# Patient Record
Sex: Male | Born: 1950 | Race: White | Hispanic: No | Marital: Married | Smoking: Former smoker
Health system: Southern US, Community
[De-identification: ages and names within clinical notes are randomized; demographics above are authoritative.]

## PROBLEM LIST (undated history)

## (undated) DIAGNOSIS — N390 Urinary tract infection, site not specified: Secondary | ICD-10-CM

## (undated) DIAGNOSIS — I248 Other forms of acute ischemic heart disease: Secondary | ICD-10-CM

## (undated) DIAGNOSIS — Z87891 Personal history of nicotine dependence: Secondary | ICD-10-CM

## (undated) DIAGNOSIS — K219 Gastro-esophageal reflux disease without esophagitis: Secondary | ICD-10-CM

## (undated) DIAGNOSIS — I2489 Other forms of acute ischemic heart disease: Secondary | ICD-10-CM

## (undated) DIAGNOSIS — N400 Enlarged prostate without lower urinary tract symptoms: Secondary | ICD-10-CM

## (undated) DIAGNOSIS — R1310 Dysphagia, unspecified: Secondary | ICD-10-CM

## (undated) DIAGNOSIS — F1010 Alcohol abuse, uncomplicated: Secondary | ICD-10-CM

## (undated) DIAGNOSIS — Z87898 Personal history of other specified conditions: Secondary | ICD-10-CM

## (undated) DIAGNOSIS — R9439 Abnormal result of other cardiovascular function study: Secondary | ICD-10-CM

## (undated) DIAGNOSIS — I10 Essential (primary) hypertension: Secondary | ICD-10-CM

## (undated) DIAGNOSIS — N189 Chronic kidney disease, unspecified: Secondary | ICD-10-CM

## (undated) HISTORY — PX: CATARACT EXTRACTION: SUR2

## (undated) HISTORY — PX: OTHER SURGICAL HISTORY: SHX169

## (undated) HISTORY — PX: ROTATOR CUFF REPAIR: SHX139

## (undated) HISTORY — DX: Dysphagia, unspecified: R13.10

## (undated) HISTORY — DX: Essential (primary) hypertension: I10

---
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
## 2017-07-15 ENCOUNTER — Encounter (INDEPENDENT_AMBULATORY_CARE_PROVIDER_SITE_OTHER): Payer: Self-pay

## 2017-07-15 ENCOUNTER — Encounter (INDEPENDENT_AMBULATORY_CARE_PROVIDER_SITE_OTHER): Payer: Self-pay | Admitting: Internal Medicine

## 2017-07-24 ENCOUNTER — Encounter (INDEPENDENT_AMBULATORY_CARE_PROVIDER_SITE_OTHER): Payer: Self-pay | Admitting: Internal Medicine

## 2017-07-24 ENCOUNTER — Other Ambulatory Visit (INDEPENDENT_AMBULATORY_CARE_PROVIDER_SITE_OTHER): Payer: Self-pay | Admitting: Internal Medicine

## 2017-07-24 ENCOUNTER — Ambulatory Visit (INDEPENDENT_AMBULATORY_CARE_PROVIDER_SITE_OTHER): Payer: Managed Care, Other (non HMO) | Admitting: Internal Medicine

## 2017-07-24 ENCOUNTER — Encounter (HOSPITAL_COMMUNITY)
Admission: AD | Disposition: A | Discharge status: Home / Self Care | Payer: Self-pay | Source: Ambulatory Visit | Attending: Internal Medicine

## 2017-07-24 ENCOUNTER — Ambulatory Visit (HOSPITAL_COMMUNITY)
Admission: AD | Admit: 2017-07-24 | Discharge: 2017-07-24 | Disposition: A | Discharge status: Home / Self Care | Payer: Managed Care, Other (non HMO) | Source: Ambulatory Visit | Attending: Internal Medicine | Admitting: Internal Medicine

## 2017-07-24 ENCOUNTER — Encounter (HOSPITAL_COMMUNITY): Payer: Self-pay | Admitting: *Deleted

## 2017-07-24 VITALS — BP 130/60 | HR 72 | Temp 98.3°F | Ht 69.0 in | Wt 170.0 lb

## 2017-07-24 DIAGNOSIS — R1319 Other dysphagia: Secondary | ICD-10-CM

## 2017-07-24 DIAGNOSIS — R1314 Dysphagia, pharyngoesophageal phase: Secondary | ICD-10-CM | POA: Diagnosis not present

## 2017-07-24 DIAGNOSIS — Z720 Tobacco use: Secondary | ICD-10-CM | POA: Diagnosis not present

## 2017-07-24 DIAGNOSIS — I10 Essential (primary) hypertension: Secondary | ICD-10-CM | POA: Insufficient documentation

## 2017-07-24 DIAGNOSIS — B3781 Candidal esophagitis: Secondary | ICD-10-CM | POA: Diagnosis not present

## 2017-07-24 DIAGNOSIS — Z79899 Other long term (current) drug therapy: Secondary | ICD-10-CM | POA: Diagnosis not present

## 2017-07-24 DIAGNOSIS — K2950 Unspecified chronic gastritis without bleeding: Secondary | ICD-10-CM | POA: Diagnosis not present

## 2017-07-24 DIAGNOSIS — R1310 Dysphagia, unspecified: Secondary | ICD-10-CM

## 2017-07-24 DIAGNOSIS — K3189 Other diseases of stomach and duodenum: Secondary | ICD-10-CM | POA: Diagnosis not present

## 2017-07-24 DIAGNOSIS — T18128A Food in esophagus causing other injury, initial encounter: Secondary | ICD-10-CM | POA: Diagnosis not present

## 2017-07-24 DIAGNOSIS — K449 Diaphragmatic hernia without obstruction or gangrene: Secondary | ICD-10-CM | POA: Diagnosis not present

## 2017-07-24 DIAGNOSIS — K766 Portal hypertension: Secondary | ICD-10-CM | POA: Insufficient documentation

## 2017-07-24 DIAGNOSIS — K222 Esophageal obstruction: Secondary | ICD-10-CM | POA: Diagnosis not present

## 2017-07-24 DIAGNOSIS — B9681 Helicobacter pylori [H. pylori] as the cause of diseases classified elsewhere: Secondary | ICD-10-CM | POA: Insufficient documentation

## 2017-07-24 DIAGNOSIS — K209 Esophagitis, unspecified: Secondary | ICD-10-CM | POA: Diagnosis not present

## 2017-07-24 DIAGNOSIS — K228 Other specified diseases of esophagus: Secondary | ICD-10-CM | POA: Diagnosis not present

## 2017-07-24 HISTORY — PX: ESOPHAGEAL DILATION: SHX303

## 2017-07-24 HISTORY — PX: ESOPHAGOGASTRODUODENOSCOPY: SHX5428

## 2017-07-24 HISTORY — DX: Essential (primary) hypertension: I10

## 2017-07-24 HISTORY — PX: BIOPSY: SHX5522

## 2017-07-24 HISTORY — DX: Dysphagia, unspecified: R13.10

## 2017-07-24 LAB — KOH PREP

## 2017-07-24 SURGERY — EGD (ESOPHAGOGASTRODUODENOSCOPY)
Anesthesia: Moderate Sedation

## 2017-07-24 MED ORDER — LIDOCAINE VISCOUS 2 % MT SOLN
OROMUCOSAL | Status: DC | PRN
  Administered 2017-07-24: 4 mL via OROMUCOSAL

## 2017-07-24 MED ORDER — STERILE WATER FOR IRRIGATION IR SOLN
Status: DC | PRN
  Administered 2017-07-24: 12:00:00

## 2017-07-24 MED ORDER — MIDAZOLAM HCL 5 MG/5ML IJ SOLN
INTRAMUSCULAR | Status: DC | PRN
  Administered 2017-07-24: 1 mg via INTRAVENOUS
  Administered 2017-07-24 (×3): 2 mg via INTRAVENOUS

## 2017-07-24 MED ORDER — SODIUM CHLORIDE 0.9 % IV SOLN
INTRAVENOUS | Status: DC
  Administered 2017-07-24: 1000 mL via INTRAVENOUS

## 2017-07-24 MED ORDER — MEPERIDINE HCL 50 MG/ML IJ SOLN
INTRAMUSCULAR | Status: AC
  Filled 2017-07-24: qty 1

## 2017-07-24 MED ORDER — LIDOCAINE VISCOUS 2 % MT SOLN
OROMUCOSAL | Status: DC
Start: 2017-07-24 — End: 2017-07-24
  Filled 2017-07-24: qty 15

## 2017-07-24 MED ORDER — PANTOPRAZOLE SODIUM 40 MG PO TBEC: 40.0000 mg | DELAYED_RELEASE_TABLET | Freq: Every day | ORAL | 5 refills | Status: DC

## 2017-07-24 MED ORDER — NYSTATIN 100000 UNIT/ML MT SUSP: 5.0000 mL | Freq: Four times a day (QID) | OROMUCOSAL | 1 refills | Status: DC

## 2017-07-24 MED ORDER — MEPERIDINE HCL 50 MG/ML IJ SOLN
INTRAMUSCULAR | Status: DC | PRN
  Administered 2017-07-24 (×2): 25 mg via INTRAVENOUS

## 2017-07-24 MED ORDER — MIDAZOLAM HCL 5 MG/5ML IJ SOLN
INTRAMUSCULAR | Status: AC
  Filled 2017-07-24: qty 10

## 2017-07-24 NOTE — Patient Instructions (Signed)
The risks of bleeding, perforation and infection were reviewed with patient.  

## 2017-07-24 NOTE — Progress Notes (Signed)
   Subjective:    Patient ID: Phillip Kirk, male    DOB: 03-27-1951, 66 y.o.   MRN: [id number]  HPI Referred by Aggie Hacker PA for dysphagia.  Symptoms for about a year. Progressively worsens. He says every time he eats, foods lodge in his esophagus. When the food lodges, he cannot even swallow water.  His appetite is good. He has lost about 9 pounds intentionally. If he chews steak or chicken too much, the meat will lodge.  BMs move normal. No melena.  Last colonoscopy 3-4 yrs ago and he reports was normal. Works at [employer].  This morning he ate grits and eggs without any problem. Was unable to tolerate the Omeprazole. Stated it made his symptoms worse.      Review of Systems Past Medical History:  Diagnosis Date  . Dysphagia 07/24/2017  . Essential hypertension, benign 07/24/2017    Past Surgical History:  Procedure Laterality Date  . CATARACT EXTRACTION    . rt eye surgery     trauma    No Known Allergies  No current outpatient prescriptions on file prior to visit.   No current facility-administered medications on file prior to visit.    Current Outpatient Prescriptions  Medication Sig Dispense Refill  . amLODipine (NORVASC) 5 MG tablet Take 5 mg by mouth daily.    [name] lisinopril (PRINIVIL,ZESTRIL) 20 MG tablet Take 20 mg by mouth daily.     No current facility-administered medications for this visit.         Objective:   Physical Exam Blood pressure 130/60, pulse 72, temperature 98.3 F (36.8 C), height 5\' 9"  (1.753 m), weight 170 lb (77.1 kg). Alert and oriented. Skin warm and dry. Oral mucosa is moist.   . Sclera anicteric, conjunctivae is pink. Thyroid not enlarged. No cervical lymphadenopathy. Lungs clear. Heart regular rate and rhythm.  Abdomen is soft. Bowel sounds are positive. No hepatomegaly. No abdominal masses felt. No tenderness.  No edema to lower extremities.           Assessment & Plan:  Solid food dysphagia. . Esophageal stricture needs  to be ruled out.  Samples of Dexilant given to patient.  Will schedule and EGD/ED. I discussed with Dr Laural Golden. (today)

## 2017-07-24 NOTE — Discharge Instructions (Signed)
No aspirin or NSAIDs for 3 days. Resume usual medications as before. Mycostatin suspension 500,000 unit until prescription runs out. Pantoprazole 40 mg by mouth 30 minutes before breakfast daily. Mechanical soft diet. No driving for 24 hours. Physician will call with biopsy results.   Upper Endoscopy, Care After Refer to this sheet in the next few weeks. These instructions provide you with information about caring for yourself after your procedure. Your health care provider may also give you more specific instructions. Your treatment has been planned according to current medical practices, but problems sometimes occur. Call your health care provider if you have any problems or questions after your procedure. What can I expect after the procedure? After the procedure, it is common to have:  A sore throat.  Bloating.  Nausea.  Follow these instructions at home:  Follow instructions from your health care provider about what to eat or drink after your procedure.  Return to your normal activities as told by your health care provider. Ask your health care provider what activities are safe for you.  Take over-the-counter and prescription medicines only as told by your health care provider.  Do not drive for 24 hours if you received a sedative.  Keep all follow-up visits as told by your health care provider. This is important. Contact a health care provider if:  You have a sore throat that lasts longer than one day.  You have trouble swallowing. Get help right away if:  You have a fever.  You vomit blood or your vomit looks like coffee grounds.  You have bloody, black, or tarry stools.  You have a severe sore throat or you cannot swallow.  You have difficulty breathing.  You have severe pain in your chest or belly. This information is not intended to replace advice given to you by your health care provider. Make sure you discuss any questions you have with your health care  provider. Document Released: 03/23/2012 Document Revised: 02/28/2016 Document Reviewed: 07/05/2015 Elsevier Interactive Patient Education  2017 [place] Meal Plan A soft-food meal plan includes foods that are safe and easy to swallow. This meal plan typically is used:  If you are having trouble chewing or swallowing foods.  As a transition meal plan after only having had liquid meals for a long period.  What do I need to know about the soft-food meal plan? A soft-food meal plan includes tender foods that are soft and easy to chew and swallow. In most cases, bite-sized pieces of food are easier to swallow. A bite-sized piece is about  inch or smaller. Foods in this plan do not need to be ground or pureed. Foods that are very hard, crunchy, or sticky should be avoided. Also, breads, cereals, yogurts, and desserts with nuts, seeds, or fruits should be avoided. What foods can I eat? Grains Rice and wild rice. Moist bread, dressing, pasta, and noodles. Well-moistened dry or cooked cereals, such as farina (cooked wheat cereal), oatmeal, or grits. Biscuits, breads, muffins, pancakes, and waffles that have been well moistened. Vegetables Shredded lettuce. Cooked, tender vegetables, including potatoes without skins. Vegetable juices. Broths or creamed soups made with vegetables that are not stringy or chewy. Strained tomatoes (without seeds). Fruits Canned or well-cooked fruits. Soft (ripe), peeled fresh fruits, such as peaches, nectarines, kiwi, cantaloupe, honeydew melon, and watermelon (without seeds). Soft berries with small seeds, such as strawberries. Fruit juices (without pulp). Meats and Other Protein Sources Moist, tender, lean beef. Mutton. Lamb. Veal. Chicken. [place].  Liver. Ham. Fish without bones. Eggs. Dairy Milk, milk drinks, and cream. Plain cream cheese and cottage cheese. Plain yogurt. Sweets/Desserts Flavored gelatin desserts. Custard. Plain ice cream, frozen  yogurt, sherbet, milk shakes, and malts. Plain cakes and cookies. Plain hard candy. Other Butter, margarine (without trans fat), and cooking oils. Mayonnaise. Cream sauces. Mild spices, salt, and sugar. Syrup, molasses, honey, and jelly. The items listed above may not be a complete list of recommended foods or beverages. Contact your dietitian for more options. What foods are not recommended? Grains Dry bread, toast, crackers that have not been moistened. Coarse or dry cereals, such as bran, granola, and shredded wheat. Tough or chewy crusty breads, such as [place] bread or baguettes. Vegetables Corn. Raw vegetables except shredded lettuce. Cooked vegetables that are tough or stringy. Tough, crisp, fried potatoes and potato skins. Fruits Fresh fruits with skins or seeds or both, such as apples, pears, or grapes. Stringy, high-pulp fruits, such as papaya, pineapple, coconut, or mango. Fruit leather, fruit roll-ups, and all dried fruits. Meats and Other Protein Sources Sausages and hot dogs. Meats with gristle. Fish with bones. Nuts, seeds, and chunky peanut or other nut butters. Sweets/Desserts Cakes or cookies that are very dry or chewy. The items listed above may not be a complete list of foods and beverages to avoid. Contact your dietitian for more information. This information is not intended to replace advice given to you by your health care provider. Make sure you discuss any questions you have with your health care provider. Document Released: 12/30/2007 Document Revised: 02/28/2016 Document Reviewed: 08/19/2013 Elsevier Interactive Patient Education  2017 [employer].

## 2017-07-24 NOTE — Op Note (Signed)
[facility] Patient Name: Phillip Kirk Procedure Date: 07/24/2017 11:32 AM MRN: [id number] Date of Birth: 10-Jul-1951 Attending MD: Hildred Laser , MD CSN: [id number] Age: 66 Admit Type: Outpatient Procedure:                Upper GI endoscopy Indications:              Esophageal dysphagia Providers:                Hildred Laser, MD, [place]is [place] B. Sharon Seller, RN, Aram Candela Referring MD:             Grace Bushy. Luciana Axe PA, PA Medicines:                Lidocaine spray, Meperidine 50 mg IV, Midazolam 7                            mg IV Complications:            No immediate complications. Estimated Blood Loss:     Estimated blood loss was minimal. Procedure:                Pre-Anesthesia Assessment:                           - Prior to the procedure, a History and Physical                            was performed, and patient medications and                            allergies were reviewed. The patient's tolerance of                            previous anesthesia was also reviewed. The risks                            and benefits of the procedure and the sedation                            options and risks were discussed with the patient.                            All questions were answered, and informed consent                            was obtained. Prior Anticoagulants: The patient has                            taken no previous anticoagulant or antiplatelet                            agents. ASA Grade Assessment: I - A normal, healthy  patient. After reviewing the risks and benefits,                            the patient was deemed in satisfactory condition to                            undergo the procedure.                           After obtaining informed consent, the endoscope was                            passed under direct vision. Throughout the                            procedure, the patient's blood pressure,  pulse, and                            oxygen saturations were monitored continuously. The                            EG-[id number] ([id number]) scope was introduced through the                            mouth, and advanced to the second part of duodenum.                            The upper GI endoscopy was accomplished without                            difficulty. The patient tolerated the procedure                            well. Scope In: 11:52:13 AM Scope Out: 12:08:37 PM Total Procedure Duration: 0 hours 16 minutes 24 seconds  Findings:      One severe benign-appearing, intrinsic stenosis was found 17 to 18 cm       from the incisors. This measured 9 mm (inner diameter) x 1 cm (in       length) and was traversed after dilation. A TTS dilator was passed       through the scope. Dilation with a 12-13.5-15 mm balloon dilator was       performed to 12 mm and 13.5 mm. The dilation site was examined and       showed moderate improvement in luminal narrowing and no perforation.      Localized candidiasis was found in the proximal esophagus. Cells for       cytology were obtained by brushing.      Mucosal changes including ringed esophagus, longitudinal furrows,       small-caliber esophagus and white plaques were found in the proximal       esophagus and in the mid esophagus. Biopsies were taken with a cold       forceps for histology.      The Z-line was irregular and was found 38 cm from the incisors. Biopsies       were taken  with a cold forceps for histology.      A 2 cm hiatal hernia was present.      Mild portal hypertensive gastropathy was found in the gastric fundus.      A few erosions were found in the gastric antrum. Biopsies were taken       with a cold forceps for histology.      The duodenal bulb and second portion of the duodenum were normal. Impression:               - Benign-appearing highgrade proximal esophageal                            stenosis. Dilated to 13.5 mm.                            - Monilial esophagitis. Brushing taken for KOH.                           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 Biopsied.                           - Z-line irregular, 38 cm from the incisors.                            Biopsied.                           - 2 cm hiatal hernia.                           - Portal hypertensive gastropathy.                           - Erosive gastropathy. Biopsied.                           - Normal duodenal bulb and second portion of the                            duodenum. Moderate Sedation:      Moderate (conscious) sedation was administered by the endoscopy nurse       and supervised by the endoscopist. The following parameters were       monitored: oxygen saturation, heart rate, blood pressure, CO2       capnography and response to care. Total physician intraservice time was       21 minutes. Recommendation:           - Patient has a contact number available for                            emergencies. The signs and symptoms of potential                            delayed complications were discussed with the  patient. Return to normal activities tomorrow.                            Written discharge instructions were provided to the                            patient.                           - Mechanical soft diet today.                           - Continue present medications.                           - No aspirin, ibuprofen, naproxen, or other                            non-steroidal anti-inflammatory drugs for 3 days.                           - Await pathology results.                           - pantoprazole mg by mouthevery morning.                           - Mycostatin suspension500,000 units swish and                            swallow 4 times a day for 10 days.                           - Repeat upper endoscopy PRN. Procedure  Code(s):        --- Professional ---                           [phone], Esophagogastroduodenoscopy, flexible,                            transoral; with transendoscopic balloon dilation of                            esophagus (less than 30 mm diameter)                           43239, Esophagogastroduodenoscopy, flexible,                            transoral; with biopsy, single or multiple                           99152, Moderate sedation services provided by the                            same physician or other qualified health care  professional performing the diagnostic or                            therapeutic service that the sedation supports,                            requiring the presence of an independent trained                            observer to assist in the monitoring of the                            patient's level of consciousness and physiological                            status; initial 15 minutes of intraservice time,                            patient age 45 years or older Diagnosis Code(s):        --- Professional ---                           K22.2, Esophageal obstruction                           B37.81, Candidal esophagitis                           K20.9, Esophagitis, unspecified                           K22.8, Other specified diseases of esophagus                           K44.9, Diaphragmatic hernia without obstruction or                            gangrene                           K76.6, Portal hypertension                           K31.89, Other diseases of stomach and duodenum                           R13.14, Dysphagia, pharyngoesophageal phase CPT copyright 2016 American Medical Association. All rights reserved. The codes documented in this report are preliminary and upon coder review may  be revised to meet current compliance requirements. Hildred Laser, MD Hildred Laser, MD 07/24/2017 12:24:37 PM This report has been  signed electronically. Number of Addenda: 0

## 2017-07-24 NOTE — H&P (Signed)
Phillip Kirk is an 66 y.o. male.   Chief Complaint: Patient is here for EGD and ED. HPI: patient is 66 year old Caucasian male who presents with one-year history of intermittent solid food dysphagia. Dysphagia has gotten worse lately. Now he is having difficulty every time he swallows solids. He points to suprasternal area site of bolus obstruction. He stateshe has regurgitated food multiple times to get relief. Usually go down.He has intermittent heartburn and takes OTC ranitidine two her three times a week. He has lost 9 pounds voluntarily. He denieshematemesis or melena.  Past Medical History:  Diagnosis Date  . Dysphagia 07/24/2017  . Essential hypertension, benign 07/24/2017    Past Surgical History:  Procedure Laterality Date  . CATARACT EXTRACTION    . rt eye surgery     trauma    Family History  Problem Relation Age of Onset  . Alzheimer's disease Sister   . Breast cancer Sister   . Breast cancer Sister    Social History:  reports that he has quit smoking. His smokeless tobacco use includes Chew. He reports that he drinks alcohol. He reports that he does not use drugs.  Allergies: No Known Allergies  Medications Prior to Admission  Medication Sig Dispense Refill  . amLODipine (NORVASC) 5 MG tablet Take 5 mg by mouth daily.    [name] lisinopril (PRINIVIL,ZESTRIL) 20 MG tablet Take 20 mg by mouth daily.      No results found for this or any previous visit (from the past 48 hour(s)). No results found.  ROS  Blood pressure 129/79, pulse 63, temperature 98.3 F (36.8 C), temperature source Oral, resp. rate 12, height 5\' 9"  (1.753 m), weight 170 lb (77.1 kg), SpO2 96 %. Physical Exam  Constitutional: He appears well-developed and well-nourished.  HENT:  Mouth/Throat: Oropharynx is clear and moist.  Patient is edentulous. He has upper and lower dentures.  Eyes: Conjunctivae are normal. No scleral icterus.  Neck: No thyromegaly present.  Cardiovascular: Normal rate, regular  rhythm and normal heart sounds.   No murmur heard. Respiratory: Effort normal and breath sounds normal.  GI: Soft. He exhibits no distension and no mass. There is no tenderness.  Musculoskeletal: He exhibits no edema.  Lymphadenopathy:    He has no cervical adenopathy.  Neurological: He is alert.  Skin: Skin is warm and dry.     Assessment/Plan Solid food dysphagia. EGD with ED.  Hildred Laser, MD 07/24/2017, 11:43 AM

## 2017-07-24 NOTE — Progress Notes (Signed)
Phillip Kirk was at [facility] on 07/24/17 for a procedure and cannot return to work until 07/25/17 at 3:00 PM.

## 2017-07-28 ENCOUNTER — Encounter (HOSPITAL_COMMUNITY): Payer: Self-pay | Admitting: Internal Medicine

## 2017-08-04 ENCOUNTER — Telehealth (INDEPENDENT_AMBULATORY_CARE_PROVIDER_SITE_OTHER): Payer: Self-pay | Admitting: Internal Medicine

## 2017-08-04 NOTE — Telephone Encounter (Signed)
Patient's spouse Angie called, stated that they missed a call last week, she's not sure who called.  Maybe for results?  She thinks he is supposed to have a repeat x-ray or procedure in two months as well.  [phone]

## 2017-08-05 NOTE — Telephone Encounter (Signed)
Dr.Rehman had tried to call the patient 3 times. No answer. I have spoken with the patient and made him aware of this. I let Dr.Rehman know that the patient can now be reached.

## 2017-08-10 ENCOUNTER — Telehealth (INDEPENDENT_AMBULATORY_CARE_PROVIDER_SITE_OTHER): Payer: Self-pay | Admitting: Internal Medicine

## 2017-08-10 ENCOUNTER — Encounter (INDEPENDENT_AMBULATORY_CARE_PROVIDER_SITE_OTHER): Payer: Self-pay | Admitting: *Deleted

## 2017-08-10 ENCOUNTER — Other Ambulatory Visit (INDEPENDENT_AMBULATORY_CARE_PROVIDER_SITE_OTHER): Payer: Self-pay | Admitting: Internal Medicine

## 2017-08-10 ENCOUNTER — Other Ambulatory Visit (INDEPENDENT_AMBULATORY_CARE_PROVIDER_SITE_OTHER): Payer: Self-pay | Admitting: *Deleted

## 2017-08-10 DIAGNOSIS — K222 Esophageal obstruction: Secondary | ICD-10-CM

## 2017-08-10 MED ORDER — BIS SUBCIT-METRONID-TETRACYC 140-125-125 MG PO CAPS
3.0000 | ORAL_CAPSULE | Freq: Three times a day (TID) | ORAL | 0 refills | Status: DC
Start: 2017-08-10 — End: 2017-10-19

## 2017-08-10 NOTE — Telephone Encounter (Signed)
Forwarded to DR.Rehman

## 2017-08-10 NOTE — Telephone Encounter (Signed)
Patient's spouse called, stated that no one has called her spouse about his results.  I spoke to [place].  I called that patient back, spoke to his spouse and informed her that Dr. Laural Golden had attempted to call the patient multiple times and was not able to get him.  I also reminded her that Tammy had spoken to the patient and told him to stay right there that Dr. Laural Golden was going to call him.  She stated that they had to go out that's why.  She prefers Dr. Laural Golden call the cell number first then the home number.  Cell [place]

## 2017-08-10 NOTE — Telephone Encounter (Signed)
Finally I was able to reach patient's wife today. All results reviewed with her. Esophageal biopsy negative for EoE. Patient is able to swallow much better. Stricture was only dilated to 16.5 mm. Prescription for Pylera sent to patient's pharmacy in [facility]. Repeat EGD with EGD in 3 months or earlier if he becomes symptomatic. Report to PCP.

## 2017-08-10 NOTE — Telephone Encounter (Signed)
EGD/ED sch'd 10/28/16, letter mailed to patient

## 2017-08-11 ENCOUNTER — Ambulatory Visit (INDEPENDENT_AMBULATORY_CARE_PROVIDER_SITE_OTHER): Payer: Self-pay | Admitting: Internal Medicine

## 2017-08-19 ENCOUNTER — Telehealth (INDEPENDENT_AMBULATORY_CARE_PROVIDER_SITE_OTHER): Payer: Self-pay | Admitting: *Deleted

## 2017-08-19 NOTE — Telephone Encounter (Signed)
1 week ago I talked with Dorothea Ogle @ [facility] about the Rx for the Pylera , it was to expensive for the patient and we needed to change it to something else. He took the following: Prilosec 20 mg BID , Amoxicillin 1 gram BID and Metronidazole BID for 14 days of treatment.  He was going to call the patient when the Rx was ready and would call me back if there was a problem.  Today I get a message from a family representative that they never got a call from [facility]. Returned a call to [facility] he tells me that he was told that they could not break up the Pylera Rx that way.  I explained that I had called the Rx in as listed above. He retook the RX and was going to call the patient when ready and would call me if there was a problem with the cost.  Patient called and made aware.

## 2017-10-01 ENCOUNTER — Other Ambulatory Visit: Payer: Self-pay | Admitting: Sports Medicine

## 2017-10-01 DIAGNOSIS — M25512 Pain in left shoulder: Secondary | ICD-10-CM

## 2017-10-08 ENCOUNTER — Ambulatory Visit
Admission: RE | Admit: 2017-10-08 | Discharge: 2017-10-08 | Disposition: A | Discharge status: Home / Self Care | Payer: Managed Care, Other (non HMO) | Source: Ambulatory Visit | Attending: Sports Medicine | Admitting: Sports Medicine

## 2017-10-08 DIAGNOSIS — M25512 Pain in left shoulder: Secondary | ICD-10-CM

## 2017-10-29 ENCOUNTER — Ambulatory Visit (INDEPENDENT_AMBULATORY_CARE_PROVIDER_SITE_OTHER): Payer: Managed Care, Other (non HMO) | Admitting: Cardiovascular Disease

## 2017-10-29 ENCOUNTER — Encounter: Payer: Self-pay | Admitting: *Deleted

## 2017-10-29 ENCOUNTER — Encounter: Payer: Self-pay | Admitting: Cardiovascular Disease

## 2017-10-29 VITALS — BP 122/64 | HR 73 | Ht 69.0 in | Wt 178.0 lb

## 2017-10-29 DIAGNOSIS — G4730 Sleep apnea, unspecified: Secondary | ICD-10-CM | POA: Diagnosis not present

## 2017-10-29 DIAGNOSIS — Z01818 Encounter for other preprocedural examination: Secondary | ICD-10-CM

## 2017-10-29 DIAGNOSIS — R9431 Abnormal electrocardiogram [ECG] [EKG]: Secondary | ICD-10-CM

## 2017-10-29 DIAGNOSIS — I10 Essential (primary) hypertension: Secondary | ICD-10-CM

## 2017-10-29 NOTE — Progress Notes (Signed)
CARDIOLOGY CONSULT NOTE  Patient ID: Phillip Kirk MRN: [id number] DOB/AGE: 1951-04-26 67 y.o.  Admit date: (Not on file) Primary Physician: Dr. Consuello Masse Referring Physician: Dr. Consuello Masse  Reason for Consultation: Abnormal ECG  HPI: Phillip Kirk is a 67 y.o. male who is being seen today for the evaluation of abnormal ECG at the request of Dr. Consuello Masse.  Past medical history includes hypertension.  He is being scheduled for left rotator cuff repair.  He says PCP yesterday.  I reviewed all relevant documentation, labs, and studies.  He reportedly had chest pain about 2 years ago which concerned him that he may have had a heart attack.  He now carries nitroglycerin with him.  He denies chest pain at this time.  I personally reviewed an ECG performed on 10/28/17, which demonstrated sinus rhythm with nonspecific Q waves and probable micro-R waves in leads III and aVF with possible Q waves in leads V2 and V3.  There are no significant ST segment or T wave abnormalities.  The faxed tracing is not optimal.  I reviewed labs performed on 07/08/17: BUN 13, creatinine 0.94, sodium 138, potassium 4.4, white blood cell 6, hemoglobin 15.7, platelets 254.  He told me he went to an urgent care in [place] when he had significant chest pain.  Systolic blood pressure was reportedly in the 200 range.  At that time he was started on amlodipine 5 mg daily.  He has had no chest pain since that time.  He and his wife walk about 8-10 laps on the local track.  He also does a lot of walking at work.  He denies exertional chest pain and shortness of breath.  He also denies leg swelling, orthopnea, palpitations, paroxysmal nocturnal dyspnea, lightheadedness, dizziness, and syncope.  He and his wife were present for this appointment.  His wife says that he stops breathing in his sleep multiple times at night and she has to awaken him.  His primary complaints relate to left shoulder pain which keeps him  up at night.  He cannot sleep on his left side.  He sometimes uses a sling but it can pull on the right side of his neck and so he has stopped using it.   No Known Allergies  Current Outpatient Medications  Medication Sig Dispense Refill  . acetaminophen (TYLENOL) 500 MG tablet Take 1,000 mg by mouth every 6 (six) hours as needed for moderate pain or headache.    [name] amLODipine (NORVASC) 5 MG tablet Take 5 mg by mouth daily.    . finasteride (PROSCAR) 5 MG tablet Take 5 mg by mouth daily.    [name] lisinopril (PRINIVIL,ZESTRIL) 40 MG tablet Take 40 mg by mouth daily.     . pantoprazole (PROTONIX) 40 MG tablet Take 1 tablet (40 mg total) by mouth daily before breakfast. 30 tablet 5   No current facility-administered medications for this visit.     Past Medical History:  Diagnosis Date  . Dysphagia 07/24/2017  . Essential hypertension, benign 07/24/2017    Past Surgical History:  Procedure Laterality Date  . BIOPSY  07/24/2017   Procedure: BIOPSY;  Surgeon: Rogene Houston, MD;  [facility];  Service: Endoscopy;;  gastric esophagus  . CATARACT EXTRACTION    . ESOPHAGEAL DILATION N/A 07/24/2017   Procedure: ESOPHAGEAL DILATION;  Surgeon: Rogene Houston, MD;  Location: [facility];  Service: Endoscopy;  Laterality: N/A;  . ESOPHAGOGASTRODUODENOSCOPY N/A 07/24/2017   Procedure:  ESOPHAGOGASTRODUODENOSCOPY (EGD);  Surgeon: Rogene Houston, MD;  Location: [facility];  Service: Endoscopy;  Laterality: N/A;  . rt eye surgery     trauma    Social History   Socioeconomic History  . Marital status: Married    Spouse name: Not on file  . Number of children: Not on file  . Years of education: Not on file  . Highest education level: Not on file  Social Needs  . Financial resource strain: Not on file  . Food insecurity - worry: Not on file  . Food insecurity - inability: Not on file  . Transportation needs - medical: Not on file  . Transportation needs - non-medical: Not  on file  Occupational History  . Not on file  Tobacco Use  . Smoking status: Former Research scientist (life sciences)  . Smokeless tobacco: Current User    Types: Chew  Substance and Sexual Activity  . Alcohol use: Yes    Comment: occasonally  . Drug use: No  . Sexual activity: Not on file  Other Topics Concern  . Not on file  Social History Narrative  . Not on file     No family history of premature CAD in 1st degree relatives.  Current Meds  Medication Sig  . acetaminophen (TYLENOL) 500 MG tablet Take 1,000 mg by mouth every 6 (six) hours as needed for moderate pain or headache.  [name] amLODipine (NORVASC) 5 MG tablet Take 5 mg by mouth daily.  . finasteride (PROSCAR) 5 MG tablet Take 5 mg by mouth daily.  [name] lisinopril (PRINIVIL,ZESTRIL) 40 MG tablet Take 40 mg by mouth daily.   . pantoprazole (PROTONIX) 40 MG tablet Take 1 tablet (40 mg total) by mouth daily before breakfast.      Review of systems complete and found to be negative unless listed above in HPI    Physical exam Blood pressure 122/64, pulse 73, height 5\' 9"  (1.753 m), weight 178 lb (80.7 kg), SpO2 98 %. General: NAD Neck: No JVD, no thyromegaly or thyroid nodule.  Lungs: Clear to auscultation bilaterally with normal respiratory effort. CV: Nondisplaced PMI. Regular rate and rhythm, normal S1/S2, no W7/P7, soft 1/6 systolic murmur over right upper sternal border.  No peripheral edema.  No carotid bruit.    Abdomen: Soft, nontender, no distention.  Skin: Intact without lesions or rashes.  Neurologic: Alert and oriented x 3.  Psych: Normal affect. Extremities: No clubbing or cyanosis.  HEENT: Normal.   ECG: Most recent ECG reviewed.   Labs: No results found for: K, BUN, CREATININE, ALT, TSH, HGB   Lipids: No results found for: LDLCALC, LDLDIRECT, CHOL, TRIG, HDL      ASSESSMENT AND PLAN:  1.  Abnormal ECG: There are some nonspecific findings on his ECG.  He has good exercise tolerance and can achieve 4 METS without  difficulty.  There was an episode of chest pain 2 years ago which may have been related to severe hypertension.  He is being scheduled to undergo left rotator cuff repair.  I will obtain an exercise Myoview stress test for further clarification.  2.  Hypertension: Controlled on present therapy which includes amlodipine 5 mg and lisinopril 40 mg.  No changes to therapy.  3.  Sleep disordered breathing: He likely has sleep apnea.  I will arrange for a sleep study.  I informed him that sleep apnea is an independent risk factor for cardiac arrhythmias, myocardial infarction, stroke, and is a causative factor for uncontrolled hypertension.  Disposition: Follow up in 3 months  Signed: Kate Sable, M.D., F.A.C.C.  10/29/2017, 3:38 PM

## 2017-10-29 NOTE — Patient Instructions (Addendum)
Medication Instructions:  Continue all current medications.  Labwork: none  Testing/Procedures:  Your physician has requested that you have en exercise stress myoview. For further information please visit [email or web address]. Please follow instruction sheet, as given.  Office will contact with results via phone or letter.    Follow-Up: 3 months   Any Other Special Instructions Will Be Listed Below (If Applicable). You have been referred to:  [facility]  If you need a refill on your cardiac medications before your next appointment, please call your pharmacy.

## 2017-10-30 ENCOUNTER — Telehealth: Payer: Self-pay | Admitting: Cardiovascular Disease

## 2017-10-30 NOTE — Telephone Encounter (Signed)
Pre-cert Verification for the following procedure   Exercise Myoview scheduled for 11/06/17 at [facility]

## 2017-11-05 ENCOUNTER — Encounter (HOSPITAL_COMMUNITY): Payer: Managed Care, Other (non HMO)

## 2017-11-06 ENCOUNTER — Encounter (HOSPITAL_COMMUNITY)
Admission: RE | Admit: 2017-11-06 | Discharge: 2017-11-06 | Disposition: A | Discharge status: Home / Self Care | Payer: Managed Care, Other (non HMO) | Source: Ambulatory Visit | Attending: Cardiovascular Disease | Admitting: Cardiovascular Disease

## 2017-11-06 ENCOUNTER — Encounter (HOSPITAL_COMMUNITY): Payer: Managed Care, Other (non HMO)

## 2017-11-06 ENCOUNTER — Encounter (HOSPITAL_COMMUNITY): Payer: Self-pay

## 2017-11-06 DIAGNOSIS — R9431 Abnormal electrocardiogram [ECG] [EKG]: Secondary | ICD-10-CM | POA: Insufficient documentation

## 2017-11-06 DIAGNOSIS — Z01818 Encounter for other preprocedural examination: Secondary | ICD-10-CM | POA: Diagnosis present

## 2017-11-06 LAB — NM MYOCAR MULTI W/SPECT W/WALL MOTION / EF
CHL CUP MPHR: 154 {beats}/min
CHL CUP NUCLEAR SRS: 0
CHL CUP NUCLEAR SSS: 6
CHL RATE OF PERCEIVED EXERTION: 9
CSEPED: 7 min
CSEPEDS: 13 s
CSEPHR: 88 %
Estimated workload: 7 METS
LV dias vol: 90 mL (ref 62–150)
LV sys vol: 28 mL
NUC STRESS TID: 1.1
Peak HR: 136 {beats}/min
RATE: 0.41
Rest HR: 70 {beats}/min
SDS: 6

## 2017-11-06 MED ORDER — SODIUM CHLORIDE 0.9% FLUSH
INTRAVENOUS | Status: AC
  Administered 2017-11-06: 10 mL via INTRAVENOUS
  Filled 2017-11-06: qty 10

## 2017-11-06 MED ORDER — REGADENOSON 0.4 MG/5ML IV SOLN
INTRAVENOUS | Status: AC
  Filled 2017-11-06: qty 5

## 2017-11-06 MED ORDER — TECHNETIUM TC 99M TETROFOSMIN IV KIT
10.0000 | PACK | Freq: Once | INTRAVENOUS | Status: AC | PRN
  Administered 2017-11-06: 10 via INTRAVENOUS

## 2017-11-06 MED ORDER — TECHNETIUM TC 99M TETROFOSMIN IV KIT
30.0000 | PACK | Freq: Once | INTRAVENOUS | Status: AC | PRN
  Administered 2017-11-06: 30 via INTRAVENOUS

## 2017-11-10 ENCOUNTER — Telehealth: Payer: Self-pay | Admitting: *Deleted

## 2017-11-10 NOTE — Telephone Encounter (Signed)
Notes recorded by Laurine Blazer, LPN on 12/08/8249 at 8:98 AM EST Patient notified. Copy to pmd. Follow up scheduled for April with Dr. Bronson Ing. ------  Notes recorded by Laurine Blazer, LPN on 01/05/1030 at 2:81 PM EST Left message to return call.  ------  Notes recorded by Herminio Commons, MD on 11/09/2017 at 3:00 PM EST He can proceed with surgery. April is fine. ------  Notes recorded by Laurine Blazer, LPN on 10/14/8675 at 3:73 PM EST Patient follow up is not until 01/28/2018. Does he need to be sooner & is he clear for left shoulder scope rotator cuff repair. ------  Notes recorded by Herminio Commons, MD on 11/06/2017 at 1:59 PM EST There are findings suggestive of a possible blockage, but overall it was a low risk study. I will discuss at fu ov.

## 2017-11-18 ENCOUNTER — Ambulatory Visit (HOSPITAL_COMMUNITY)
Admission: RE | Admit: 2017-11-18 | Payer: Managed Care, Other (non HMO) | Source: Ambulatory Visit | Admitting: Internal Medicine

## 2017-11-19 ENCOUNTER — Encounter (HOSPITAL_COMMUNITY)
Admission: RE | Disposition: A | Discharge status: Home / Self Care | Payer: Self-pay | Source: Ambulatory Visit | Attending: Internal Medicine

## 2017-11-19 ENCOUNTER — Other Ambulatory Visit: Payer: Self-pay

## 2017-11-19 ENCOUNTER — Encounter (HOSPITAL_COMMUNITY): Payer: Self-pay | Admitting: *Deleted

## 2017-11-19 ENCOUNTER — Ambulatory Visit (HOSPITAL_COMMUNITY)
Admission: RE | Admit: 2017-11-19 | Discharge: 2017-11-19 | Disposition: A | Discharge status: Home / Self Care | Payer: Managed Care, Other (non HMO) | Source: Ambulatory Visit | Attending: Internal Medicine | Admitting: Internal Medicine

## 2017-11-19 ENCOUNTER — Encounter (HOSPITAL_COMMUNITY): Admission: RE | Payer: Self-pay | Source: Ambulatory Visit

## 2017-11-19 DIAGNOSIS — B3781 Candidal esophagitis: Secondary | ICD-10-CM | POA: Insufficient documentation

## 2017-11-19 DIAGNOSIS — K219 Gastro-esophageal reflux disease without esophagitis: Secondary | ICD-10-CM | POA: Diagnosis not present

## 2017-11-19 DIAGNOSIS — K228 Other specified diseases of esophagus: Secondary | ICD-10-CM | POA: Diagnosis not present

## 2017-11-19 DIAGNOSIS — Z79899 Other long term (current) drug therapy: Secondary | ICD-10-CM | POA: Insufficient documentation

## 2017-11-19 DIAGNOSIS — K3189 Other diseases of stomach and duodenum: Secondary | ICD-10-CM | POA: Diagnosis not present

## 2017-11-19 DIAGNOSIS — R1314 Dysphagia, pharyngoesophageal phase: Secondary | ICD-10-CM | POA: Diagnosis not present

## 2017-11-19 DIAGNOSIS — Z9842 Cataract extraction status, left eye: Secondary | ICD-10-CM | POA: Insufficient documentation

## 2017-11-19 DIAGNOSIS — K449 Diaphragmatic hernia without obstruction or gangrene: Secondary | ICD-10-CM

## 2017-11-19 DIAGNOSIS — I10 Essential (primary) hypertension: Secondary | ICD-10-CM | POA: Insufficient documentation

## 2017-11-19 DIAGNOSIS — Z820 Family history of epilepsy and other diseases of the nervous system: Secondary | ICD-10-CM | POA: Diagnosis not present

## 2017-11-19 DIAGNOSIS — Z87891 Personal history of nicotine dependence: Secondary | ICD-10-CM | POA: Insufficient documentation

## 2017-11-19 DIAGNOSIS — K222 Esophageal obstruction: Secondary | ICD-10-CM | POA: Diagnosis not present

## 2017-11-19 DIAGNOSIS — Z803 Family history of malignant neoplasm of breast: Secondary | ICD-10-CM | POA: Diagnosis not present

## 2017-11-19 HISTORY — DX: Gastro-esophageal reflux disease without esophagitis: K21.9

## 2017-11-19 HISTORY — PX: ESOPHAGEAL DILATION: SHX303

## 2017-11-19 HISTORY — PX: ESOPHAGOGASTRODUODENOSCOPY: SHX5428

## 2017-11-19 SURGERY — EGD (ESOPHAGOGASTRODUODENOSCOPY)
Anesthesia: Moderate Sedation

## 2017-11-19 MED ORDER — MIDAZOLAM HCL 5 MG/5ML IJ SOLN
INTRAMUSCULAR | Status: DC | PRN
  Administered 2017-11-19 (×2): 2 mg via INTRAVENOUS
  Administered 2017-11-19 (×4): 1 mg via INTRAVENOUS
  Administered 2017-11-19: 2 mg via INTRAVENOUS

## 2017-11-19 MED ORDER — NYSTATIN 100000 UNIT/ML MT SUSP: 5.0000 mL | Freq: Four times a day (QID) | OROMUCOSAL | 0 refills | Status: DC

## 2017-11-19 MED ORDER — SODIUM CHLORIDE 0.9 % IV SOLN
INTRAVENOUS | Status: DC
  Administered 2017-11-19: 07:00:00 via INTRAVENOUS

## 2017-11-19 MED ORDER — MEPERIDINE HCL 50 MG/ML IJ SOLN
INTRAMUSCULAR | Status: DC | PRN
  Administered 2017-11-19 (×2): 25 mg

## 2017-11-19 MED ORDER — MIDAZOLAM HCL 5 MG/5ML IJ SOLN
INTRAMUSCULAR | Status: AC
  Filled 2017-11-19: qty 10

## 2017-11-19 MED ORDER — LIDOCAINE VISCOUS 2 % MT SOLN
OROMUCOSAL | Status: DC | PRN
  Administered 2017-11-19: 1 via OROMUCOSAL

## 2017-11-19 MED ORDER — MEPERIDINE HCL 50 MG/ML IJ SOLN
INTRAMUSCULAR | Status: AC
  Filled 2017-11-19: qty 1

## 2017-11-19 MED ORDER — LIDOCAINE VISCOUS 2 % MT SOLN
OROMUCOSAL | Status: AC
  Filled 2017-11-19: qty 15

## 2017-11-19 NOTE — Op Note (Signed)
[facility] Patient Name: Phillip Kirk Procedure Date: 11/19/2017 7:21 AM MRN: [id number] Date of Birth: 1950-10-20 Attending MD: Hildred Laser , MD CSN: [id number] Age: 67 Admit Type: Outpatient Procedure:                Upper GI endoscopy Indications:              Stricture of the esophagus, For therapy of                            esophageal stricture Providers:                Hildred Laser, MD, Janeece Riggers, RN, Nelma Rothman,                            Technician Referring MD:             Grace Bushy. Luciana Axe, [place] Medicines:                Lidocaine spray, Meperidine 50 mg IV, Midazolam 10                            mg IV Complications:            No immediate complications. Estimated Blood Loss:     Estimated blood loss was minimal. Procedure:                Pre-Anesthesia Assessment:                           - Prior to the procedure, a History and Physical                            was performed, and patient medications and                            allergies were reviewed. The patient's tolerance of                            previous anesthesia was also reviewed. The risks                            and benefits of the procedure and the sedation                            options and risks were discussed with the patient.                            All questions were answered, and informed consent                            was obtained. Prior Anticoagulants: The patient has                            taken no previous anticoagulant or antiplatelet  agents. ASA Grade Assessment: I - A normal, healthy                            patient. After reviewing the risks and benefits,                            the patient was deemed in satisfactory condition to                            undergo the procedure.                           After obtaining informed consent, the endoscope was                            passed under direct vision. Throughout the                          procedure, the patient's blood pressure, pulse, and                            oxygen saturations were monitored continuously. The                            EG-[id number] ([id number]) scope was introduced through the                            mouth, and advanced to the second part of duodenum.                            The upper GI endoscopy was accomplished without                            difficulty. The patient tolerated the procedure                            well. Scope In: 7:44:27 AM Scope Out: 7:56:32 AM Total Procedure Duration: 0 hours 12 minutes 5 seconds  Findings:      Diffuse moderate benign-appearing, intrinsic stenosis were found with       critical narrowing at 25 cm and distally. And they were       traversed.Mucosal rings suggestive of EoE. A TTS dilator was passed       through the scope. Dilation with a 12-13.5-15 mm balloon dilator was       performed to 12 mm, 13.5 mm and 15 mm. The dilation site was examined       and showed mild improvement in luminal narrowing and no perforation.       Biopsies were taken with a cold forceps for histology.      The Z-line was irregular and was found 38 cm from the incisors.      Patchy candidiasis was found in the upper third of the esophagus.      A 3 cm hiatal hernia was present.      Patchy mildly erythematous mucosa without bleeding was found in the  gastric antrum.      The exam of the stomach was otherwise normal.      The duodenal bulb and second portion of the duodenum were normal. Impression:               - Benign-appearing esophageal diffuse stricture..                            Dilated. Biopsied.                           - Z-line irregular, 38 cm from the incisors.                           - Monilial esophagitis. Mild involving proximal                            esophagus.                           - 3 cm hiatal hernia.                           - Erythematous mucosa in the antrum.                            - Normal duodenal bulb and second portion of the                            duodenum. Moderate Sedation:      Moderate (conscious) sedation was administered by the endoscopy nurse       and supervised by the endoscopist. The following parameters were       monitored: oxygen saturation, heart rate, blood pressure, CO2       capnography and response to care. Total physician intraservice time was       24 minutes. Recommendation:           - Patient has a contact number available for                            emergencies. The signs and symptoms of potential                            delayed complications were discussed with the                            patient. Return to normal activities tomorrow.                            Written discharge instructions were provided to the                            patient.                           - Resume previous diet today.                           -  Continue present medications.                           - Mycostatin suspension 500,000 units S&S for 10                            days.                           - No aspirin, ibuprofen, naproxen, or other                            non-steroidal anti-inflammatory drugs for 3 days.                           - Await pathology results.                           - Repeat upper endoscopy PRN. Procedure Code(s):        --- Professional ---                           [phone], Esophagogastroduodenoscopy, flexible,                            transoral; with transendoscopic balloon dilation of                            esophagus (less than 30 mm diameter)                           43239, Esophagogastroduodenoscopy, flexible,                            transoral; with biopsy, single or multiple                           [id number], Moderate sedation services provided by the                            same physician or other qualified health care                            professional  performing the diagnostic or                            therapeutic service that the sedation supports,                            requiring the presence of an independent trained                            observer to assist in the monitoring of the                            patient's level of consciousness and physiological  status; initial 15 minutes of intraservice time,                            patient age 38 years or older                           [phone], Moderate sedation services; each additional                            15 minutes intraservice time Diagnosis Code(s):        --- Professional ---                           K22.2, Esophageal obstruction                           K22.8, Other specified diseases of esophagus                           B37.81, Candidal esophagitis                           K44.9, Diaphragmatic hernia without obstruction or                            gangrene                           K31.89, Other diseases of stomach and duodenum CPT copyright 2016 American Medical Association. All rights reserved. The codes documented in this report are preliminary and upon coder review may  be revised to meet current compliance requirements. Hildred Laser, MD Hildred Laser, MD 11/19/2017 8:10:56 AM This report has been signed electronically. Number of Addenda: 0

## 2017-11-19 NOTE — Discharge Instructions (Signed)
No aspirin or NSAIDs for 3 days. Resume usual medications as before. Mycostatin suspension 500,000 units swish and swallow 4 times a day until prescription runs out. Resume usual diet.  Remember to eat slowly and chew food thoroughly before swallowing. No driving for 24 hours. Physician  will call with biopsy results.   Esophagogastroduodenoscopy, Care After Refer to this sheet in the next few weeks. These instructions provide you with information about caring for yourself after your procedure. Your health care provider may also give you more specific instructions. Your treatment has been planned according to current medical practices, but problems sometimes occur. Call your health care provider if you have any problems or questions after your procedure. Dr Laural Golden:  [phone]; after hours and weekends call the hospital number and have them page the GI doctor on call; they will call you back. What can I expect after the procedure? After the procedure, it is common to have:  A sore throat.  Nausea.  Bloating.  Dizziness.  Fatigue.  Follow these instructions at home:  Do not eat or drink anything until the numbing medicine (local anesthetic) has worn off and your gag reflex has returned. You will know that the local anesthetic has worn off when you can swallow comfortably.  Do not drive for 24 hours if you received a medicine to help you relax (sedative).  If your health care provider took a tissue sample for testing during the procedure, make sure to get your test results. This is your responsibility. Ask your health care provider or the department performing the test when your results will be ready.  Keep all follow-up visits as told by your health care provider. This is important. Contact a health care provider if:  You cannot stop coughing.  You are not urinating.  You are urinating less than usual. Get help right away if:  You have trouble swallowing.  You cannot eat  or drink.  You have throat or chest pain that gets worse.  You have nausea or vomiting.  You have chills.  You have a fever.  You have severe abdominal pain.  You have black, tarry, or bloody stools. This information is not intended to replace advice given to you by your health care provider. Make sure you discuss any questions you have with your health care provider. Document Released: 09/08/2012 Document Revised: 02/28/2016 Document Reviewed: 08/16/2015 Elsevier Interactive Patient Education  [employer].

## 2017-11-19 NOTE — H&P (Signed)
Phillip Kirk is an 67 y.o. male.   Chief Complaint: Patient is here for EGD and ED. HPI: Patient 67 year old Caucasian male who was history of esophageal stricture.  Was last dilated in October 2018.  It was high-grade stricture dilated only to 13.5 mm.  He was also found to have mild Candida esophagitis and has been treated.  Similarly he has been treated for H. pylori gastritis.  He states heartburn is well controlled with PPI.  He has had 4 or 5 episodes of food impaction since his last dilation.  One episode occurred 2 days ago.  All in all he is still able to swallow better than he was prior to first dilation.  He is not losing weight.  He has good appetite.  He denies abdominal pain or melena.  Past Medical History:  Diagnosis Date  . Esophageal stricture. 07/24/2017  . Essential hypertension, benign 07/24/2017  . GERD (gastroesophageal reflux disease)     Past Surgical History:  Procedure Laterality Date  . BIOPSY  07/24/2017   Procedure: BIOPSY;  Surgeon: Rogene Houston, MD;  [facility];  Service: Endoscopy;;  gastric esophagus  . CATARACT EXTRACTION    . CATARACT EXTRACTION Left   . ESOPHAGEAL DILATION N/A 07/24/2017   Procedure: ESOPHAGEAL DILATION;  Surgeon: Rogene Houston, MD;  [facility];  Service: Endoscopy;  Laterality: N/A;  . ESOPHAGOGASTRODUODENOSCOPY N/A 07/24/2017   Procedure: ESOPHAGOGASTRODUODENOSCOPY (EGD);  Surgeon: Rogene Houston, MD;  [facility];  Service: Endoscopy;  Laterality: N/A;  . rt eye surgery     trauma    Family History  Problem Relation Age of Onset  . Alzheimer's disease Sister   . Breast cancer Sister   . Breast cancer Sister    Social History:  reports that he quit smoking about 25 years ago. His smoking use included cigarettes. He has a 28.00 pack-year smoking history. His smokeless tobacco use includes chew. He reports that he drinks alcohol. He reports that he does not use drugs.  Allergies: No  Known Allergies  Medications Prior to Admission  Medication Sig Dispense Refill  . acetaminophen (TYLENOL) 500 MG tablet Take 1,000 mg by mouth every 6 (six) hours as needed for moderate pain or headache.    [name] amLODipine (NORVASC) 5 MG tablet Take 5 mg by mouth daily.    . finasteride (PROSCAR) 5 MG tablet Take 5 mg by mouth daily.    [name] lisinopril (PRINIVIL,ZESTRIL) 40 MG tablet Take 40 mg by mouth daily.     . pantoprazole (PROTONIX) 40 MG tablet Take 1 tablet (40 mg total) by mouth daily before breakfast. 30 tablet 5    No results found for this or any previous visit (from the past 48 hour(s)). No results found.  ROS  Blood pressure (!) 144/87, pulse 74, temperature 98.6 F (37 C), temperature source Oral, resp. rate 13, SpO2 99 %. Physical Exam  Constitutional: He appears well-developed and well-nourished.  HENT:  Mouth/Throat: Oropharynx is clear and moist.  Eyes: Conjunctivae are normal. No scleral icterus.  Neck: No thyromegaly present.  Cardiovascular: Normal rate, regular rhythm and normal heart sounds.  No murmur heard. Respiratory: Effort normal and breath sounds normal.  GI: Soft. He exhibits no distension and no mass. There is no tenderness.  Musculoskeletal: He exhibits no edema.  Lymphadenopathy:    He has no cervical adenopathy.  Neurological: He is alert.  Skin: Skin is warm and dry.     Assessment/Plan  Esophageal dysphagia. Known esophageal stricture. EGD with ED.  Hildred Laser, MD 11/19/2017, 7:28 AM

## 2017-11-20 ENCOUNTER — Encounter (HOSPITAL_COMMUNITY): Payer: Self-pay | Admitting: Internal Medicine

## 2017-11-25 ENCOUNTER — Other Ambulatory Visit (INDEPENDENT_AMBULATORY_CARE_PROVIDER_SITE_OTHER): Payer: Self-pay | Admitting: Internal Medicine

## 2017-11-25 ENCOUNTER — Telehealth (INDEPENDENT_AMBULATORY_CARE_PROVIDER_SITE_OTHER): Payer: Self-pay | Admitting: *Deleted

## 2017-11-25 ENCOUNTER — Institutional Professional Consult (permissible substitution): Payer: Self-pay | Admitting: Neurology

## 2017-11-25 MED ORDER — FLUTICASONE PROPIONATE HFA 220 MCG/ACT IN AERO: INHALATION_SPRAY | RESPIRATORY_TRACT | 0 refills | Status: DC

## 2017-11-25 MED ORDER — NYSTATIN 100000 UNIT/ML MT SUSP: 5.0000 mL | Freq: Four times a day (QID) | OROMUCOSAL | 2 refills | Status: DC

## 2017-11-25 NOTE — Telephone Encounter (Signed)
Dr.Rehman has talked with he patient 's wife . Medications have been called to the [facility].

## 2017-11-25 NOTE — Telephone Encounter (Signed)
Dr.Rehman e-Scribed the following but it did not transmit over.  The following was called to [facility].  Fluticasone 220 mcg - Patient is to inhale 4 puffs twice daily. Nothing to eat of drink 3 hours after inhalation. This is 6 weeks of treatment . Dispense # 4 Canisters.

## 2017-11-25 NOTE — Telephone Encounter (Signed)
Angie is returning a call for patient from yesterday. Please advise

## 2018-01-16 ENCOUNTER — Other Ambulatory Visit (INDEPENDENT_AMBULATORY_CARE_PROVIDER_SITE_OTHER): Payer: Self-pay | Admitting: Internal Medicine

## 2018-01-18 ENCOUNTER — Other Ambulatory Visit (INDEPENDENT_AMBULATORY_CARE_PROVIDER_SITE_OTHER): Payer: Self-pay | Admitting: Internal Medicine

## 2018-01-28 ENCOUNTER — Ambulatory Visit: Payer: Managed Care, Other (non HMO) | Admitting: Cardiovascular Disease

## 2018-01-28 ENCOUNTER — Encounter: Payer: Self-pay | Admitting: Cardiovascular Disease

## 2018-01-28 ENCOUNTER — Other Ambulatory Visit: Payer: Self-pay

## 2018-01-28 VITALS — BP 139/74 | HR 79 | Ht 69.0 in | Wt 181.0 lb

## 2018-01-28 DIAGNOSIS — R9439 Abnormal result of other cardiovascular function study: Secondary | ICD-10-CM | POA: Diagnosis not present

## 2018-01-28 DIAGNOSIS — I10 Essential (primary) hypertension: Secondary | ICD-10-CM

## 2018-01-28 DIAGNOSIS — R9431 Abnormal electrocardiogram [ECG] [EKG]: Secondary | ICD-10-CM

## 2018-01-28 DIAGNOSIS — G4730 Sleep apnea, unspecified: Secondary | ICD-10-CM

## 2018-01-28 DIAGNOSIS — I25118 Atherosclerotic heart disease of native coronary artery with other forms of angina pectoris: Secondary | ICD-10-CM

## 2018-01-28 MED ORDER — ATORVASTATIN CALCIUM 40 MG PO TABS: 40.0000 mg | ORAL_TABLET | Freq: Every day | ORAL | 1 refills | Status: DC

## 2018-01-28 MED ORDER — METOPROLOL TARTRATE 25 MG PO TABS: 12.5000 mg | ORAL_TABLET | Freq: Two times a day (BID) | ORAL | 1 refills | Status: DC

## 2018-01-28 NOTE — Progress Notes (Signed)
SUBJECTIVE: The patient returns for follow-up after undergoing cardiovascular testing performed for the evaluation of an abnormal ECG.  Nuclear stress test on 11/06/2017 was a low risk study.  There is a hypertensive response to exercise.  [facility] treadmill score was low (7).  There was a moderate size reversible inferior wall defect consistent with ischemia possibly in the RCA distribution.  LVEF 69%.  He underwent left rotator cuff repair on 11/25/2017.  There were no complications.  The patient denies any symptoms of chest pain, palpitations, shortness of breath, lightheadedness, dizziness, leg swelling, orthopnea, PND, and syncope.  His father had a CVA at age 74.  The patient had been taking baby aspirin every day but then stopped taking when news reports mentioned that ASA may have harmful effects.     Review of Systems: As per "subjective", otherwise negative.  No Known Allergies  Current Outpatient Medications  Medication Sig Dispense Refill  . acetaminophen (TYLENOL) 500 MG tablet Take 1,000 mg by mouth every 6 (six) hours as needed for moderate pain or headache.    [name] amLODipine (NORVASC) 5 MG tablet Take 5 mg by mouth daily.    . finasteride (PROSCAR) 5 MG tablet Take 5 mg by mouth daily.    . fluticasone (FLOVENT HFA) 220 MCG/ACT inhaler Use medications as directed. It is for esophagus and not the lungs. 4 Inhaler 0  . lisinopril (PRINIVIL,ZESTRIL) 40 MG tablet Take 40 mg by mouth daily.     [name] nystatin (MYCOSTATIN) 100000 UNIT/ML suspension Take 5 mLs (500,000 Units total) by mouth 4 (four) times daily. 240 mL 2  . pantoprazole (PROTONIX) 40 MG tablet TAKE 1 TABLET BY MOUTH DAILY BEFORE BREAKFAST 30 tablet 5   No current facility-administered medications for this visit.     Past Medical History:  Diagnosis Date  . Dysphagia 07/24/2017  . Essential hypertension, benign 07/24/2017  . GERD (gastroesophageal reflux disease)     Past Surgical History:  Procedure  Laterality Date  . BIOPSY  07/24/2017   Procedure: BIOPSY;  Surgeon: Rogene Houston, MD;  [facility];  Service: Endoscopy;;  gastric esophagus  . CATARACT EXTRACTION    . CATARACT EXTRACTION Left   . ESOPHAGEAL DILATION N/A 07/24/2017   Procedure: ESOPHAGEAL DILATION;  Surgeon: Rogene Houston, MD;  [facility];  Service: Endoscopy;  Laterality: N/A;  . ESOPHAGEAL DILATION N/A 11/19/2017   Procedure: ESOPHAGEAL DILATION;  Surgeon: Rogene Houston, MD;  [facility];  Service: Endoscopy;  Laterality: N/A;  . ESOPHAGOGASTRODUODENOSCOPY N/A 07/24/2017   Procedure: ESOPHAGOGASTRODUODENOSCOPY (EGD);  Surgeon: Rogene Houston, MD;  [facility];  Service: Endoscopy;  Laterality: N/A;  . ESOPHAGOGASTRODUODENOSCOPY N/A 11/19/2017   Procedure: ESOPHAGOGASTRODUODENOSCOPY (EGD);  Surgeon: Rogene Houston, MD;  [facility];  Service: Endoscopy;  Laterality: N/A;  . rt eye surgery     trauma    Social History   Socioeconomic History  . Marital status: Married    Spouse name: Not on file  . Number of children: Not on file  . Years of education: Not on file  . Highest education level: Not on file  Occupational History  . Not on file  Social Needs  . Financial resource strain: Not on file  . Food insecurity:    Worry: Not on file    Inability: Not on file  . Transportation needs:    Medical: Not on file    Non-medical: Not  on file  Tobacco Use  . Smoking status: Former Smoker    Packs/day: 1.00    Years: 28.00    Pack years: 28.00    Types: Cigarettes    Last attempt to quit: 10/05/1992    Years since quitting: 25.3  . Smokeless tobacco: Former Systems developer    Types: Chew  Substance and Sexual Activity  . Alcohol use: Yes    Comment: occasonally  . Drug use: No  . Sexual activity: Not on file  Lifestyle  . Physical activity:    Days per week: Not on file    Minutes per session: Not on file  . Stress: Not on file    Relationships  . Social connections:    Talks on phone: Not on file    Gets together: Not on file    Attends religious service: Not on file    Active member of club or organization: Not on file    Attends meetings of clubs or organizations: Not on file    Relationship status: Not on file  . Intimate partner violence:    Fear of current or ex partner: Not on file    Emotionally abused: Not on file    Physically abused: Not on file    Forced sexual activity: Not on file  Other Topics Concern  . Not on file  Social History Narrative  . Not on file     Vitals:   01/28/18 0857  BP: 139/74  Pulse: 79  SpO2: 97%  Weight: 181 lb (82.1 kg)  Height: 5\' 9"  (1.753 m)    Wt Readings from Last 3 Encounters:  01/28/18 181 lb (82.1 kg)  10/29/17 178 lb (80.7 kg)  07/24/17 170 lb (77.1 kg)     PHYSICAL EXAM General: NAD HEENT: Normal. Neck: No JVD, no thyromegaly. Lungs: Clear to auscultation bilaterally with normal respiratory effort. CV: Regular rate and rhythm, normal S1/S2, no G8/Q7, soft 1/6 systolic murmur over right upper sternal border. No pretibial or periankle edema.     Abdomen: Soft, nontender, no distention.  Neurologic: Alert and oriented.  Psych: Normal affect. Skin: Normal. Musculoskeletal: No gross deformities.    ECG: Most recent ECG reviewed.   Labs: No results found for: K, BUN, CREATININE, ALT, TSH, HGB   Lipids: No results found for: LDLCALC, LDLDIRECT, CHOL, TRIG, HDL     ASSESSMENT AND PLAN:  1.  Abnormal ECG with abnormal stress test demonstrating inferior wall ischemia: Symptomatically stable.  He may have underlying coronary disease.  I will start aspirin 81 mg and Lipitor 40 mg along with metoprolol 12.5 mg twice daily.  2.  Hypertension: Controlled on present therapy which includes amlodipine 5 mg and lisinopril 40 mg.    I am also starting low-dose metoprolol for the abnormal stress test suggestive of coronary disease.  3.  Sleep  disordered breathing: He likely has sleep apnea.  I have previously arranged for a sleep study.  I previously informed him that sleep apnea is an independent risk factor for cardiac arrhythmias, myocardial infarction, stroke, and is a causative factor for uncontrolled hypertension.      Disposition: Follow up 3-4 months  Kate Sable, M.D., F.A.C.C.

## 2018-01-28 NOTE — Patient Instructions (Signed)
Your physician recommends that you schedule a follow-up appointment in: 3-4 MONTHS WITH DR [facility]   Your physician has recommended you make the following change in your medication:   START LIPITOR 40 MG DAILY  START METOPROLOL 12.5 MG (1/2 TABLET ) TWICE DAILY  START ASPIRIN 81 MG DAILY  Thank you for choosing [facility]!!

## 2018-05-12 ENCOUNTER — Ambulatory Visit: Payer: Managed Care, Other (non HMO) | Admitting: Cardiovascular Disease

## 2018-05-19 ENCOUNTER — Other Ambulatory Visit: Payer: Self-pay | Admitting: Cardiovascular Disease

## 2018-06-14 ENCOUNTER — Other Ambulatory Visit: Payer: Self-pay | Admitting: Cardiovascular Disease

## 2018-06-17 ENCOUNTER — Other Ambulatory Visit (INDEPENDENT_AMBULATORY_CARE_PROVIDER_SITE_OTHER): Payer: Self-pay | Admitting: Internal Medicine

## 2018-06-27 ENCOUNTER — Other Ambulatory Visit: Payer: Self-pay | Admitting: Cardiovascular Disease

## 2018-07-12 ENCOUNTER — Other Ambulatory Visit (INDEPENDENT_AMBULATORY_CARE_PROVIDER_SITE_OTHER): Payer: Self-pay | Admitting: Internal Medicine

## 2018-07-26 ENCOUNTER — Other Ambulatory Visit: Payer: Self-pay | Admitting: Cardiovascular Disease

## 2018-08-06 ENCOUNTER — Other Ambulatory Visit: Payer: Self-pay | Admitting: Cardiovascular Disease

## 2018-08-14 ENCOUNTER — Other Ambulatory Visit: Payer: Self-pay | Admitting: Cardiovascular Disease

## 2018-08-17 ENCOUNTER — Other Ambulatory Visit: Payer: Self-pay | Admitting: Cardiovascular Disease

## 2018-08-31 ENCOUNTER — Ambulatory Visit (INDEPENDENT_AMBULATORY_CARE_PROVIDER_SITE_OTHER): Payer: Managed Care, Other (non HMO) | Admitting: Internal Medicine

## 2018-09-07 ENCOUNTER — Encounter (INDEPENDENT_AMBULATORY_CARE_PROVIDER_SITE_OTHER): Payer: Self-pay | Admitting: Internal Medicine

## 2018-09-07 ENCOUNTER — Ambulatory Visit (INDEPENDENT_AMBULATORY_CARE_PROVIDER_SITE_OTHER): Payer: Managed Care, Other (non HMO) | Admitting: Internal Medicine

## 2018-09-07 VITALS — BP 156/80 | HR 80 | Temp 98.0°F | Ht 69.0 in | Wt 177.8 lb

## 2018-09-07 DIAGNOSIS — R1319 Other dysphagia: Secondary | ICD-10-CM

## 2018-09-07 DIAGNOSIS — R1310 Dysphagia, unspecified: Secondary | ICD-10-CM | POA: Diagnosis not present

## 2018-09-07 NOTE — Progress Notes (Signed)
Subjective:    Patient ID: Phillip Kirk, male    DOB: April 25, 1951, 67 y.o.   MRN: [id number]  HPI Presents today with c/o dysphagia. He states every once in a while he is having food lodge. He says if he eats meats, it will lodge most of the time. Symptoms for several months.  Appetite is okay. Weight loss of about 7 pounds over the past 3 months per patient which was intentional.  BMs move okay.   Underwent and EGD/ED in February of this year for therapy of esophageal stricture. Impression:  Benign-appearing esophageal diffuse stricture..                            Dilated. Biopsied.                           - Z-line irregular, 38 cm from the incisors.                           - Monilial esophagitis. Mild involving proximal                            esophagus.                           - 3 cm hiatal hernia.                           - Erythematous mucosa in the antrum.                           - Normal duodenal bulb and second portion of   duodenum. On EGD in February of this year, threat with Fluticasone  for eosinophilic esophagitis x 6 weeks   Review of Systems Past Medical History:  Diagnosis Date  . Dysphagia 07/24/2017  . Essential hypertension, benign 07/24/2017  . GERD (gastroesophageal reflux disease)     Past Surgical History:  Procedure Laterality Date  . BIOPSY  07/24/2017   Procedure: BIOPSY;  Surgeon: Rogene Houston, MD;  [facility];  Service: Endoscopy;;  gastric esophagus  . CATARACT EXTRACTION    . CATARACT EXTRACTION Left   . ESOPHAGEAL DILATION N/A 07/24/2017   Procedure: ESOPHAGEAL DILATION;  Surgeon: Rogene Houston, MD;  [facility];  Service: Endoscopy;  Laterality: N/A;  . ESOPHAGEAL DILATION N/A 11/19/2017   Procedure: ESOPHAGEAL DILATION;  Surgeon: Rogene Houston, MD;  [facility];  Service: Endoscopy;  Laterality: N/A;  . ESOPHAGOGASTRODUODENOSCOPY N/A 07/24/2017   Procedure: ESOPHAGOGASTRODUODENOSCOPY  (EGD);  Surgeon: Rogene Houston, MD;  [facility];  Service: Endoscopy;  Laterality: N/A;  . ESOPHAGOGASTRODUODENOSCOPY N/A 11/19/2017   Procedure: ESOPHAGOGASTRODUODENOSCOPY (EGD);  Surgeon: Rogene Houston, MD;  [facility];  Service: Endoscopy;  Laterality: N/A;  . rt eye surgery     trauma    No Known Allergies  Current Outpatient Medications on File Prior to Visit  Medication Sig Dispense Refill  . acetaminophen (TYLENOL) 500 MG tablet Take 1,000 mg by mouth every 6 (six) hours as needed for moderate pain or headache.    [name] amLODipine (NORVASC) 5 MG tablet Take 5 mg by mouth daily.    [name]  atorvastatin (LIPITOR) 40 MG tablet TAKE 1 TABLET BY MOUTH EVERY DAY 30 tablet 0  . finasteride (PROSCAR) 5 MG tablet Take 5 mg by mouth daily.    [name] lisinopril (PRINIVIL,ZESTRIL) 40 MG tablet Take 40 mg by mouth daily.     . pantoprazole (PROTONIX) 40 MG tablet TAKE 1 TABLET BY MOUTH DAILY BEFORE BREAKFAST 30 tablet 5   No current facility-administered medications on file prior to visit.         Objective:   Physical Exam Blood pressure (!) 156/80, pulse 80, temperature 98 F (36.7 C), height 5\' 9"  (1.753 m), weight 177 lb 12.8 oz (80.6 kg). Alert and oriented. Skin warm and dry. Oral mucosa is moist.   . Sclera anicteric, conjunctivae is pink. Thyroid not enlarged. No cervical lymphadenopathy. Lungs clear. Heart regular rate and rhythm.  Abdomen is soft. Bowel sounds are positive. No hepatomegaly. No abdominal masses felt. No tenderness.  No edema to lower extremities.          Assessment & Plan:  Dysphagia. Discussed with Dr. Laural Golden. Fluticasone 4 puffs twice a day for 6 weeks of therapy. He will call in 1 week to let me know how he is doing. Will schedule a Barium Swallow.  Thomas Hoff will call in Rx

## 2018-09-07 NOTE — Patient Instructions (Signed)
Rx for flonase x 8 weeks sent to his pharmacy

## 2018-09-08 ENCOUNTER — Telehealth (INDEPENDENT_AMBULATORY_CARE_PROVIDER_SITE_OTHER): Payer: Self-pay | Admitting: Internal Medicine

## 2018-09-09 NOTE — Telephone Encounter (Signed)
err

## 2018-09-17 ENCOUNTER — Other Ambulatory Visit: Payer: Self-pay | Admitting: Cardiovascular Disease

## 2018-09-19 ENCOUNTER — Other Ambulatory Visit: Payer: Self-pay | Admitting: Cardiovascular Disease

## 2018-09-27 ENCOUNTER — Other Ambulatory Visit: Payer: Self-pay

## 2018-09-27 ENCOUNTER — Other Ambulatory Visit: Payer: Self-pay | Admitting: Neurosurgery

## 2018-09-27 DIAGNOSIS — M5126 Other intervertebral disc displacement, lumbar region: Secondary | ICD-10-CM

## 2018-09-30 ENCOUNTER — Ambulatory Visit
Admission: RE | Admit: 2018-09-30 | Discharge: 2018-09-30 | Disposition: A | Discharge status: Home / Self Care | Payer: Managed Care, Other (non HMO) | Source: Ambulatory Visit | Attending: Neurosurgery | Admitting: Neurosurgery

## 2018-09-30 DIAGNOSIS — M5126 Other intervertebral disc displacement, lumbar region: Secondary | ICD-10-CM

## 2018-09-30 MED ORDER — IOPAMIDOL (ISOVUE-M 200) INJECTION 41%
1.0000 mL | Freq: Once | INTRAMUSCULAR | Status: AC
  Administered 2018-09-30: 1 mL via EPIDURAL

## 2018-09-30 MED ORDER — METHYLPREDNISOLONE ACETATE 40 MG/ML INJ SUSP (RADIOLOG
120.0000 mg | Freq: Once | INTRAMUSCULAR | Status: AC
  Administered 2018-09-30: 120 mg via EPIDURAL

## 2018-09-30 NOTE — Discharge Instructions (Signed)

## 2018-10-11 ENCOUNTER — Other Ambulatory Visit (INDEPENDENT_AMBULATORY_CARE_PROVIDER_SITE_OTHER): Payer: Self-pay | Admitting: Internal Medicine

## 2018-10-11 ENCOUNTER — Other Ambulatory Visit: Payer: Managed Care, Other (non HMO)

## 2018-11-06 ENCOUNTER — Other Ambulatory Visit (INDEPENDENT_AMBULATORY_CARE_PROVIDER_SITE_OTHER): Payer: Self-pay | Admitting: Internal Medicine

## 2019-02-14 ENCOUNTER — Other Ambulatory Visit: Payer: Self-pay | Admitting: Cardiovascular Disease

## 2019-02-14 MED ORDER — ATORVASTATIN CALCIUM 40 MG PO TABS: 40.0000 mg | ORAL_TABLET | Freq: Every day | ORAL | 0 refills | Status: DC

## 2019-02-14 NOTE — Telephone Encounter (Signed)
Medication sent to pharmacy  

## 2019-02-14 NOTE — Telephone Encounter (Signed)
°*  STAT* If patient is at the pharmacy, call can be transferred to refill team.   1. Which medications need to be refilled? atorvastatin (LIPITOR) 40 MG tablet     2. Which pharmacy/location (including street and city if local pharmacy) is medication to be sent to? [facility]  3. Do they need a 30 day or 90 day supply?

## 2019-03-13 ENCOUNTER — Other Ambulatory Visit: Payer: Self-pay | Admitting: Cardiovascular Disease

## 2019-03-24 ENCOUNTER — Other Ambulatory Visit: Payer: Self-pay | Admitting: Cardiovascular Disease

## 2019-04-30 ENCOUNTER — Other Ambulatory Visit: Payer: Self-pay | Admitting: Cardiovascular Disease

## 2019-08-16 ENCOUNTER — Emergency Department (HOSPITAL_COMMUNITY): Payer: Managed Care, Other (non HMO)

## 2019-08-16 ENCOUNTER — Other Ambulatory Visit: Payer: Self-pay

## 2019-08-16 ENCOUNTER — Inpatient Hospital Stay (HOSPITAL_COMMUNITY)
Admission: EM | Admit: 2019-08-16 | Discharge: 2019-08-17 | DRG: 683 | Disposition: A | Discharge status: Home / Self Care | Payer: Managed Care, Other (non HMO) | Attending: Family Medicine | Admitting: Family Medicine

## 2019-08-16 ENCOUNTER — Encounter (HOSPITAL_COMMUNITY): Payer: Self-pay

## 2019-08-16 DIAGNOSIS — K222 Esophageal obstruction: Secondary | ICD-10-CM

## 2019-08-16 DIAGNOSIS — Z820 Family history of epilepsy and other diseases of the nervous system: Secondary | ICD-10-CM | POA: Diagnosis not present

## 2019-08-16 DIAGNOSIS — Z803 Family history of malignant neoplasm of breast: Secondary | ICD-10-CM

## 2019-08-16 DIAGNOSIS — I2510 Atherosclerotic heart disease of native coronary artery without angina pectoris: Secondary | ICD-10-CM | POA: Diagnosis present

## 2019-08-16 DIAGNOSIS — K219 Gastro-esophageal reflux disease without esophagitis: Secondary | ICD-10-CM

## 2019-08-16 DIAGNOSIS — D72828 Other elevated white blood cell count: Secondary | ICD-10-CM | POA: Diagnosis present

## 2019-08-16 DIAGNOSIS — E871 Hypo-osmolality and hyponatremia: Secondary | ICD-10-CM | POA: Diagnosis present

## 2019-08-16 DIAGNOSIS — N320 Bladder-neck obstruction: Secondary | ICD-10-CM | POA: Diagnosis not present

## 2019-08-16 DIAGNOSIS — N138 Other obstructive and reflux uropathy: Secondary | ICD-10-CM | POA: Diagnosis present

## 2019-08-16 DIAGNOSIS — R338 Other retention of urine: Secondary | ICD-10-CM | POA: Diagnosis present

## 2019-08-16 DIAGNOSIS — Z20828 Contact with and (suspected) exposure to other viral communicable diseases: Secondary | ICD-10-CM | POA: Diagnosis present

## 2019-08-16 DIAGNOSIS — Z87891 Personal history of nicotine dependence: Secondary | ICD-10-CM | POA: Diagnosis not present

## 2019-08-16 DIAGNOSIS — I10 Essential (primary) hypertension: Secondary | ICD-10-CM | POA: Diagnosis present

## 2019-08-16 DIAGNOSIS — N179 Acute kidney failure, unspecified: Principal | ICD-10-CM | POA: Diagnosis present

## 2019-08-16 DIAGNOSIS — R339 Retention of urine, unspecified: Secondary | ICD-10-CM

## 2019-08-16 DIAGNOSIS — I252 Old myocardial infarction: Secondary | ICD-10-CM

## 2019-08-16 DIAGNOSIS — N401 Enlarged prostate with lower urinary tract symptoms: Secondary | ICD-10-CM

## 2019-08-16 DIAGNOSIS — R319 Hematuria, unspecified: Secondary | ICD-10-CM | POA: Diagnosis present

## 2019-08-16 DIAGNOSIS — N400 Enlarged prostate without lower urinary tract symptoms: Secondary | ICD-10-CM

## 2019-08-16 LAB — URINALYSIS, ROUTINE W REFLEX MICROSCOPIC
Bacteria, UA: NONE SEEN
Bilirubin Urine: NEGATIVE
Glucose, UA: NEGATIVE mg/dL
Ketones, ur: NEGATIVE mg/dL
Leukocytes,Ua: NEGATIVE
Nitrite: NEGATIVE
Protein, ur: NEGATIVE mg/dL
RBC / HPF: >50 RBC/hpf — ABNORMAL HIGH (ref 0–5)
Specific Gravity, Urine: 1.011 (ref 1.005–1.030)
pH: 5 (ref 5.0–8.0)

## 2019-08-16 LAB — BASIC METABOLIC PANEL
Anion gap: 17 — ABNORMAL HIGH (ref 5–15)
BUN: 61 mg/dL — ABNORMAL HIGH (ref 8–23)
CO2: 21 mmol/L — ABNORMAL LOW (ref 22–32)
Calcium: 8.9 mg/dL (ref 8.9–10.3)
Chloride: 95 mmol/L — ABNORMAL LOW (ref 98–111)
Creatinine, Ser: 7.02 mg/dL — ABNORMAL HIGH (ref 0.61–1.24)
GFR calc Af Amer: 8 mL/min — ABNORMAL LOW (ref >60)
GFR calc non Af Amer: 7 mL/min — ABNORMAL LOW (ref >60)
Glucose, Bld: 129 mg/dL — ABNORMAL HIGH (ref 70–99)
Potassium: 4.1 mmol/L (ref 3.5–5.1)
Sodium: 133 mmol/L — ABNORMAL LOW (ref 135–145)

## 2019-08-16 LAB — CBC WITH DIFFERENTIAL/PLATELET
Abs Immature Granulocytes: 0.08 10*3/uL — ABNORMAL HIGH (ref 0.00–0.07)
Basophils Absolute: 0 10*3/uL (ref 0.0–0.1)
Basophils Relative: 0 %
Eosinophils Absolute: 0 10*3/uL (ref 0.0–0.5)
Eosinophils Relative: 0 %
HCT: 48.4 % (ref 39.0–52.0)
Hemoglobin: 15.7 g/dL (ref 13.0–17.0)
Immature Granulocytes: 1 %
Lymphocytes Relative: 8 %
Lymphs Abs: 1.4 10*3/uL (ref 0.7–4.0)
MCH: 29.1 pg (ref 26.0–34.0)
MCHC: 32.4 g/dL (ref 30.0–36.0)
MCV: 89.8 fL (ref 80.0–100.0)
Monocytes Absolute: 1.6 10*3/uL — ABNORMAL HIGH (ref 0.1–1.0)
Monocytes Relative: 10 %
Neutro Abs: 13.6 10*3/uL — ABNORMAL HIGH (ref 1.7–7.7)
Neutrophils Relative %: 81 %
Platelets: 221 10*3/uL (ref 150–400)
RBC: 5.39 MIL/uL (ref 4.22–5.81)
RDW: 13.9 % (ref 11.5–15.5)
WBC: 16.7 10*3/uL — ABNORMAL HIGH (ref 4.0–10.5)
nRBC: 0 % (ref 0.0–0.2)

## 2019-08-16 LAB — MAGNESIUM: Magnesium: 1.9 mg/dL (ref 1.7–2.4)

## 2019-08-16 MED ORDER — ATORVASTATIN CALCIUM 40 MG PO TABS
40.0000 mg | ORAL_TABLET | Freq: Every day | ORAL | Status: DC
  Administered 2019-08-16: 40 mg via ORAL
  Filled 2019-08-16: qty 1

## 2019-08-16 MED ORDER — SODIUM CHLORIDE 0.9 % IV SOLN
INTRAVENOUS | Status: DC
  Administered 2019-08-16: 21:00:00 via INTRAVENOUS

## 2019-08-16 MED ORDER — METOPROLOL TARTRATE 25 MG PO TABS
12.5000 mg | ORAL_TABLET | Freq: Two times a day (BID) | ORAL | Status: DC
  Administered 2019-08-17: 08:00:00 12.5 mg via ORAL
  Filled 2019-08-16 (×2): qty 1

## 2019-08-16 MED ORDER — PANTOPRAZOLE SODIUM 40 MG PO TBEC
40.0000 mg | DELAYED_RELEASE_TABLET | Freq: Every day | ORAL | Status: DC
  Administered 2019-08-16 – 2019-08-17 (×2): 40 mg via ORAL
  Filled 2019-08-16 (×2): qty 1

## 2019-08-16 MED ORDER — BUDESONIDE 0.5 MG/2ML IN SUSP: 1.0000 mg | Freq: Two times a day (BID) | RESPIRATORY_TRACT | Status: DC | PRN

## 2019-08-16 MED ORDER — SODIUM CHLORIDE 0.9 % IV BOLUS (SEPSIS)
500.0000 mL | Freq: Once | INTRAVENOUS | Status: AC
  Administered 2019-08-16: 500 mL via INTRAVENOUS

## 2019-08-16 MED ORDER — FLUTICASONE PROPIONATE HFA 220 MCG/ACT IN AERO: 2.0000 | INHALATION_SPRAY | Freq: Two times a day (BID) | RESPIRATORY_TRACT | Status: DC | PRN

## 2019-08-16 MED ORDER — SODIUM CHLORIDE 0.9 % IV SOLN: 1000.0000 mL | INTRAVENOUS | Status: DC

## 2019-08-16 MED ORDER — FINASTERIDE 5 MG PO TABS
5.0000 mg | ORAL_TABLET | Freq: Every day | ORAL | Status: DC
  Administered 2019-08-16: 5 mg via ORAL
  Filled 2019-08-16 (×2): qty 1

## 2019-08-16 MED ORDER — HEPARIN SODIUM (PORCINE) 5000 UNIT/ML IJ SOLN
5000.0000 [IU] | Freq: Three times a day (TID) | INTRAMUSCULAR | Status: DC
  Administered 2019-08-16: 5000 [IU] via SUBCUTANEOUS
  Filled 2019-08-16: qty 1

## 2019-08-16 MED ORDER — AMLODIPINE BESYLATE 5 MG PO TABS
5.0000 mg | ORAL_TABLET | Freq: Every day | ORAL | Status: DC
  Administered 2019-08-17: 5 mg via ORAL
  Filled 2019-08-16 (×2): qty 1

## 2019-08-16 MED ORDER — MORPHINE SULFATE (PF) 4 MG/ML IV SOLN
4.0000 mg | Freq: Once | INTRAVENOUS | Status: AC
  Administered 2019-08-16: 4 mg via INTRAVENOUS
  Filled 2019-08-16: qty 1

## 2019-08-16 MED ORDER — ACETAMINOPHEN 500 MG PO TABS: 1000.0000 mg | ORAL_TABLET | Freq: Four times a day (QID) | ORAL | Status: DC | PRN

## 2019-08-16 MED ORDER — ONDANSETRON HCL 4 MG/2ML IJ SOLN
4.0000 mg | Freq: Once | INTRAMUSCULAR | Status: AC
  Administered 2019-08-16: 4 mg via INTRAVENOUS
  Filled 2019-08-16: qty 2

## 2019-08-16 NOTE — Discharge Instructions (Signed)

## 2019-08-16 NOTE — ED Provider Notes (Signed)
[facility] EMERGENCY DEPARTMENT Provider Note   CSN: [id number] Arrival date & time: 08/16/19  1024     History   Chief Complaint No chief complaint on file.   HPI Fredie Ranard is a 68 y.o. male.     Patient is a 68 year old male who presents to the emergency department because of difficulty with urinating.  The patient states that he has had problems since August 13, 2019.  The patient states that he started out could only pass a very small amount of urine and then he could not pass his urine.  He has not had fever or chills.  No nausea or vomiting, only abdominal pain related to not being able to pass his urine.  He was in contact with his primary care physician and they suggested that he come to the emergency department for additional evaluation and management.  Patient has not had any recent operations or procedures involving his bladder, or kidneys.  The history is provided by the patient.    Past Medical History:  Diagnosis Date  . Dysphagia 07/24/2017  . Essential hypertension, benign 07/24/2017  . GERD (gastroesophageal reflux disease)     Patient Active Problem List   Diagnosis Date Noted  . Esophageal stricture 08/10/2017  . Essential hypertension, benign 07/24/2017  . Dysphagia 07/24/2017  . Esophageal dysphagia 07/24/2017    Past Surgical History:  Procedure Laterality Date  . BIOPSY  07/24/2017   Procedure: BIOPSY;  Surgeon: Rogene Houston, MD;  [facility];  Service: Endoscopy;;  gastric esophagus  . CATARACT EXTRACTION    . CATARACT EXTRACTION Left   . ESOPHAGEAL DILATION N/A 07/24/2017   Procedure: ESOPHAGEAL DILATION;  Surgeon: Rogene Houston, MD;  [facility];  Service: Endoscopy;  Laterality: N/A;  . ESOPHAGEAL DILATION N/A 11/19/2017   Procedure: ESOPHAGEAL DILATION;  Surgeon: Rogene Houston, MD;  [facility];  Service: Endoscopy;  Laterality: N/A;  . ESOPHAGOGASTRODUODENOSCOPY N/A 07/24/2017   Procedure: ESOPHAGOGASTRODUODENOSCOPY (EGD);  Surgeon: Rogene Houston, MD;  [facility];  Service: Endoscopy;  Laterality: N/A;  . ESOPHAGOGASTRODUODENOSCOPY N/A 11/19/2017   Procedure: ESOPHAGOGASTRODUODENOSCOPY (EGD);  Surgeon: Rogene Houston, MD;  [facility];  Service: Endoscopy;  Laterality: N/A;  . rt eye surgery     trauma        Home Medications    Prior to Admission medications   Medication Sig Start Date End Date Taking? Authorizing Provider  acetaminophen (TYLENOL) 500 MG tablet Take 1,000 mg by mouth every 6 (six) hours as needed for moderate pain or headache.    [provider]  amLODipine (NORVASC) 5 MG tablet Take 5 mg by mouth daily.    [provider]  atorvastatin (LIPITOR) 40 MG tablet TAKE 1 TABLET BY MOUTH EVERY DAY 03/24/19   Herminio Commons, MD  finasteride (PROSCAR) 5 MG tablet Take 5 mg by mouth daily.    [provider]  FLOVENT HFA 220 MCG/ACT inhaler INAHLE 4 PUFFS BY MOUTH TWICE DAILY (DONT EAT OR DRINK FOR 3 HOURS AFTER 10/12/18   Vaughan Basta, Rona Ravens, NP  lisinopril (PRINIVIL,ZESTRIL) 40 MG tablet Take 40 mg by mouth daily.     [provider]  metoprolol tartrate (LOPRESSOR) 25 MG tablet TAKE 0.5 TABLETS (12.5 MG TOTAL) BY MOUTH 2 (TWO) TIMES DAILY. 05/02/19 07/31/19  Herminio Commons, MD  pantoprazole (PROTONIX) 40 MG tablet TAKE 1 TABLET BY MOUTH DAILY BEFORE BREAKFAST 07/13/18   Rehman,  Mechele Dawley, MD    Family History Family History  Problem Relation Age of Onset  . Alzheimer's disease Sister   . Breast cancer Sister   . Breast cancer Sister     Social History Social History   Tobacco Use  . Smoking status: Former Smoker    Packs/day: 1.00    Years: 28.00    Pack years: 28.00    Types: Cigarettes    Quit date: 10/05/1992    Years since quitting: 26.8  . Smokeless tobacco: Former Systems developer    Types: Chew  Substance Use Topics  . Alcohol use: Yes    Comment: occasonally  . Drug  use: No     Allergies   Patient has no known allergies.   Review of Systems Review of Systems  Constitutional: Negative for activity change, appetite change, chills and fever.  HENT: Negative for congestion, ear discharge, ear pain, facial swelling, nosebleeds, rhinorrhea, sneezing and tinnitus.   Eyes: Negative for photophobia, pain and discharge.  Respiratory: Negative for cough, choking, shortness of breath and wheezing.   Cardiovascular: Negative for chest pain, palpitations and leg swelling.  Gastrointestinal: Negative for abdominal pain, blood in stool, constipation, diarrhea, nausea and vomiting.  Genitourinary: Positive for difficulty urinating. Negative for dysuria, flank pain, frequency and hematuria.  Musculoskeletal: Negative for back pain, gait problem, myalgias and neck pain.  Skin: Negative for color change, rash and wound.  Neurological: Negative for dizziness, seizures, syncope, facial asymmetry, speech difficulty, weakness and numbness.  Hematological: Negative for adenopathy. Does not bruise/bleed easily.  Psychiatric/Behavioral: Negative for agitation, confusion, hallucinations, self-injury and suicidal ideas. The patient is not nervous/anxious.      Physical Exam Updated Vital Signs BP (!) 169/84 (BP Location: Right Arm)   Pulse 91   Temp 98.2 F (36.8 C) (Oral)   Resp 16   Ht 5' 9.5" (1.765 m)   Wt 76.7 kg   SpO2 99%   BMI 24.60 kg/m   Physical Exam Vitals signs and nursing note reviewed.  Constitutional:      Appearance: He is well-developed. He is not toxic-appearing.  HENT:     Head: Normocephalic.     Right Ear: Tympanic membrane and external ear normal.     Left Ear: Tympanic membrane and external ear normal.  Eyes:     General: Lids are normal.     Pupils: Pupils are equal, round, and reactive to light.  Neck:     Musculoskeletal: Normal range of motion and neck supple.     Vascular: No carotid bruit.  Cardiovascular:     Rate and  Rhythm: Normal rate and regular rhythm.     Pulses: Normal pulses.     Heart sounds: Normal heart sounds.  Pulmonary:     Effort: No respiratory distress.     Breath sounds: Normal breath sounds.  Abdominal:     General: Bowel sounds are normal.     Palpations: Abdomen is soft.     Tenderness: There is no abdominal tenderness. There is no guarding.     Comments: Bladder is distended halfway to the umbilicus.  The lower abdomen is sore.  Bowel sounds are present and active.  No CVA tenderness.  Musculoskeletal: Normal range of motion.     Right lower leg: No edema.     Left lower leg: No edema.  Lymphadenopathy:     Head:     Right side of head: No submandibular adenopathy.     Left side of head:  No submandibular adenopathy.     Cervical: No cervical adenopathy.  Skin:    General: Skin is warm and dry.     Capillary Refill: Capillary refill takes less than 2 seconds.     Coloration: Skin is not jaundiced.  Neurological:     Mental Status: He is alert and oriented to person, place, and time.     Cranial Nerves: No cranial nerve deficit.     Sensory: No sensory deficit.  Psychiatric:        Speech: Speech normal.      ED Treatments / Results  Labs (all labs ordered are listed, but only abnormal results are displayed) Labs Reviewed  URINALYSIS, ROUTINE W REFLEX MICROSCOPIC    EKG None  Radiology No results found.  Procedures Procedures (including critical care time)  Medications Ordered in ED Medications - No data to display   Initial Impression / Assessment and Plan / ED Course  I have reviewed the triage vital signs and the nursing notes.  Pertinent labs & imaging results that were available during my care of the patient were reviewed by me and considered in my medical decision making (see chart for details).          Final Clinical Impressions(s) / ED Diagnoses MDM  Patient is not been able to pass his urine adequately since November 7.  He has not  had any recent injury or trauma.  No recent operations involving the urinary system.  The been no fever or chills reported.  No nausea or vomiting reported.  Foley catheter was inserted.  1000 cc was removed and then clamped.  Following this 700 mL were removed, and the patient continues to have good urine flow.  The basic metabolic panel shows the CO2 to be slightly low at 21, the sodium is low at 133, the glucose is slightly elevated at 127.  The BUN is 61, and the creatinine is 7.02.  The anion gap is elevated at 17.  Complete blood count shows the white blood cells to be elevated at 16,700.  The abnormal immature granulocytes are 0.08 which is elevated.  ua neg for infection. Hgb on dip stick probably related to trauma of cath.  Pt seen with me by Dr Regenia Skeeter. Will discuss admission with Hospitalist.    Final diagnoses:  Acute kidney injury [facility])  Urinary retention    ED Discharge Orders    None       Lily Kocher, PA-C 08/16/19 1606    Noemi Chapel, MD 08/16/19 [phone]

## 2019-08-16 NOTE — ED Triage Notes (Signed)
Pt sent to ED by PCP due to unable to urinate since Saturday. Pt states he has only had "a few drops" since Saturday.

## 2019-08-16 NOTE — Consult Note (Addendum)
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  Best management of this is to allow him to diurese without replacing his fluids unless he becomes orthostatic.  Would recommend that he be monitored closely for orthostasis, allowed to drink when thirsty, check serial electrolytes (q6 initially) and get a renal u/s when able.  His catheter will need to stay for a minimum of 7 days, and likely longer.  Once he has normalized and he is discharged home, he can be set-up for a voiding trial in urology clinic.   Addendum -  Paged consulting hospitalist several times to discuss case unsuccessfully.    I have requested an appointment for this patient in [place] at [facility] where he has been seen before in 10 days for discussion of catheter management and a trial of void.  Please contact me directly with additional questions/concerns.   [phone]

## 2019-08-16 NOTE — ED Notes (Signed)
ED TO INPATIENT HANDOFF REPORT  ED Nurse Name and Phone #: [phone]  S Name/Age/Gender Phillip Kirk 68 y.o. male Room/Bed: APFT20/APFT20  Code Status   Code Status: Not on file  Home/SNF/Other Home Patient oriented to: situation Is this baseline? Yes   Triage Complete: Triage complete  Chief Complaint UNABLE TO VOID  Triage Note Pt sent to ED by PCP due to unable to urinate since Saturday. Pt states he has only had "a few drops" since Saturday.    Allergies No Known Allergies  Level of Care/Admitting Diagnosis ED Disposition    ED Disposition Condition Comment   Admit  Hospital Area: [facility] [id number]  Level of Care: Med-Surg [16]  Covid Evaluation: Asymptomatic Screening Protocol (No Symptoms)  Diagnosis: Acute renal failure, unspecified acute renal failure type [facility]) [id number]  Admitting Physician: Vashti Hey [id number]  Attending Physician: Vashti Hey [id number]  Estimated length of stay: past midnight tomorrow  Certification:: I certify this patient will need inpatient services for at least 2 midnights  PT Class (Do Not Modify): Inpatient [101]  PT Acc Code (Do Not Modify): Private [1]       B Medical/Surgery History Past Medical History:  Diagnosis Date  . Dysphagia 07/24/2017  . Essential hypertension, benign 07/24/2017  . GERD (gastroesophageal reflux disease)    Past Surgical History:  Procedure Laterality Date  . BIOPSY  07/24/2017   Procedure: BIOPSY;  Surgeon: Rogene Houston, MD;  [facility];  Service: Endoscopy;;  gastric esophagus  . CATARACT EXTRACTION    . CATARACT EXTRACTION Left   . ESOPHAGEAL DILATION N/A 07/24/2017   Procedure: ESOPHAGEAL DILATION;  Surgeon: Rogene Houston, MD;  [facility];  Service: Endoscopy;  Laterality: N/A;  . ESOPHAGEAL DILATION N/A 11/19/2017   Procedure: ESOPHAGEAL DILATION;  Surgeon: Rogene Houston, MD;  [facility];   Service: Endoscopy;  Laterality: N/A;  . ESOPHAGOGASTRODUODENOSCOPY N/A 07/24/2017   Procedure: ESOPHAGOGASTRODUODENOSCOPY (EGD);  Surgeon: Rogene Houston, MD;  [facility];  Service: Endoscopy;  Laterality: N/A;  . ESOPHAGOGASTRODUODENOSCOPY N/A 11/19/2017   Procedure: ESOPHAGOGASTRODUODENOSCOPY (EGD);  Surgeon: Rogene Houston, MD;  [facility];  Service: Endoscopy;  Laterality: N/A;  . rt eye surgery     trauma     A IV Location/Drains/Wounds Patient Lines/Drains/Airways Status   Active Line/Drains/Airways    Name:   Placement date:   Placement time:   Site:   Days:   Peripheral IV 08/16/19 Right Forearm   08/16/19    1705    Forearm   less than 1   Urethral Catheter sandra bethel rn Non-latex 14 Fr.   08/16/19    1530    Non-latex   less than 1          Intake/Output Last 24 hours  Intake/Output Summary (Last 24 hours) at 08/16/2019 2029 Last data filed at 08/16/2019 1857 Gross per 24 hour  Intake 500 ml  Output 2000 ml  Net -1500 ml    Labs/Imaging Results for orders placed or performed during the hospital encounter of 08/16/19 (from the past 48 hour(s))  Urinalysis, Routine w reflex microscopic     Status: Abnormal   Collection Time: 08/16/19 11:48 AM  Result Value Ref Range   Color, Urine YELLOW YELLOW   APPearance CLEAR CLEAR   Specific Gravity, Urine 1.011 1.005 - 1.030   pH 5.0 5.0 - 8.0   Glucose, UA NEGATIVE NEGATIVE mg/dL  Hgb urine dipstick LARGE (A) NEGATIVE   Bilirubin Urine NEGATIVE NEGATIVE   Ketones, ur NEGATIVE NEGATIVE mg/dL   Protein, ur NEGATIVE NEGATIVE mg/dL   Nitrite NEGATIVE NEGATIVE   Leukocytes,Ua NEGATIVE NEGATIVE   RBC / HPF >50 (H) 0 - 5 RBC/hpf   WBC, UA 0-5 0 - 5 WBC/hpf   Bacteria, UA NONE SEEN NONE SEEN   Mucus PRESENT     Comment: Performed at [facility], [address], [place], [place] [id number]  Basic metabolic panel     Status: Abnormal   Collection Time: 08/16/19  1:31 PM  Result Value Ref  Range   Sodium 133 (L) 135 - 145 mmol/L   Potassium 4.1 3.5 - 5.1 mmol/L   Chloride 95 (L) 98 - 111 mmol/L   CO2 21 (L) 22 - 32 mmol/L   Glucose, Bld 129 (H) 70 - 99 mg/dL   BUN 61 (H) 8 - 23 mg/dL   Creatinine, Ser 7.02 (H) 0.61 - 1.24 mg/dL   Calcium 8.9 8.9 - 10.3 mg/dL   GFR calc non Af Amer 7 (L) >60 mL/min   GFR calc Af Amer 8 (L) >60 mL/min   Anion gap 17 (H) 5 - 15    Comment: Performed at [facility], [address] [place], [place] [address]  CBC with Differential     Status: Abnormal   Collection Time: 08/16/19  1:31 PM  Result Value Ref Range   WBC 16.7 (H) 4.0 - 10.5 K/uL   RBC 5.39 4.22 - 5.81 MIL/uL   Hemoglobin 15.7 13.0 - 17.0 g/dL   HCT 48.4 39.0 - 52.0 %   MCV 89.8 80.0 - 100.0 fL   MCH 29.1 26.0 - 34.0 pg   MCHC 32.4 30.0 - 36.0 g/dL   RDW 13.9 11.5 - 15.5 %   Platelets 221 150 - 400 K/uL   nRBC 0.0 0.0 - 0.2 %   Neutrophils Relative % 81 %   Neutro Abs 13.6 (H) 1.7 - 7.7 K/uL   Lymphocytes Relative 8 %   Lymphs Abs 1.4 0.7 - 4.0 K/uL   Monocytes Relative 10 %   Monocytes Absolute 1.6 (H) 0.1 - 1.0 K/uL   Eosinophils Relative 0 %   Eosinophils Absolute 0.0 0.0 - 0.5 K/uL   Basophils Relative 0 %   Basophils Absolute 0.0 0.0 - 0.1 K/uL   Immature Granulocytes 1 %   Abs Immature Granulocytes 0.08 (H) 0.00 - 0.07 K/uL    Comment: Performed at [facility], [address] [place], [place] [address]  Magnesium     Status: None   Collection Time: 08/16/19  1:31 PM  Result Value Ref Range   Magnesium 1.9 1.7 - 2.4 mg/dL    Comment: Performed at [facility], [address], [place], [place] [address]   Dg Chest Portable 1 View  Result Date: 08/16/2019 CLINICAL DATA:  Acute kidney injury.  Urinary retention. EXAM: PORTABLE CHEST 1 VIEW COMPARISON:  05/17/2019 FINDINGS: The heart size and mediastinal contours are within normal limits. Both lungs are clear. The visualized skeletal structures are unremarkable. IMPRESSION: No active disease. Electronically  Signed   By: Constance Holster M.D.   On: 08/16/2019 16:28    Pending Labs Unresulted Labs (From admission, onward)    Start     Ordered   08/16/19 1513  SARS CORONAVIRUS 2 (TAT 6-24 HRS) Nasopharyngeal Nasopharyngeal Swab  (Asymptomatic/Tier 2 Patients Labs)  Once,   STAT    Question Answer Comment  Is this test for diagnosis or screening Screening   Symptomatic for COVID-19 as defined by CDC No   Hospitalized for COVID-19 No   Admitted to ICU for COVID-19 No   Previously tested for COVID-19 No   Resident in a congregate (group) care setting No   Employed in healthcare setting No      08/16/19 1513   Signed and Held  HIV Antibody (routine testing w rflx)  (HIV Antibody (Routine testing w reflex) panel)  Once,   R     Signed and Held   Signed and Held  Basic metabolic panel  Tomorrow morning,   R     Signed and Held   Signed and Held  CBC  Tomorrow morning,   R     Signed and Held          Vitals/Pain Today's Vitals   08/16/19 1146 08/16/19 1147 08/16/19 1900  BP: (!) 169/84  140/78  Pulse: 91  89  Resp: 16  (!) 23  Temp: 98.2 F (36.8 C)    TempSrc: Oral    SpO2: 99%  96%  Weight:  76.7 kg   Height:  5' 9.5" (1.765 m)   PainSc:  10-Worst pain ever     Isolation Precautions No active isolations  Medications Medications  sodium chloride 0.9 % bolus 500 mL (0 mLs Intravenous Stopped 08/16/19 2029)    Followed by  0.9 %  sodium chloride infusion (has no administration in time range)  morphine 4 MG/ML injection 4 mg (4 mg Intravenous Given 08/16/19 1341)  ondansetron (ZOFRAN) injection 4 mg (4 mg Intravenous Given 08/16/19 1341)    Mobility walks Low fall risk   Focused Assessments Renal Assessment Handoff:       R Recommendations: See Admitting Provider Note  Report given to:   Additional Notes:

## 2019-08-16 NOTE — H&P (Signed)
History and Physical:    Phillip Kirk   [id number] DOB: [birth year] DOA: 08/16/2019  Referring MD/provider: Dr Marshell Levan PCP: Lavella Lemons, PA   Patient coming from: Home  Chief Complaint: Inability to urinate for the last 3 days  History of Present Illness:   Phillip Kirk is an 68 y.o. male with past medical history significant for hypertension, esophageal stricture status post dilation 18 months ago, and BPH who was in his usual state of good health until 3 days ago and he noted he had an inability to urinate.  Patient thought this was related to an episode where he had chicken stuck in his throat 4 days ago.  This was subsequently was dislodged after he tried to drink a lot of water and beer to dislodge it, he was able to eventually vomited back up after about 24 hours.  However since then he has had inability to urinate.  He was initially forcing himself to drink a lot of liquids but in the past couple of days has decreased his fluid intake because he was unable to urinate.  Today patient had severe abdominal pain and could feel "hard knot" in his suprapubic area so came to the ED.  He has not had any fevers or chills.  No flank pain.  He had has had nausea and vomiting and anorexia over the past 24 hours.  ED Course:  The patient was noted to have a extremely distended bladder and a Foley catheter resulted in 1700 cc of urine output.  He was also noted to have a creatinine of 7.  Patient denies any known history of renal dysfunction.  ROS:   ROS   Review of Systems: General: No fever, chills, weight changes Skin: No rashes, lesions, wounds Eyes: no discharge, redness, pain HENT: no ear pain, hearing loss, drainage, tinnitus Endocrine: no heat/cold intolerance, no polyuria Respiratory: No cough,, shortness of breath, hemoptysis Cardiovascular: No palpitations, chest pain CNS: No numbness, dizziness, headache Musculoskeletal: No back pain, joint pain Blood/lymphatics: No  easy bruising, bleeding Mood/affect: No anxiety/depression    Past Medical History:   Past Medical History:  Diagnosis Date   Dysphagia 07/24/2017   Essential hypertension, benign 07/24/2017   GERD (gastroesophageal reflux disease)     Past Surgical History:   Past Surgical History:  Procedure Laterality Date   BIOPSY  07/24/2017   Procedure: BIOPSY;  Surgeon: Rogene Houston, MD;  [facility];  Service: Endoscopy;;  gastric esophagus   CATARACT EXTRACTION     CATARACT EXTRACTION Left    ESOPHAGEAL DILATION N/A 07/24/2017   Procedure: ESOPHAGEAL DILATION;  Surgeon: Rogene Houston, MD;  [facility];  Service: Endoscopy;  Laterality: N/A;   ESOPHAGEAL DILATION N/A 11/19/2017   Procedure: ESOPHAGEAL DILATION;  Surgeon: Rogene Houston, MD;  [facility];  Service: Endoscopy;  Laterality: N/A;   ESOPHAGOGASTRODUODENOSCOPY N/A 07/24/2017   Procedure: ESOPHAGOGASTRODUODENOSCOPY (EGD);  Surgeon: Rogene Houston, MD;  [facility];  Service: Endoscopy;  Laterality: N/A;   ESOPHAGOGASTRODUODENOSCOPY N/A 11/19/2017   Procedure: ESOPHAGOGASTRODUODENOSCOPY (EGD);  Surgeon: Rogene Houston, MD;  [facility];  Service: Endoscopy;  Laterality: N/A;   rt eye surgery     trauma    Social History:   Social History   Socioeconomic History   Marital status: Married    Spouse name: Not on file   Number of children: Not on file   Years of education: Not on  file   Highest education level: Not on file  Occupational History   Not on file  Social Needs   Financial resource strain: Not on file   Food insecurity    Worry: Not on file    Inability: Not on file   Transportation needs    Medical: Not on file    Non-medical: Not on file  Tobacco Use   Smoking status: Former Smoker    Packs/day: 1.00    Years: 28.00    Pack years: 28.00    Types: Cigarettes    Quit date: 10/05/1992    Years since quitting:  26.8   Smokeless tobacco: Former Systems developer    Types: Chew  Substance and Sexual Activity   Alcohol use: Yes    Comment: occasonally   Drug use: No   Sexual activity: Not on file  Lifestyle   Physical activity    Days per week: Not on file    Minutes per session: Not on file   Stress: Not on file  Relationships   Social connections    Talks on phone: Not on file    Gets together: Not on file    Attends religious service: Not on file    Active member of club or organization: Not on file    Attends meetings of clubs or organizations: Not on file    Relationship status: Not on file   Intimate partner violence    Fear of current or ex partner: Not on file    Emotionally abused: Not on file    Physically abused: Not on file    Forced sexual activity: Not on file  Other Topics Concern   Not on file  Social History Narrative   Not on file    Allergies   Patient has no known allergies.  Family history:   Family History  Problem Relation Age of Onset   Alzheimer's disease Sister    Breast cancer Sister    Breast cancer Sister     Current Medications:   Prior to Admission medications   Medication Sig Start Date End Date Taking? Authorizing Provider  acetaminophen (TYLENOL) 500 MG tablet Take 1,000 mg by mouth every 6 (six) hours as needed for moderate pain or headache.    [provider]  amLODipine (NORVASC) 5 MG tablet Take 5 mg by mouth daily.    [provider]  atorvastatin (LIPITOR) 40 MG tablet TAKE 1 TABLET BY MOUTH EVERY DAY 03/24/19   Herminio Commons, MD  finasteride (PROSCAR) 5 MG tablet Take 5 mg by mouth daily.    [provider]  FLOVENT HFA 220 MCG/ACT inhaler INAHLE 4 PUFFS BY MOUTH TWICE DAILY (DONT EAT OR DRINK FOR 3 HOURS AFTER 10/12/18   Vaughan Basta, Rona Ravens, NP  lisinopril (PRINIVIL,ZESTRIL) 40 MG tablet Take 40 mg by mouth daily.     [provider]  metoprolol tartrate (LOPRESSOR) 25 MG tablet TAKE 0.5 TABLETS  (12.5 MG TOTAL) BY MOUTH 2 (TWO) TIMES DAILY. 05/02/19 07/31/19  Herminio Commons, MD  pantoprazole (PROTONIX) 40 MG tablet TAKE 1 TABLET BY MOUTH DAILY BEFORE BREAKFAST 07/13/18   Rogene Houston, MD    Physical Exam:   Vitals:   08/16/19 1146 08/16/19 1147  BP: (!) 169/84   Pulse: 91   Resp: 16   Temp: 98.2 F (36.8 C)   TempSrc: Oral   SpO2: 99%   Weight:  76.7 kg  Height:  5' 9.5" (1.765 m)  Physical Exam: Blood pressure (!) 169/84, pulse 91, temperature 98.2 F (36.8 C), temperature source Oral, resp. rate 16, height 5' 9.5" (1.765 m), weight 76.7 kg, SpO2 99 %. Gen: Well-appearing male in good spirits lying in bed with his attentive wife at bedside. Eyes: Sclerae anicteric. Conjunctiva mildly injected. Chest: Moderately good air entry bilaterally with no adventitious sounds.  CV: Distant, regular, no audible murmurs. Abdomen: NABS, soft, nondistended, nontender. No tenderness to light or deep palpation. No rebound, no guarding.  No palpable bladder. Extremities: No edema.  Skin: Warm and dry. No rashes, lesions or wounds. Neuro: Alert and oriented times 3; grossly nonfocal. Psych: Patient is cooperative, logical and coherent with appropriate mood and affect.  Data Review:    Labs: Basic Metabolic Panel: Recent Labs  Lab 08/16/19 1331  NA 133*  K 4.1  CL 95*  CO2 21*  GLUCOSE 129*  BUN 61*  CREATININE 7.02*  CALCIUM 8.9   Liver Function Tests: No results for input(s): AST, ALT, ALKPHOS, BILITOT, PROT, ALBUMIN in the last 168 hours. No results for input(s): LIPASE, AMYLASE in the last 168 hours. No results for input(s): AMMONIA in the last 168 hours. CBC: Recent Labs  Lab 08/16/19 1331  WBC 16.7*  NEUTROABS 13.6*  HGB 15.7  HCT 48.4  MCV 89.8  PLT 221   Cardiac Enzymes: No results for input(s): CKTOTAL, CKMB, CKMBINDEX, TROPONINI in the last 168 hours.  BNP (last 3 results) No results for input(s): PROBNP in the last 8760  hours. CBG: No results for input(s): GLUCAP in the last 168 hours.  Urinalysis    Component Value Date/Time   COLORURINE YELLOW 08/16/2019 1148   APPEARANCEUR CLEAR 08/16/2019 1148   LABSPEC 1.011 08/16/2019 1148   PHURINE 5.0 08/16/2019 1148   GLUCOSEU NEGATIVE 08/16/2019 1148   HGBUR LARGE (A) 08/16/2019 1148   BILIRUBINUR NEGATIVE 08/16/2019 [place] 08/16/2019 [place] 08/16/2019 1148   NITRITE NEGATIVE 08/16/2019 1148   LEUKOCYTESUR NEGATIVE 08/16/2019 1148      Radiographic Studies: No results found.  EKG: EKG is ordered and pending   Assessment/Plan:   Principal Problem:   Acute renal failure (HCC) Active Problems:   Essential hypertension, benign   Esophageal stricture   GERD (gastroesophageal reflux disease)   BPH (benign prostatic hyperplasia)   68 year old man presents with acute bladder outlet obstruction most likely secondary to BPH.  He is now in acute renal failure with a creatinine of 7 although Foley catheter has resulted in 1700 cc of urine output.  ARF Creatinine should improve quickly and hopefully get back to normal with resolution of urinary tract obstruction. Renal ultrasound ordered for the morning Will need follow I's and O's carefully to avoid prerenal renal dysfunction given likely postobstructive diuresis.  POST OBSTRUCTIVE ARF 1700 cc of urine was drained from the bladder in the ED. Will need to follow urine output very closely as he may well have a postobstructive diuresis. We will need to match I's and O's very carefully.  DYSPHAGIA Patient with known esophageal stricture status post dilation a year and a half ago. He choked on a piece of chicken earlier this week Will ask for a pured diet GI consultation for another dilation requested. Continue PPI  HYPONATREMIA Possibly intravascularly volume depleted we will treat with normal saline versus alteration in salt and water handling given acute  significant renal failure. Repeat sodium in the morning  CAD Patient with known history of an MI which  was found incidentally on a preoperative echocardiogram. No chest pain, has never been under the care of a cardiologist. Patient should be started on aspirin once his hematuria is under better control He is already on a beta-blocker which I am continuing. Will order echocardiogram  HTN Continue amlodipine and metoprolol We will hold lisinopril until kidney function has resolved as patient may develop intravascular volume depletion if he does have a significant postobstructive diuresis.  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 He denies that he is ever had DTs or complicated withdrawal however admits that it has been several years since he has gone a week without drinking. Notes his last drink was 3 days ago when he does not feel shaky or craving it at all at present. At this point I will not place patient on CIWA protocol as there is no hypertension tachycardia or other signs of hyperadrenergic state. Can place patient on Librium taper or CIWA protocol if these parameters change.  BPH Patient has a Foley in place and will likely need to go home with that. Can consult urology either as an inpatient or outpatient for more definitive treatment. Continue finasteride.   Other information:   DVT prophylaxis: Lovenox ordered. Code Status: Full code. Family Communication: Wife was at bedside throughout Disposition Plan: Home Consults called: GI for the morning Admission status: Inpatient  The medical decision making on this patient was of high complexity and the patient is at high risk for clinical deterioration, therefore this is a level 3 visit.   Dewaine Oats Tublu Kavitha [place] [facility]  If 7PM-7AM, please contact night-coverage [email or web address] Password [facility] 08/16/2019, 3:51 PM

## 2019-08-17 ENCOUNTER — Inpatient Hospital Stay (HOSPITAL_COMMUNITY): Payer: Managed Care, Other (non HMO)

## 2019-08-17 LAB — CBC
HCT: 42.8 % (ref 39.0–52.0)
Hemoglobin: 13.3 g/dL (ref 13.0–17.0)
MCH: 28.7 pg (ref 26.0–34.0)
MCHC: 31.1 g/dL (ref 30.0–36.0)
MCV: 92.2 fL (ref 80.0–100.0)
Platelets: 179 10*3/uL (ref 150–400)
RBC: 4.64 MIL/uL (ref 4.22–5.81)
RDW: 13.8 % (ref 11.5–15.5)
WBC: 10.2 10*3/uL (ref 4.0–10.5)
nRBC: 0 % (ref 0.0–0.2)

## 2019-08-17 LAB — BASIC METABOLIC PANEL
Anion gap: 7 (ref 5–15)
BUN: 29 mg/dL — ABNORMAL HIGH (ref 8–23)
CO2: 28 mmol/L (ref 22–32)
Calcium: 8.5 mg/dL — ABNORMAL LOW (ref 8.9–10.3)
Chloride: 105 mmol/L (ref 98–111)
Creatinine, Ser: 1.42 mg/dL — ABNORMAL HIGH (ref 0.61–1.24)
GFR calc Af Amer: 58 mL/min — ABNORMAL LOW (ref >60)
GFR calc non Af Amer: 50 mL/min — ABNORMAL LOW (ref >60)
Glucose, Bld: 98 mg/dL (ref 70–99)
Potassium: 4.2 mmol/L (ref 3.5–5.1)
Sodium: 140 mmol/L (ref 135–145)

## 2019-08-17 LAB — SARS CORONAVIRUS 2 (TAT 6-24 HRS): SARS Coronavirus 2: NEGATIVE

## 2019-08-17 LAB — HIV ANTIBODY (ROUTINE TESTING W REFLEX): HIV Screen 4th Generation wRfx: NONREACTIVE

## 2019-08-17 MED ORDER — ATORVASTATIN CALCIUM 40 MG PO TABS: 40.0000 mg | ORAL_TABLET | Freq: Every day | ORAL | 0 refills | Status: DC

## 2019-08-17 MED ORDER — METOPROLOL TARTRATE 25 MG PO TABS: 12.5000 mg | ORAL_TABLET | Freq: Two times a day (BID) | ORAL | 0 refills | Status: DC

## 2019-08-17 MED ORDER — FINASTERIDE 5 MG PO TABS: 5.0000 mg | ORAL_TABLET | Freq: Every day | ORAL | 1 refills | Status: DC

## 2019-08-17 MED ORDER — TAMSULOSIN HCL 0.4 MG PO CAPS: 0.4000 mg | ORAL_CAPSULE | Freq: Every day | ORAL | 1 refills | Status: DC

## 2019-08-17 MED ORDER — TAMSULOSIN HCL 0.4 MG PO CAPS: 0.4000 mg | ORAL_CAPSULE | Freq: Every day | ORAL | Status: DC

## 2019-08-17 NOTE — Discharge Summary (Addendum)
Physician Discharge Summary [facility]    Patient: Phillip Kirk                   Admit date: 08/16/2019   DOB: 1951-01-28             Discharge date:08/17/2019/10:07 AM [id number]                          PCP: Lavella Lemons, PA  Disposition: HOME  Recommendations for Outpatient Follow-up:   . Follow up: with Dr. Louis Meckel in 7-10 days, office will call for appointment and follow-up. [name] He is to follow with gastroenterology for evaluation.   Discharge Condition: Stable   Code Status:   Code Status: Full Code  Diet recommendation: Soft  heart healthy diet   Discharge Diagnoses:    Principal Problem:   Acute renal failure (Phillip Kirk) Active Problems:   Essential hypertension, benign   Esophageal stricture   GERD (gastroesophageal reflux disease)   BPH (benign prostatic hyperplasia)   History of Present Illness/ Hospital Course Phillip Kirk Summary:    Nicholaos Shankman is an 68 y.o. male with past medical history significant for hypertension, esophageal stricture status post dilation 18 months ago, and BPH who was in his usual state of good health until 3 days ago and he noted he had an inability to urinate.  Patient thought this was related to an episode where he had chicken stuck in his throat 4 days ago.  This was subsequently was dislodged after he tried to drink a lot of water and beer to dislodge it, he was able to eventually vomited back up after about 24 hours.  However since then he has had inability to urinate.  He was initially forcing himself to drink a lot of liquids but in the past couple of days has decreased his fluid intake because he was unable to urinate.  Today patient had severe abdominal pain and could feel "hard knot" in his suprapubic area so came to the ED.  He has not had any fevers or chills.  No flank pain.  He had has had nausea and vomiting and anorexia over the past 24 hours.  ED Course:  The patient was noted to have a extremely distended bladder  and a Foley catheter resulted in 1700 cc of urine output.  He was also noted to have a creatinine of 7.  Patient denies any known history of renal dysfunction.  Hospital course: The patient was seen and examined, remained stable, diuresed well with Foley catheter, creatinine continue to improve, improved from 7.02  >>> 1.42 , on 11/10he diuresed approximately 2 L, this morning 11/11 diuresed another 700 mL. WBC improved from 16.7-10.2, remained afebrile.  Blood pressure improved stable 127/62. Urologist Dr. Louis Meckel seen and evaluated the patient recommended follow-up as an outpatient.  To be discharged with Foley catheter for another 7 to 10 days. He recommended continue with finasteride and adding Flomax.  As far as dysphagia he continues to tolerate p.o., no difficulty with having breakfast this morning.  Recommended continue chopped food, soft diet if tolerated.  To follow-up as an outpatient for further evaluation by gastroenterologist  We will continue to hold his lisinopril due to acute elevation in BUN/creatinine, once back to baseline may be resumed as an outpatient.  His blood pressure remained stable.  Issues addressed on this admission, acute renal failure, urinary retention, due to likely BPH, dysphagia, mild hyponatremia transit, reactive leukocytosis, morbidities  of coronary artery disease/hypertension/alcohol use Addressed, home medication has been reviewed. Changes: Patient given prescription for Flomax, to continue finasteride, continue other home medications With exception of Protonix and lisinopril which was DC'd.  Discharge Instructions:   Discharge Instructions    Activity as tolerated - No restrictions   Complete by: As directed    Call MD for:  difficulty breathing, headache or visual disturbances   Complete by: As directed    Call MD for:  redness, tenderness, or signs of infection (pain, swelling, redness, odor or green/yellow discharge around incision site)    Complete by: As directed    Diet - low sodium heart healthy   Complete by: As directed    Discharge instructions   Complete by: As directed    Please follow-up with Dr. Louis Meckel urologist in 1 week. Continue your Foley catheter as instructed by nursing staff. Per urologist and needs to be in place till they evaluated approximately 7 to 10 days.  Please see gastroenterology as an outpatient for further evaluation of your esophageal issues and swallowing. You may continue soft diet for now.   Increase activity slowly   Complete by: As directed        Medication List    STOP taking these medications   lisinopril 40 MG tablet Commonly known as: ZESTRIL   omeprazole 40 MG capsule Commonly known as: PRILOSEC     TAKE these medications   acetaminophen 500 MG tablet Commonly known as: TYLENOL Take 1,000 mg by mouth every 6 (six) hours as needed for moderate pain or headache.   amLODipine 5 MG tablet Commonly known as: NORVASC Take 5 mg by mouth every morning.   aspirin EC 81 MG tablet Take 81 mg by mouth daily.   atorvastatin 40 MG tablet Commonly known as: LIPITOR Take 1 tablet (40 mg total) by mouth daily.   finasteride 5 MG tablet Commonly known as: PROSCAR Take 1 tablet (5 mg total) by mouth daily.   metoprolol tartrate 25 MG tablet Commonly known as: LOPRESSOR Take 0.5 tablets (12.5 mg total) by mouth 2 (two) times daily.   SLEEP AID PO Take 15 mLs by mouth at bedtime as needed (for sleep).   tamsulosin 0.4 MG Caps capsule Commonly known as: FLOMAX Take 1 capsule (0.4 mg total) by mouth daily.      Follow-up Information    Ardis Hughs, MD. Schedule an appointment as soon as possible for a visit in 10 day(s).   Specialty: Urology Contact information: [address] [place] [address] [phone]          No Known Allergies   Procedures /Studies:   Dg Chest Portable 1 View  Result Date: 08/16/2019 CLINICAL DATA:  Acute kidney  injury.  Urinary retention. EXAM: PORTABLE CHEST 1 VIEW COMPARISON:  05/17/2019 FINDINGS: The heart size and mediastinal contours are within normal limits. Both lungs are clear. The visualized skeletal structures are unremarkable. IMPRESSION: No active disease. Electronically Signed   By: Constance Holster M.D.   On: 08/16/2019 16:28    Subjective:   Patient was seen and examined 08/17/2019, 10:07 AM Patient stable today. No acute distress.  No issues overnight Stable for discharge.  Discharge Exam:    Vitals:   08/16/19 1900 08/16/19 2102 08/17/19 0604 08/17/19 0752  BP: 140/78 (!) 141/80 120/68 127/62  Pulse: 89 70 63 63  Resp: (!) 23 20 20    Temp:      TempSrc:      SpO2:  96% 97% 98%   Weight:  75.6 kg    Height:        General: Pt lying comfortably in bed & appears in no obvious distress. Cardiovascular: S1 & S2 heard, RRR, S1/S2 +. No murmurs, rubs, gallops or clicks. No JVD or pedal edema. Respiratory: Clear to auscultation without wheezing, rhonchi or crackles. No increased work of breathing. Abdominal:  Non-distended, non-tender & soft. No organomegaly or masses appreciated. Normal bowel sounds heard. CNS: Alert and oriented. No focal deficits. Extremities: no edema, no cyanosis    The results of significant diagnostics from this hospitalization (including imaging, microbiology, ancillary and laboratory) are listed below for reference.      Microbiology:   No results found for this or any previous visit (from the past 240 hour(s)).   Labs:   CBC: Recent Labs  Lab 08/16/19 1331 08/17/19 0557  WBC 16.7* 10.2  NEUTROABS 13.6*  --   HGB 15.7 13.3  HCT 48.4 42.8  MCV 89.8 92.2  PLT 221 0000000   Basic Metabolic Panel: Recent Labs  Lab 08/16/19 1331 08/17/19 0557  NA 133* 140  K 4.1 4.2  CL 95* 105  CO2 21* 28  GLUCOSE 129* 98  BUN 61* 29*  CREATININE 7.02* 1.42*  CALCIUM 8.9 8.5*  MG 1.9  --     Urinalysis    Component Value Date/Time    COLORURINE YELLOW 08/16/2019 1148   APPEARANCEUR CLEAR 08/16/2019 1148   LABSPEC 1.011 08/16/2019 1148   PHURINE 5.0 08/16/2019 1148   GLUCOSEU NEGATIVE 08/16/2019 1148   HGBUR LARGE (A) 08/16/2019 1148   BILIRUBINUR NEGATIVE 08/16/2019 1148   Panacea 08/16/2019 1148   PROTEINUR NEGATIVE 08/16/2019 1148   NITRITE NEGATIVE 08/16/2019 [place] 08/16/2019 1148    Time coordinating discharge: Over 40 minutes  SIGNED: Deatra James, MD, FACP, FHM. [facility],  Pager [phone]  If 7PM-7AM, please contact night-coverage [email or web address], Password [facility] 08/17/2019, 10:07 AM

## 2019-08-17 NOTE — Progress Notes (Signed)
Pt given discharge instructions, cargiver at bedside as well, both expressed understanding of follow up and new medications, both expressed understanding of foley instructions,

## 2019-08-19 ENCOUNTER — Telehealth: Payer: Self-pay | Admitting: Physician Assistant

## 2019-08-19 NOTE — Telephone Encounter (Signed)
° °  Neg COVID results given to wife Angelica

## 2019-11-11 ENCOUNTER — Telehealth: Payer: Self-pay | Admitting: Hematology

## 2019-11-11 NOTE — Telephone Encounter (Signed)
Received a new pt referral from [facility] for eval for lymphoma. Pt has been scheduled to see Dr. Irene Limbo on 2/10 at 11am. Appt date and time has been given to the pt's wife. Aware for her husband to arrive 15 minutes early.

## 2019-11-15 ENCOUNTER — Telehealth: Payer: Self-pay | Admitting: Hematology

## 2019-11-15 NOTE — Telephone Encounter (Signed)
Received a msg to reschedule Phillip Kirk' appt w/Dr. Irene Limbo. I cld and lft a vm w/the new appt date and time for 2/16 at 11am.

## 2019-11-16 ENCOUNTER — Ambulatory Visit: Payer: Managed Care, Other (non HMO) | Admitting: Hematology

## 2019-11-16 ENCOUNTER — Other Ambulatory Visit: Payer: Managed Care, Other (non HMO)

## 2019-11-22 ENCOUNTER — Inpatient Hospital Stay: Payer: Managed Care, Other (non HMO)

## 2019-11-22 ENCOUNTER — Inpatient Hospital Stay: Payer: Managed Care, Other (non HMO) | Admitting: Hematology

## 2019-12-27 ENCOUNTER — Ambulatory Visit (INDEPENDENT_AMBULATORY_CARE_PROVIDER_SITE_OTHER): Payer: Managed Care, Other (non HMO) | Admitting: Internal Medicine

## 2019-12-27 ENCOUNTER — Other Ambulatory Visit: Payer: Self-pay

## 2019-12-27 ENCOUNTER — Encounter (INDEPENDENT_AMBULATORY_CARE_PROVIDER_SITE_OTHER): Payer: Self-pay | Admitting: Internal Medicine

## 2019-12-27 VITALS — BP 124/73 | HR 75 | Temp 97.2°F | Ht 69.0 in | Wt 168.4 lb

## 2019-12-27 DIAGNOSIS — R1319 Other dysphagia: Secondary | ICD-10-CM

## 2019-12-27 DIAGNOSIS — R1310 Dysphagia, unspecified: Secondary | ICD-10-CM | POA: Diagnosis not present

## 2019-12-27 DIAGNOSIS — K219 Gastro-esophageal reflux disease without esophagitis: Secondary | ICD-10-CM | POA: Diagnosis not present

## 2019-12-27 DIAGNOSIS — R7989 Other specified abnormal findings of blood chemistry: Secondary | ICD-10-CM | POA: Insufficient documentation

## 2019-12-27 MED ORDER — MONTELUKAST SODIUM 10 MG PO TABS: 10.0000 mg | ORAL_TABLET | Freq: Every day | ORAL | 3 refills | Status: AC

## 2019-12-27 MED ORDER — FLUTICASONE PROPIONATE HFA 220 MCG/ACT IN AERO: 4.0000 | INHALATION_SPRAY | Freq: Two times a day (BID) | RESPIRATORY_TRACT | 0 refills | Status: DC

## 2019-12-27 NOTE — Progress Notes (Signed)
Presenting complaint;  History of GERD and eosinophilic esophagitis.  Database and subjective:  Patient is 69 year old Caucasian male who has chronic GERD and history of eosinophilic esophagitis diagnosed February 2019 when esophageal stricture was dilated to 15 mm.  He was then treated with fluticasone who was last seen in December 2019 and was still having some swallowing difficulty.  He was scheduled to undergo barium study but this never materialized. Most of the history is provided by his wife Angie who is accompanying the patient today. Patient states after his last dilation and topical therapy with fluticasone his swallowing improved for 4 to 6 months.  Lately he has been having difficulty every couple of days.  He has had 3-4 episodes of food impaction this year.  His wife says that he had food impaction with chicken for 3 days but did not come to emergency room.  He states he was not able to void.  Finally he came to emergency room.  He had lost 18 pounds.  He was found to have obstructive uropathy due to enlarged prostate.  He also had elevated serum creatinine.  Foley's cath was placed.  He had procedure done by his urologist yesterday.  He states Foley's catheter will be removed in 3 weeks or so. He says he is a slow eater.  He is chews his food thoroughly.  He feels heartburn is well controlled with medication.  He denies abdominal pain melena or rectal bleeding.  Reviewing was read records he has lost 9 pounds since his last visit of December 2019.  Current Medications: Outpatient Encounter Medications as of 12/27/2019  Medication Sig  . acetaminophen (TYLENOL) 500 MG tablet Take 1,000 mg by mouth every 6 (six) hours as needed for moderate pain or headache.  [name] amLODipine (NORVASC) 5 MG tablet Take 5 mg by mouth every morning.   [name] aspirin EC 81 MG tablet Take 81 mg by mouth daily.  [name] omeprazole (PRILOSEC) 40 MG capsule Take 40 mg by mouth daily.  . [DISCONTINUED] atorvastatin (LIPITOR)  40 MG tablet Take 1 tablet (40 mg total) by mouth daily. (Patient not taking: Reported on 12/27/2019)  . [DISCONTINUED] Doxylamine Succinate, Sleep, (SLEEP AID PO) Take 15 mLs by mouth at bedtime as needed (for sleep).  . [DISCONTINUED] finasteride (PROSCAR) 5 MG tablet Take 1 tablet (5 mg total) by mouth daily. (Patient not taking: Reported on 12/27/2019)  . [DISCONTINUED] metoprolol tartrate (LOPRESSOR) 25 MG tablet Take 0.5 tablets (12.5 mg total) by mouth 2 (two) times daily.  . [DISCONTINUED] tamsulosin (FLOMAX) 0.4 MG CAPS capsule Take 1 capsule (0.4 mg total) by mouth daily. (Patient not taking: Reported on 12/27/2019)   No facility-administered encounter medications on file as of 12/27/2019.     Objective: Blood pressure 124/73, pulse 75, temperature (!) 97.2 F (36.2 C), temperature source Temporal, height 5\' 9"  (1.753 m), weight 168 lb 6.4 oz (76.4 kg). Patient is alert and in no acute distress. He is wearing facial mask. He has small scar along medial aspect of left eye. Conjunctiva is pink. Sclera is nonicteric Oropharyngeal mucosa is normal. He has upper and lower dentures in place. No neck masses or thyromegaly noted. Cardiac exam with regular rhythm normal S1 and S2. No murmur or gallop noted. Lungs are clear to auscultation. Abdomen is symmetrical soft and nontender with organomegaly or masses. No LE edema or clubbing noted.  Labs/studies Results:  CBC Latest Ref Rng & Units 08/17/2019 08/16/2019  WBC 4.0 - 10.5 K/uL 10.2 16.7(H)  Hemoglobin 13.0 - 17.0 g/dL 13.3 15.7  Hematocrit 39.0 - 52.0 % 42.8 48.4  Platelets 150 - 400 K/uL 179 221    CMP Latest Ref Rng & Units 08/17/2019 08/16/2019  Glucose 70 - 99 mg/dL 98 129(H)  BUN 8 - 23 mg/dL 29(H) 61(H)  Creatinine 0.61 - 1.24 mg/dL 1.42(H) 7.02(H)  Sodium 135 - 145 mmol/L 140 133(L)  Potassium 3.5 - 5.1 mmol/L 4.2 4.1  Chloride 98 - 111 mmol/L 105 95(L)  CO2 22 - 32 mmol/L 28 21(L)  Calcium 8.9 - 10.3 mg/dL 8.5(L)  8.9      Assessment:  #1.  Dysphagia.  Patient has history of esophageal stricture secondary to eosinophilic esophagitis last dilated and treated with fluticasone over 2 years ago providing relief for few months.  I suspect his stricture has relapse.  If he does not respond to fluticasone and Singulair would consider esophagogastroduodenoscopy and if he does respond to therapy we will proceed with barium study.  #2.  GERD.  Omeprazole is working.  #3.  Elevated serum creatinine.  Secondary to obstructive uropathy.  Renal function improved with placement of Foley's catheter but did not return to normal.  Plan:  Montelukast 10 mg by mouth daily. Fluticasone 880 mcg twice daily for 6 weeks. Patient to call with progress report in 4 weeks.  If swallowing has improved significantly we will proceed with barium study otherwise bypass barium study and proceed with EGD. Patient will go to the lab for metabolic 7. Office visit in 3 months.

## 2019-12-27 NOTE — Patient Instructions (Signed)
Physician will call with results of blood test when completed. We will cancel barium study if swallowing difficulty has not significantly improved with fluticasone and Singulair.

## 2019-12-28 LAB — BASIC METABOLIC PANEL
BUN: 13 mg/dL (ref 7–25)
CO2: 31 mmol/L (ref 20–32)
Calcium: 9.4 mg/dL (ref 8.6–10.3)
Chloride: 100 mmol/L (ref 98–110)
Creat: 0.94 mg/dL (ref 0.70–1.25)
Glucose, Bld: 99 mg/dL (ref 65–139)
Potassium: 3.8 mmol/L (ref 3.5–5.3)
Sodium: 139 mmol/L (ref 135–146)

## 2020-01-10 ENCOUNTER — Telehealth (INDEPENDENT_AMBULATORY_CARE_PROVIDER_SITE_OTHER): Payer: Self-pay | Admitting: Internal Medicine

## 2020-01-10 MED ORDER — DSS 100 MG PO CAPS
100.00 | ORAL_CAPSULE | ORAL | Status: DC
Start: ? — End: 2020-01-10

## 2020-01-10 MED ORDER — MORPHINE SULFATE 2 MG/ML IJ SOLN
2.00 | INTRAMUSCULAR | Status: DC
Start: ? — End: 2020-01-10

## 2020-01-10 MED ORDER — PANTOPRAZOLE SODIUM 40 MG PO TBEC
40.00 | DELAYED_RELEASE_TABLET | ORAL | Status: DC
Start: 2020-01-13 — End: 2020-01-10

## 2020-01-10 MED ORDER — DERMACERIN EX CREA
1.00 | TOPICAL_CREAM | CUTANEOUS | Status: DC
Start: ? — End: 2020-01-10

## 2020-01-10 MED ORDER — ASPIRIN 81 MG PO TBEC
81.00 | DELAYED_RELEASE_TABLET | ORAL | Status: DC
Start: 2020-01-13 — End: 2020-01-10

## 2020-01-10 MED ORDER — ONDANSETRON HCL 4 MG/2ML IJ SOLN
4.00 | INTRAMUSCULAR | Status: DC
Start: ? — End: 2020-01-10

## 2020-01-10 MED ORDER — TRAZODONE HCL 50 MG PO TABS
50.00 | ORAL_TABLET | ORAL | Status: DC
Start: ? — End: 2020-01-10

## 2020-01-10 MED ORDER — THIAMINE HCL 100 MG/ML IJ SOLN
200.00 | INTRAMUSCULAR | Status: DC
Start: 2020-01-10 — End: 2020-01-10

## 2020-01-10 MED ORDER — NYSTATIN 100000 UNIT/GM EX OINT
1.00 | TOPICAL_OINTMENT | CUTANEOUS | Status: DC
Start: 2020-01-12 — End: 2020-01-10

## 2020-01-10 MED ORDER — FLUTICASONE FUROATE 200 MCG/ACT IN AEPB
1.00 | INHALATION_SPRAY | RESPIRATORY_TRACT | Status: DC
Start: 2020-01-13 — End: 2020-01-10

## 2020-01-10 MED ORDER — IPRATROPIUM-ALBUTEROL 0.5-2.5 (3) MG/3ML IN SOLN
3.00 | RESPIRATORY_TRACT | Status: DC
Start: ? — End: 2020-01-10

## 2020-01-10 MED ORDER — SODIUM CHLORIDE 0.9 % IV SOLN
500.00 | INTRAVENOUS | Status: DC
Start: ? — End: 2020-01-10

## 2020-01-10 MED ORDER — HYDRALAZINE HCL 20 MG/ML IJ SOLN
10.00 | INTRAMUSCULAR | Status: DC
Start: ? — End: 2020-01-10

## 2020-01-10 MED ORDER — IBUPROFEN 200 MG PO TABS
400.00 | ORAL_TABLET | ORAL | Status: DC
Start: ? — End: 2020-01-10

## 2020-01-10 MED ORDER — GENERIC EXTERNAL MEDICATION
1.00 | Status: DC
Start: 2020-01-10 — End: 2020-01-10

## 2020-01-10 MED ORDER — MONTELUKAST SODIUM 10 MG PO TABS
10.00 | ORAL_TABLET | ORAL | Status: DC
Start: 2020-01-13 — End: 2020-01-10

## 2020-01-10 MED ORDER — ACETAMINOPHEN 325 MG PO TABS
650.00 | ORAL_TABLET | ORAL | Status: DC
Start: ? — End: 2020-01-10

## 2020-01-10 MED ORDER — NITROGLYCERIN 0.4 MG SL SUBL
0.40 | SUBLINGUAL_TABLET | SUBLINGUAL | Status: DC
Start: ? — End: 2020-01-10

## 2020-01-10 MED ORDER — AMLODIPINE BESYLATE 5 MG PO TABS
5.00 | ORAL_TABLET | ORAL | Status: DC
Start: 2020-01-13 — End: 2020-01-10

## 2020-01-10 MED ORDER — ENOXAPARIN SODIUM 80 MG/0.8ML ~~LOC~~ SOLN
1.00 | SUBCUTANEOUS | Status: DC
Start: 2020-01-10 — End: 2020-01-10

## 2020-01-10 MED ORDER — POLYETHYLENE GLYCOL 3350 17 GM/SCOOP PO POWD
17.00 | ORAL | Status: DC
Start: ? — End: 2020-01-10

## 2020-01-10 MED ORDER — ZOLPIDEM TARTRATE 5 MG PO TABS
5.00 | ORAL_TABLET | ORAL | Status: DC
Start: ? — End: 2020-01-10

## 2020-01-10 MED ORDER — GUAIFENESIN ER 600 MG PO TB12
600.00 | ORAL_TABLET | ORAL | Status: DC
Start: ? — End: 2020-01-10

## 2020-01-10 MED ORDER — METOPROLOL TARTRATE 25 MG PO TABS
12.50 | ORAL_TABLET | ORAL | Status: DC
Start: 2020-01-12 — End: 2020-01-10

## 2020-01-10 MED ORDER — GENERIC EXTERNAL MEDICATION
Status: DC
Start: ? — End: 2020-01-10

## 2020-01-10 NOTE — Telephone Encounter (Signed)
Wife called with patients progress report - stated he is not feeling any better -

## 2020-01-12 MED ORDER — ENOXAPARIN SODIUM 40 MG/0.4ML ~~LOC~~ SOLN
40.00 | SUBCUTANEOUS | Status: DC
Start: 2020-01-13 — End: 2020-01-12

## 2020-01-12 MED ORDER — GENERIC EXTERNAL MEDICATION
2.00 | Status: DC
Start: 2020-01-13 — End: 2020-01-12

## 2020-01-12 MED ORDER — GENERIC EXTERNAL MEDICATION
Status: DC
Start: ? — End: 2020-01-12

## 2020-01-12 MED ORDER — LOPERAMIDE HCL 2 MG PO CAPS
2.00 | ORAL_CAPSULE | ORAL | Status: DC
Start: ? — End: 2020-01-12

## 2020-01-12 NOTE — Telephone Encounter (Signed)
Talked with the patient's wife. She states that he is currently in the [facility] . They are at their other house 3 hours away. He has some type of infection.  She states that his swallowing is not better. He is having trouble swallowing the medication while in the hospital.  Per Dr.Rehman's Ov note - cancel the Barium Study, If the Fluticasone and Singular do not work will precede with the EGD.  To review with Dr.Rehman.

## 2020-01-12 NOTE — Telephone Encounter (Signed)
Per Dr.Rehman the patient should have the EGD with moderate sedation. The wife has not been called back yet , forwarded to [facility] to arrange.  Dysphagia.

## 2020-01-17 NOTE — Telephone Encounter (Signed)
L/M for patient to call me so can schedule EGD

## 2020-01-18 ENCOUNTER — Encounter (INDEPENDENT_AMBULATORY_CARE_PROVIDER_SITE_OTHER): Payer: Self-pay | Admitting: *Deleted

## 2020-01-18 ENCOUNTER — Other Ambulatory Visit (INDEPENDENT_AMBULATORY_CARE_PROVIDER_SITE_OTHER): Payer: Self-pay | Admitting: *Deleted

## 2020-01-18 NOTE — Telephone Encounter (Signed)
TCS/EGD sch'd 02/15/20, instructions mailed

## 2020-01-18 NOTE — Telephone Encounter (Signed)
Please call patient's wife [phone]- he has some kind of infection they think he needs TCS

## 2020-01-18 NOTE — Telephone Encounter (Signed)
Per Dr.Rehman after reviewing his OV note the patient will need to have bothe the EGD /TCS.

## 2020-01-18 NOTE — Telephone Encounter (Signed)
LM for patient to call me to schedule.

## 2020-01-19 ENCOUNTER — Other Ambulatory Visit (INDEPENDENT_AMBULATORY_CARE_PROVIDER_SITE_OTHER): Payer: Self-pay | Admitting: *Deleted

## 2020-01-19 DIAGNOSIS — R1310 Dysphagia, unspecified: Secondary | ICD-10-CM

## 2020-01-19 DIAGNOSIS — R1319 Other dysphagia: Secondary | ICD-10-CM

## 2020-01-19 DIAGNOSIS — R634 Abnormal weight loss: Secondary | ICD-10-CM

## 2020-01-20 ENCOUNTER — Telehealth (INDEPENDENT_AMBULATORY_CARE_PROVIDER_SITE_OTHER): Payer: Self-pay | Admitting: *Deleted

## 2020-01-20 MED ORDER — PLENVU 140 G PO SOLR: 1.0000 | Freq: Once | ORAL | 0 refills | Status: AC

## 2020-01-20 NOTE — Telephone Encounter (Signed)
Patient needs Plenvu (copay card) ° °

## 2020-01-27 ENCOUNTER — Other Ambulatory Visit: Payer: Self-pay

## 2020-01-27 ENCOUNTER — Inpatient Hospital Stay: Payer: Managed Care, Other (non HMO) | Attending: Hematology and Oncology | Admitting: Hematology and Oncology

## 2020-01-27 ENCOUNTER — Inpatient Hospital Stay: Payer: Managed Care, Other (non HMO)

## 2020-01-27 VITALS — BP 132/67 | HR 78 | Temp 98.7°F | Resp 18 | Ht 69.0 in | Wt 165.3 lb

## 2020-01-27 DIAGNOSIS — I10 Essential (primary) hypertension: Secondary | ICD-10-CM | POA: Diagnosis not present

## 2020-01-27 DIAGNOSIS — Z818 Family history of other mental and behavioral disorders: Secondary | ICD-10-CM

## 2020-01-27 DIAGNOSIS — K219 Gastro-esophageal reflux disease without esophagitis: Secondary | ICD-10-CM

## 2020-01-27 DIAGNOSIS — R972 Elevated prostate specific antigen [PSA]: Secondary | ICD-10-CM

## 2020-01-27 DIAGNOSIS — D479 Neoplasm of uncertain behavior of lymphoid, hematopoietic and related tissue, unspecified: Secondary | ICD-10-CM | POA: Diagnosis present

## 2020-01-27 DIAGNOSIS — Z87891 Personal history of nicotine dependence: Secondary | ICD-10-CM

## 2020-01-27 DIAGNOSIS — N400 Enlarged prostate without lower urinary tract symptoms: Secondary | ICD-10-CM

## 2020-01-27 DIAGNOSIS — Z803 Family history of malignant neoplasm of breast: Secondary | ICD-10-CM

## 2020-01-27 DIAGNOSIS — Z9079 Acquired absence of other genital organ(s): Secondary | ICD-10-CM

## 2020-01-27 DIAGNOSIS — Z596 Low income: Secondary | ICD-10-CM

## 2020-01-27 DIAGNOSIS — R897 Abnormal histological findings in specimens from other organs, systems and tissues: Secondary | ICD-10-CM

## 2020-01-27 DIAGNOSIS — Z79899 Other long term (current) drug therapy: Secondary | ICD-10-CM | POA: Diagnosis not present

## 2020-01-27 LAB — CMP (CANCER CENTER ONLY)
ALT: 14 U/L (ref 0–44)
AST: 17 U/L (ref 15–41)
Albumin: 3.5 g/dL (ref 3.5–5.0)
Alkaline Phosphatase: 85 U/L (ref 38–126)
Anion gap: 10 (ref 5–15)
BUN: 13 mg/dL (ref 8–23)
CO2: 28 mmol/L (ref 22–32)
Calcium: 9.3 mg/dL (ref 8.9–10.3)
Chloride: 104 mmol/L (ref 98–111)
Creatinine: 1 mg/dL (ref 0.61–1.24)
GFR, Est AFR Am: >60 mL/min (ref >60)
GFR, Estimated: >60 mL/min (ref >60)
Glucose, Bld: 125 mg/dL — ABNORMAL HIGH (ref 70–99)
Potassium: 3.8 mmol/L (ref 3.5–5.1)
Sodium: 142 mmol/L (ref 135–145)
Total Bilirubin: 0.3 mg/dL (ref 0.3–1.2)
Total Protein: 7 g/dL (ref 6.5–8.1)

## 2020-01-27 LAB — CBC WITH DIFFERENTIAL (CANCER CENTER ONLY)
Abs Immature Granulocytes: 0.02 10*3/uL (ref 0.00–0.07)
Basophils Absolute: 0 10*3/uL (ref 0.0–0.1)
Basophils Relative: 1 %
Eosinophils Absolute: 0.1 10*3/uL (ref 0.0–0.5)
Eosinophils Relative: 1 %
HCT: 40.9 % (ref 39.0–52.0)
Hemoglobin: 13.3 g/dL (ref 13.0–17.0)
Immature Granulocytes: 0 %
Lymphocytes Relative: 24 %
Lymphs Abs: 1.7 10*3/uL (ref 0.7–4.0)
MCH: 28.9 pg (ref 26.0–34.0)
MCHC: 32.5 g/dL (ref 30.0–36.0)
MCV: 88.7 fL (ref 80.0–100.0)
Monocytes Absolute: 0.5 10*3/uL (ref 0.1–1.0)
Monocytes Relative: 7 %
Neutro Abs: 4.6 10*3/uL (ref 1.7–7.7)
Neutrophils Relative %: 67 %
Platelet Count: 314 10*3/uL (ref 150–400)
RBC: 4.61 MIL/uL (ref 4.22–5.81)
RDW: 14 % (ref 11.5–15.5)
WBC Count: 6.9 10*3/uL (ref 4.0–10.5)
nRBC: 0 % (ref 0.0–0.2)

## 2020-01-27 LAB — C-REACTIVE PROTEIN: CRP: 0.9 mg/dL (ref <1.0)

## 2020-01-27 LAB — SEDIMENTATION RATE: Sed Rate: 23 mm/hr — ABNORMAL HIGH (ref 0–16)

## 2020-01-27 LAB — SAVE SMEAR(SSMR), FOR PROVIDER SLIDE REVIEW

## 2020-01-27 LAB — LACTATE DEHYDROGENASE: LDH: 188 U/L (ref 98–192)

## 2020-01-27 NOTE — Progress Notes (Signed)
[place] [phone]   Fax:([phone]) [place] NOTE  Patient Care Team: Lavella Lemons, PA as PCP - General  Hematological/Oncological History # Lymphocytic Infiltrate of the Prostate 1) 11/03/2019: patient underwent prostate biopsy for elevated PSA. Biopsy showed benign prostate tissue with focal lymphocytic proliferation. These findings were concerning for involvement of lymphoproliferative disease 2) 11/28/2019: CT C/A/P showed enlarged prostate but no evidence of metastatic disease.  It was noted he had some tiny sclerotic lesions in the femoral head thought to be benign in nature as well as some groundglass opacities in the right upper lobe of the lung.  No lymphadenopathy was noted on this exam.  3) 02/02/2020: establish care with Dr. Lorenso Courier   CHIEF COMPLAINTS/PURPOSE OF CONSULTATION:  "Concern for lymphocytes on prostate biopsy "  HISTORY OF PRESENTING ILLNESS:  Phillip Kirk 69 y.o. male with medical history significant for HTN, GERD, and BPH who presents for evaluation of an abnormal prostate biopsy.   On review of the previous records Phillip Kirk underwent a biopsy of the prostate on 11/03/2019 due to concern for elevated PSA.  Biopsy was found to have benign prostatic tissue with focal lymphocytic infiltration.  There was concern that this could be either secondary to reactive changes from chronic inflammation or secondary involvement from a lymphoproliferative disease.  The patient underwent a CT scan of the chest abdomen and pelvis with contrast on 11/28/2019 at which time it was noted that he had an enlarged prostate but no evidence of metastatic disease.  It was noted he had some tiny sclerotic lesions in the femoral head thought to be benign in nature as well as some groundglass opacities in the right upper lobe of the lung.  No lymphadenopathy was noted on this exam.  Due to concern for this finding the patient was referred to hematology and  oncology for further evaluation and management.  On exam today Phillip Kirk notes that he has been doing okay.  He reports that he has lost little bit of weight due to not eating well and having a decrease in his appetite.  He notes that food hangs in his esophagus and that he is due for a dilation.  He reports that he has had this performed 2 times already and this would be his third time upcoming.  He notes that this has been especially difficult when trying to eat meat.  The patient is also grateful right now that he is urinating without a urinary catheter.  He reports that this was placed while he was in the hospital and he had to maintain this in place until Tuesday.  He notes that he is not currently straining and feels like he is incompletely voiding his bladder.  On further discussion he notes he has not noticed any enlarged lymph nodes in his neck or in his groin area.  He notes that he has not had any issues with fevers, chills, sweats, nausea, vomiting or diarrhea since his discharge from the hospital.  He reports that he smoked for approximately 25 to 30 years at 1 pack/day.  He currently does consume alcohol fairly regularly at 5-6 beers per day.  He reports that he does not have any family history of hematological malignancies, though he did have 2 sisters who had breast cancer.  On further discussion he has no additional questions or concerns.  A full 10 point ROS is listed below.  MEDICAL HISTORY:  Past Medical History:  Diagnosis Date  .  Dysphagia 07/24/2017  . Essential hypertension, benign 07/24/2017  . GERD (gastroesophageal reflux disease)     SURGICAL HISTORY: Past Surgical History:  Procedure Laterality Date  . BIOPSY  07/24/2017   Procedure: BIOPSY;  Surgeon: Rogene Houston, MD;  Location: [facility];  Service: Endoscopy;;  gastric esophagus  . CATARACT EXTRACTION    . CATARACT EXTRACTION Left   . ESOPHAGEAL DILATION N/A 07/24/2017   Procedure: ESOPHAGEAL DILATION;   Surgeon: Rogene Houston, MD;  Location: [facility];  Service: Endoscopy;  Laterality: N/A;  . ESOPHAGEAL DILATION N/A 11/19/2017   Procedure: ESOPHAGEAL DILATION;  Surgeon: Rogene Houston, MD;  [facility]: [facility];  Service: Endoscopy;  Laterality: N/A;  . ESOPHAGOGASTRODUODENOSCOPY N/A 07/24/2017   Procedure: ESOPHAGOGASTRODUODENOSCOPY (EGD);  Surgeon: Rogene Houston, MD;  [facility];  Service: Endoscopy;  Laterality: N/A;  . ESOPHAGOGASTRODUODENOSCOPY N/A 11/19/2017   Procedure: ESOPHAGOGASTRODUODENOSCOPY (EGD);  Surgeon: Rogene Houston, MD;  [facility];  Service: Endoscopy;  Laterality: N/A;  . rt eye surgery     trauma    SOCIAL HISTORY: Social History   Socioeconomic History  . Marital status: Married    Spouse name: Not on file  . Number of children: Not on file  . Years of education: Not on file  . Highest education level: Not on file  Occupational History  . Not on file  Tobacco Use  . Smoking status: Former Smoker    Packs/day: 1.00    Years: 28.00    Pack years: 28.00    Types: Cigarettes    Quit date: 10/05/1992    Years since quitting: 27.3  . Smokeless tobacco: Former Systems developer    Types: Chew  Substance and Sexual Activity  . Alcohol use: Yes    Comment: occasonally  . Drug use: No  . Sexual activity: Not on file  Other Topics Concern  . Not on file  Social History Narrative  . Not on file   Social Determinants of Health   Financial Resource Strain:   . Difficulty of Paying Living Expenses:   Food Insecurity:   . Worried About Charity fundraiser in the Last Year:   . Arboriculturist in the Last Year:   Transportation Needs:   . Film/video editor (Medical):   [name] Lack of Transportation (Non-Medical):   Physical Activity:   . Days of Exercise per Week:   . Minutes of Exercise per Session:   Stress:   . Feeling of Stress :   Social Connections:   . Frequency of Communication with Friends and Family:   .  Frequency of Social Gatherings with Friends and Family:   . Attends Religious Services:   . Active Member of Clubs or Organizations:   . Attends Archivist Meetings:   [name] Marital Status:   Intimate Partner Violence:   . Fear of Current or Ex-Partner:   . Emotionally Abused:   [name] Physically Abused:   . Sexually Abused:     FAMILY HISTORY: Family History  Problem Relation Age of Onset  . Alzheimer's disease Sister   . Breast cancer Sister   . Breast cancer Sister     ALLERGIES:  has No Known Allergies.  MEDICATIONS:  Current Outpatient Medications  Medication Sig Dispense Refill  . acetaminophen (TYLENOL) 500 MG tablet Take 1,000 mg by mouth every 6 (six) hours as needed for moderate pain or headache.    [name] amLODipine (NORVASC) 5  MG tablet Take 5 mg by mouth every morning.     [name] aspirin EC 81 MG tablet Take 81 mg by mouth daily.    . fluticasone (FLOVENT HFA) 220 MCG/ACT inhaler Inhale 4 puffs into the lungs 2 (two) times daily. 4 Inhaler 0  . montelukast (SINGULAIR) 10 MG tablet Take 1 tablet (10 mg total) by mouth daily. 90 tablet 3  . omeprazole (PRILOSEC) 40 MG capsule Take 40 mg by mouth daily.     No current facility-administered medications for this visit.    REVIEW OF SYSTEMS:   Constitutional: ( - ) fevers, ( - )  chills , ( - ) night sweats Eyes: ( - ) blurriness of vision, ( - ) double vision, ( - ) watery eyes Ears, nose, mouth, throat, and face: ( - ) mucositis, ( - ) sore throat Respiratory: ( - ) cough, ( - ) dyspnea, ( - ) wheezes Cardiovascular: ( - ) palpitation, ( - ) chest discomfort, ( - ) lower extremity swelling Gastrointestinal:  ( - ) nausea, ( - ) heartburn, ( - ) change in bowel habits Skin: ( - ) abnormal skin rashes Lymphatics: ( - ) new lymphadenopathy, ( - ) easy bruising Neurological: ( - ) numbness, ( - ) tingling, ( - ) new weaknesses Behavioral/Psych: ( - ) mood change, ( - ) new changes  All other systems were reviewed with the  patient and are negative.  PHYSICAL EXAMINATION: ECOG PERFORMANCE STATUS: 1 - Symptomatic but completely ambulatory  There were no vitals filed for this visit. There were no vitals filed for this visit.  GENERAL: well appearing elderly Caucasian male in NAD  SKIN: skin color, texture, turgor are normal, no rashes or significant lesions EYES: conjunctiva are pink and non-injected, sclera clear LYMPH:  no palpable lymphadenopathy in the cervical, axillary or supraclavicular lymph nodes LUNGS: clear to auscultation and percussion with normal breathing effort HEART: regular rate & rhythm and no murmurs and no lower extremity edema ABDOMEN: soft, non-tender, non-distended, normal bowel sounds. No HSM.  Musculoskeletal: no cyanosis of digits and no clubbing  PSYCH: alert & oriented x 3, fluent speech NEURO: no focal motor/sensory deficits  LABORATORY DATA:  I have reviewed the data as listed CBC Latest Ref Rng & Units 08/17/2019 08/16/2019  WBC 4.0 - 10.5 K/uL 10.2 16.7(H)  Hemoglobin 13.0 - 17.0 g/dL 13.3 15.7  Hematocrit 39.0 - 52.0 % 42.8 48.4  Platelets 150 - 400 K/uL 179 221    CMP Latest Ref Rng & Units 12/27/2019 08/17/2019 08/16/2019  Glucose 65 - 139 mg/dL 99 98 129(H)  BUN 7 - 25 mg/dL 13 29(H) 61(H)  Creatinine 0.70 - 1.25 mg/dL 0.94 1.42(H) 7.02(H)  Sodium 135 - 146 mmol/L 139 140 133(L)  Potassium 3.5 - 5.3 mmol/L 3.8 4.2 4.1  Chloride 98 - 110 mmol/L 100 105 95(L)  CO2 20 - 32 mmol/L 31 28 21(L)  Calcium 8.6 - 10.3 mg/dL 9.4 8.5(L) 8.9     PATHOLOGY: Outside pathology report from 11/28/2019 reviewed. Findings show benign prostate tissue with focal lymphocytic proliferation.  BLOOD FILM: Review of the peripheral blood smear showed normal appearing white cells with neutrophils that were appropriately lobated and granulated. There was no predominance of bi-lobed or hyper-segmented neutrophils appreciated. No Dohle bodies were noted. There was no left shifting, immature  forms or blasts noted. Lymphocytes remain normal in size without any predominance of large granular lymphocytes, though rare smudge cell were noted. Red cells show  no anisopoikilocytosis, macrocytes , microcytes or polychromasia. There were no schistocytes, target cells, echinocytes, acanthocytes, dacrocytes, or stomatocytes.There was no rouleaux formation, nucleated red cells, or intra-cellular inclusions noted. The platelets are normal in size, shape, and color without any clumping evident.  RADIOGRAPHIC STUDIES: I have personally reviewed the radiological images as listed and agreed with the findings in the report. No results found.  ASSESSMENT & PLAN Phillip Kirk 69 y.o. male with medical history significant for HTN, GERD, and BPH who presents for evaluation of an abnormal prostate biopsy.  After review of the pathology report, the outside imaging, and discussion with the patient his findings are most concerning for lymphocytic infiltrate of the prostate.  At this time the CT scan is reassuring that there is no systemic metastatic disease or increase in lymphadenopathy.  At this time I would favor a reactive lymphocytosis in the prostate due to local inflammation.  We will be thorough and attempt to rule out hematological malignancy with a flow cytometry, review of peripheral blood film, and inflammatory markers.  In the event the patient were to have findings concerning for hematological malignancy we will have him return to clinic for further evaluation and management.  # Lymphocytic Infiltrate of the Prostate -- today will order CMP, CBC, and will review peripheral blood film (rare smudge cell noted) -- will further assess with flow cytometry, ESR, CRP, and LDH  --findings most consistent with localized inflammation, though we will attempt to r/o any malignant process. CT scan from 11/28/2019 is reassuring.  --RTC PRN is any of the above studies are concerning for a malignant process  No orders  of the defined types were placed in this encounter.   All questions were answered. The patient knows to call the clinic with any problems, questions or concerns.  A total of more than 60 minutes were spent on this encounter and over half of that time was spent on counseling and coordination of care as outlined above.   Ledell Peoples, MD Department of Hematology/Oncology [place] [facility] Phone: [phone] Pager: [phone] Email: [email or web address]  01/27/2020 8:23 AM

## 2020-01-30 ENCOUNTER — Other Ambulatory Visit: Payer: Self-pay | Admitting: Physician Assistant

## 2020-01-30 DIAGNOSIS — M5126 Other intervertebral disc displacement, lumbar region: Secondary | ICD-10-CM

## 2020-01-30 LAB — SURGICAL PATHOLOGY

## 2020-01-31 LAB — FLOW CYTOMETRY

## 2020-02-02 ENCOUNTER — Encounter: Payer: Self-pay | Admitting: Hematology and Oncology

## 2020-02-08 ENCOUNTER — Other Ambulatory Visit: Payer: Self-pay

## 2020-02-08 ENCOUNTER — Ambulatory Visit
Admission: RE | Admit: 2020-02-08 | Discharge: 2020-02-08 | Disposition: A | Discharge status: Home / Self Care | Payer: Managed Care, Other (non HMO) | Source: Ambulatory Visit | Attending: Physician Assistant | Admitting: Physician Assistant

## 2020-02-08 DIAGNOSIS — M5126 Other intervertebral disc displacement, lumbar region: Secondary | ICD-10-CM

## 2020-02-08 MED ORDER — METHYLPREDNISOLONE ACETATE 40 MG/ML INJ SUSP (RADIOLOG
120.0000 mg | Freq: Once | INTRAMUSCULAR | Status: AC
  Administered 2020-02-08: 120 mg via EPIDURAL

## 2020-02-08 MED ORDER — IOPAMIDOL (ISOVUE-M 200) INJECTION 41%
1.0000 mL | Freq: Once | INTRAMUSCULAR | Status: AC
  Administered 2020-02-08: 1 mL via EPIDURAL

## 2020-02-08 NOTE — Discharge Instructions (Signed)

## 2020-02-13 ENCOUNTER — Other Ambulatory Visit (HOSPITAL_COMMUNITY)
Admission: RE | Admit: 2020-02-13 | Discharge: 2020-02-13 | Disposition: A | Discharge status: Home / Self Care | Payer: Managed Care, Other (non HMO) | Source: Ambulatory Visit | Attending: Internal Medicine | Admitting: Internal Medicine

## 2020-02-13 ENCOUNTER — Other Ambulatory Visit: Payer: Self-pay

## 2020-02-13 DIAGNOSIS — Z20822 Contact with and (suspected) exposure to covid-19: Secondary | ICD-10-CM | POA: Diagnosis not present

## 2020-02-13 DIAGNOSIS — Z01812 Encounter for preprocedural laboratory examination: Secondary | ICD-10-CM | POA: Insufficient documentation

## 2020-02-13 LAB — SARS CORONAVIRUS 2 (TAT 6-24 HRS): SARS Coronavirus 2: NEGATIVE

## 2020-02-15 ENCOUNTER — Encounter (HOSPITAL_COMMUNITY)
Admission: RE | Disposition: A | Discharge status: Home / Self Care | Payer: Self-pay | Source: Home / Self Care | Attending: Internal Medicine

## 2020-02-15 ENCOUNTER — Ambulatory Visit (HOSPITAL_COMMUNITY)
Admission: RE | Admit: 2020-02-15 | Discharge: 2020-02-15 | Disposition: A | Discharge status: Home / Self Care | Payer: Managed Care, Other (non HMO) | Attending: Internal Medicine | Admitting: Internal Medicine

## 2020-02-15 ENCOUNTER — Other Ambulatory Visit: Payer: Self-pay

## 2020-02-15 ENCOUNTER — Encounter (HOSPITAL_COMMUNITY): Payer: Self-pay | Admitting: Internal Medicine

## 2020-02-15 DIAGNOSIS — D175 Benign lipomatous neoplasm of intra-abdominal organs: Secondary | ICD-10-CM | POA: Diagnosis not present

## 2020-02-15 DIAGNOSIS — N189 Chronic kidney disease, unspecified: Secondary | ICD-10-CM | POA: Insufficient documentation

## 2020-02-15 DIAGNOSIS — Z87891 Personal history of nicotine dependence: Secondary | ICD-10-CM | POA: Insufficient documentation

## 2020-02-15 DIAGNOSIS — K644 Residual hemorrhoidal skin tags: Secondary | ICD-10-CM | POA: Insufficient documentation

## 2020-02-15 DIAGNOSIS — Z1211 Encounter for screening for malignant neoplasm of colon: Secondary | ICD-10-CM | POA: Diagnosis not present

## 2020-02-15 DIAGNOSIS — I129 Hypertensive chronic kidney disease with stage 1 through stage 4 chronic kidney disease, or unspecified chronic kidney disease: Secondary | ICD-10-CM | POA: Diagnosis not present

## 2020-02-15 DIAGNOSIS — K3189 Other diseases of stomach and duodenum: Secondary | ICD-10-CM | POA: Diagnosis not present

## 2020-02-15 DIAGNOSIS — K219 Gastro-esophageal reflux disease without esophagitis: Secondary | ICD-10-CM | POA: Insufficient documentation

## 2020-02-15 DIAGNOSIS — Z7951 Long term (current) use of inhaled steroids: Secondary | ICD-10-CM | POA: Insufficient documentation

## 2020-02-15 DIAGNOSIS — K229 Disease of esophagus, unspecified: Secondary | ICD-10-CM | POA: Diagnosis not present

## 2020-02-15 DIAGNOSIS — K5730 Diverticulosis of large intestine without perforation or abscess without bleeding: Secondary | ICD-10-CM | POA: Insufficient documentation

## 2020-02-15 DIAGNOSIS — Z7982 Long term (current) use of aspirin: Secondary | ICD-10-CM | POA: Insufficient documentation

## 2020-02-15 DIAGNOSIS — R1319 Other dysphagia: Secondary | ICD-10-CM

## 2020-02-15 DIAGNOSIS — K200 Eosinophilic esophagitis: Secondary | ICD-10-CM | POA: Diagnosis present

## 2020-02-15 DIAGNOSIS — Z79899 Other long term (current) drug therapy: Secondary | ICD-10-CM | POA: Insufficient documentation

## 2020-02-15 DIAGNOSIS — K222 Esophageal obstruction: Secondary | ICD-10-CM | POA: Insufficient documentation

## 2020-02-15 DIAGNOSIS — R634 Abnormal weight loss: Secondary | ICD-10-CM

## 2020-02-15 DIAGNOSIS — R1314 Dysphagia, pharyngoesophageal phase: Secondary | ICD-10-CM | POA: Diagnosis not present

## 2020-02-15 HISTORY — PX: ESOPHAGEAL BRUSHING: SHX6842

## 2020-02-15 HISTORY — PX: ESOPHAGEAL DILATION: SHX303

## 2020-02-15 HISTORY — PX: ESOPHAGOGASTRODUODENOSCOPY: SHX5428

## 2020-02-15 HISTORY — PX: COLONOSCOPY: SHX5424

## 2020-02-15 LAB — KOH PREP: Special Requests: NORMAL

## 2020-02-15 SURGERY — COLONOSCOPY
Anesthesia: Moderate Sedation

## 2020-02-15 MED ORDER — LIDOCAINE VISCOUS HCL 2 % MT SOLN
OROMUCOSAL | Status: DC | PRN
  Administered 2020-02-15: 4 mL via OROMUCOSAL

## 2020-02-15 MED ORDER — LIDOCAINE VISCOUS HCL 2 % MT SOLN
OROMUCOSAL | Status: AC
  Filled 2020-02-15: qty 15

## 2020-02-15 MED ORDER — FLUCONAZOLE 100 MG PO TABS
100.0000 mg | ORAL_TABLET | Freq: Every day | ORAL | 0 refills | Status: DC
Start: 2020-02-15 — End: 2020-02-27

## 2020-02-15 MED ORDER — MIDAZOLAM HCL 5 MG/5ML IJ SOLN
INTRAMUSCULAR | Status: AC
  Filled 2020-02-15: qty 10

## 2020-02-15 MED ORDER — SODIUM CHLORIDE 0.9 % IV SOLN
INTRAVENOUS | Status: DC
  Administered 2020-02-15: 1000 mL via INTRAVENOUS

## 2020-02-15 MED ORDER — STERILE WATER FOR IRRIGATION IR SOLN
Status: DC | PRN
  Administered 2020-02-15: 1.5 mL

## 2020-02-15 MED ORDER — MIDAZOLAM HCL 5 MG/5ML IJ SOLN
INTRAMUSCULAR | Status: DC | PRN
  Administered 2020-02-15 (×4): 2 mg via INTRAVENOUS

## 2020-02-15 MED ORDER — MEPERIDINE HCL 50 MG/ML IJ SOLN
INTRAMUSCULAR | Status: AC
  Filled 2020-02-15: qty 1

## 2020-02-15 MED ORDER — MEPERIDINE HCL 50 MG/ML IJ SOLN
INTRAMUSCULAR | Status: DC | PRN
  Administered 2020-02-15 (×3): 25 mg via INTRAVENOUS

## 2020-02-15 NOTE — Op Note (Addendum)
[facility] Patient Name: Phillip Kirk Procedure Date: 02/15/2020 8:16 AM MRN: [id number] Date of Birth: 02-Nov-1950 Attending MD: Hildred Laser , MD CSN: [id number] Age: 69 Admit Type: Outpatient Procedure:                Upper GI endoscopy Indications:              Eosinophilic esophagitis Providers:                Hildred Laser, MD, [place]is Peak B. Sharon Seller, RN, Raphael Gibney, Technician Referring MD:             Grace Bushy. Luciana Axe, PA Medicines:                Lidocaine spray, Meperidine 75 mg IV, Midazolam 8                            mg IV Complications:            No immediate complications. Estimated Blood Loss:     Estimated blood loss was minimal. Procedure:                Pre-Anesthesia Assessment:                           - Prior to the procedure, a History and Physical                            was performed, and patient medications and                            allergies were reviewed. The patient's tolerance of                            previous anesthesia was also reviewed. The risks                            and benefits of the procedure and the sedation                            options and risks were discussed with the patient.                            All questions were answered, and informed consent                            was obtained. Prior Anticoagulants: The patient has                            taken no previous anticoagulant or antiplatelet                            agents except for aspirin. ASA Grade Assessment: II                            -  A patient with mild systemic disease. After                            reviewing the risks and benefits, the patient was                            deemed in satisfactory condition to undergo the                            procedure.                           After obtaining informed consent, the endoscope was                            passed under direct vision. Throughout the                            procedure, the patient's blood pressure, pulse, and                            oxygen saturations were monitored continuously. The                            [id number] ID:[id number]) scope was introduced through the                            mouth, and advanced to the second part of duodenum.                            The upper GI endoscopy was accomplished without                            difficulty. The patient tolerated the procedure                            well. Scope In: 8:53:09 AM Scope Out: 9:05:21 AM Total Procedure Duration: 0 hours 12 minutes 12 seconds  Findings:      The hypopharynx was normal.      One benign-appearing, intrinsic severe stenosis was found 18 cm from the       incisors. This stenosis measured less than one cm (in length). The       stenosis was traversed after dilation. A TTS dilator was passed through       the scope. Dilation with a 12-13.5-15 mm balloon dilator was performed       to 12 mm and 13.5 mm. The dilation site was examined and showed moderate       mucosal disruption, moderate improvement in luminal narrowing and no       perforation.      Patchy plaques were found in the proximal esophagus, in the mid       esophagus and in the distal esophagus. Cells for cytology were obtained       by brushing.      The Z-line was irregular and was found 40 cm from the incisors.  A few erosions with no stigmata of recent bleeding were found in the       prepyloric region of the stomach.      The exam of the stomach was otherwise normal.      The duodenal bulb was normal.      There was a small lipoma in the second portion of the duodenum. Impression:               - Normal hypopharynx.                           - Benign-appearing esophageal stenosis. Dilated.                           - Esophageal plaques were found, consistent with                            candidiasis. Cells for cytology obtained.                           -  Z-line irregular, 40 cm from the incisors.                           - Erosive gastropathy with no stigmata of recent                            bleeding.                           - Normal duodenal bulb.                           - Duodenal lipoma. Moderate Sedation:      Moderate (conscious) sedation was administered by the endoscopy nurse       and supervised by the endoscopist. The following parameters were       monitored: oxygen saturation, heart rate, blood pressure, CO2       capnography and response to care. Total physician intraservice time was       13 minutes. Recommendation:           - Patient has a contact number available for                            emergencies. The signs and symptoms of potential                            delayed complications were discussed with the                            patient. Return to normal activities tomorrow.                            Written discharge instructions were provided to the                            patient.                           -  Resume previous diet today.                           - Continue present medications.                           - No aspirin, ibuprofen, naproxen, or other                            non-steroidal anti-inflammatory drugs for 3 days.                           - Diflucan (fluconazole) 100 mg PO daily for 2                            weeks.                           - Return to GI clinic in 4 weeks. Procedure Code(s):        --- Professional ---                           [phone], Esophagogastroduodenoscopy, flexible,                            transoral; with transendoscopic balloon dilation of                            esophagus (less than 30 mm diameter)                           G0500, Moderate sedation services provided by the                            same physician or other qualified health care                            professional performing a gastrointestinal                             endoscopic service that sedation supports,                            requiring the presence of an independent trained                            observer to assist in the monitoring of the                            patient's level of consciousness and physiological                            status; initial 15 minutes of intra-service time;                            patient age  5 years or older (additional time may                            be reported with [phone], as appropriate) Diagnosis Code(s):        --- Professional ---                           K22.2, Esophageal obstruction                           K22.9, Disease of esophagus, unspecified                           K22.8, Other specified diseases of esophagus                           K31.89, Other diseases of stomach and duodenum                           D17.5, Benign lipomatous neoplasm of                            intra-abdominal organs                           [id number], Eosinophilic esophagitis CPT copyright 2019 [employer]. All rights reserved. The codes documented in this report are preliminary and upon coder review may  be revised to meet current compliance requirements. Hildred Laser, MD Hildred Laser, MD 02/15/2020 9:51:24 AM This report has been signed electronically. Number of Addenda: 0

## 2020-02-15 NOTE — Op Note (Addendum)
[facility] Patient Name: Phillip Kirk Procedure Date: 02/15/2020 9:07 AM MRN: [id number] Date of Birth: Nov 10, 1950 Attending MD: Hildred Laser , MD CSN: [id number] Age: 69 Admit Type: Outpatient Procedure:                Colonoscopy Indications:              Screening for colorectal malignant neoplasm Providers:                Hildred Laser, MD, Otis Peak B. Sharon Seller, RN, Raphael Gibney, Technician Referring MD:             Grace Bushy. Luciana Axe , PA Medicines:                None Complications:            No immediate complications. Estimated Blood Loss:     Estimated blood loss: none. Procedure:                Pre-Anesthesia Assessment:                           - Prior to the procedure, a History and Physical                            was performed, and patient medications and                            allergies were reviewed. The patient's tolerance of                            previous anesthesia was also reviewed. The risks                            and benefits of the procedure and the sedation                            options and risks were discussed with the patient.                            All questions were answered, and informed consent                            was obtained. Prior Anticoagulants: The patient has                            taken no previous anticoagulant or antiplatelet                            agents except for aspirin. ASA Grade Assessment: II                            - A patient with mild systemic disease. After  reviewing the risks and benefits, the patient was                            deemed in satisfactory condition to undergo the                            procedure.                           - Prior to the procedure, a History and Physical                            was performed, and patient medications and                            allergies were reviewed. The patient's tolerance  of                            previous anesthesia was also reviewed. The risks                            and benefits of the procedure and the sedation                            options and risks were discussed with the patient.                            All questions were answered, and informed consent                            was obtained. After reviewing the risks and                            benefits, the patient was deemed in satisfactory                            condition to undergo the procedure.                           After obtaining informed consent, the colonoscope                            was passed under direct vision. Throughout the                            procedure, the patient's blood pressure, pulse, and                            oxygen saturations were monitored continuously. The                            [id number]) scope was introduced through  the anus and advanced to the the cecum, identified                            by appendiceal orifice and ileocecal valve. The                            colonoscopy was performed without difficulty. The                            patient tolerated the procedure well. The quality                            of the bowel preparation was adequate. The                            ileocecal valve, appendiceal orifice, and rectum                            were photographed. Scope In: 9:13:35 AM Scope Out: 9:28:43 AM Scope Withdrawal Time: 0 hours 9 minutes 4 seconds  Total Procedure Duration: 0 hours 15 minutes 8 seconds  Findings:      The perianal and digital rectal examinations were normal.      Scattered diverticula were found in the entire colon.      External hemorrhoids were found during retroflexion. The hemorrhoids       were small. Impression:               - Diverticulosis in the entire examined colon.                           - External hemorrhoids.                            - No specimens collected. Moderate Sedation:      Moderate (conscious) sedation was administered by the endoscopy nurse       and supervised by the endoscopist. The following parameters were       monitored: oxygen saturation, heart rate, blood pressure, CO2       capnography and response to care. Total physician intraservice time was       28 minutes. Recommendation:           - Patient has a contact number available for                            emergencies. The signs and symptoms of potential                            delayed complications were discussed with the                            patient. Return to normal activities tomorrow.                            Written discharge instructions were provided to the  patient.                           - Resume previous diet today.                           - Continue present medications.                           - See the other procedure note for documentation of                            additional recommendations.                           - Repeat colonoscopy in 10 years for screening                            purposes. Procedure Code(s):        --- Professional ---                           [phone], Colonoscopy, flexible; diagnostic, including                            collection of specimen(s) by brushing or washing,                            when performed (separate procedure)                           99153, Moderate sedation; each additional 15                            minutes intraservice time                           G0500, Moderate sedation services provided by the                            same physician or other qualified health care                            professional performing a gastrointestinal                            endoscopic service that sedation supports,                            requiring the presence of an independent trained                            observer  to assist in the monitoring of the                            patient's level of consciousness and physiological  status; initial 15 minutes of intra-service time;                            patient age 38 years or older (additional time may                            be reported with [phone], as appropriate) Diagnosis Code(s):        --- Professional ---                           Z12.11, Encounter for screening for malignant                            neoplasm of colon                           K64.4, Residual hemorrhoidal skin tags                           K57.30, Diverticulosis of large intestine without                            perforation or abscess without bleeding CPT copyright 2019 [employer]. All rights reserved. The codes documented in this report are preliminary and upon coder review may  be revised to meet current compliance requirements. Hildred Laser, MD Hildred Laser, MD 02/15/2020 9:55:03 AM This report has been signed electronically. Number of Addenda: 0

## 2020-02-15 NOTE — H&P (Signed)
Phillip Kirk is an 69 y.o. male.   Chief Complaint: Patient is here for esophagogastroduodenoscopy with esophageal dilation and colonoscopy. 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  He was seen in the office about 3 months ago with dysphagia.  He was treated with fluticasone and montelukast.  His dysphagia has not completely resolved.  He is therefore undergoing repeat evaluation.  He is also undergoing screening colonoscopy.  His last colonoscopy was 13 years ago and he did not have any polyps.  He denies melena or rectal bleeding diarrhea constipation. Patient is given history of weight loss.  He states he lost 28 pounds because of swallowing issues and also because he had kidney disease.  He says he has gained few pounds back. Aspirin is on hold. Family history is negative for CRC.  Past Medical History:  Diagnosis Date  . Chronic kidney disease   .  Eosinophilic esophagitis. 07/24/2017  . Essential hypertension, benign 07/24/2017  . GERD (gastroesophageal reflux disease)     Past Surgical History:  Procedure Laterality Date  . BIOPSY  07/24/2017   Procedure: BIOPSY;  Surgeon: Rogene Houston, MD;  Location: [facility];  Service: Endoscopy;;  gastric esophagus  . CATARACT EXTRACTION    . CATARACT EXTRACTION Left   . ESOPHAGEAL DILATION N/A 07/24/2017   Procedure: ESOPHAGEAL DILATION;  Surgeon: Rogene Houston, MD;  Location: [facility];  Service: Endoscopy;  Laterality: N/A;  . ESOPHAGEAL DILATION N/A 11/19/2017   Procedure: ESOPHAGEAL DILATION;  Surgeon: Rogene Houston, MD;  Location: [facility];  Service: Endoscopy;  Laterality: N/A;  . ESOPHAGOGASTRODUODENOSCOPY N/A 07/24/2017   Procedure: ESOPHAGOGASTRODUODENOSCOPY (EGD);  Surgeon: Rogene Houston, MD;  Location: [facility];   Service: Endoscopy;  Laterality: N/A;  . ESOPHAGOGASTRODUODENOSCOPY N/A 11/19/2017   Procedure: ESOPHAGOGASTRODUODENOSCOPY (EGD);  Surgeon: Rogene Houston, MD;  Location: [facility];  Service: Endoscopy;  Laterality: N/A;  . ROTATOR CUFF REPAIR Left   . rt eye surgery     trauma    Family History  Problem Relation Age of Onset  . Alzheimer's disease Sister   . Breast cancer Sister   . Breast cancer Sister    Social History:  reports that he quit smoking about 27 years ago. His smoking use included cigarettes. He has a 28.00 pack-year smoking history. His smokeless tobacco use includes chew. He reports current alcohol use. He reports that he does not use drugs.  Allergies: No Known Allergies  Medications Prior to Admission  Medication Sig Dispense Refill  . acetaminophen (TYLENOL) 500 MG tablet Take 500-1,000 mg by mouth every 6 (six) hours as needed for moderate pain or headache.     [name] amLODipine (NORVASC) 5 MG tablet Take 5 mg by mouth daily.     [name] aspirin EC 81 MG tablet Take 81 mg by mouth daily.    . fluticasone (FLOVENT HFA) 220 MCG/ACT inhaler Inhale 4 puffs into the lungs 2 (two) times daily. 4 Inhaler 0  . metoprolol tartrate (LOPRESSOR) 25 MG tablet Take 12.5 mg by mouth in the morning and at bedtime.    . montelukast (SINGULAIR) 10 MG tablet Take 1 tablet (10 mg total) by mouth daily. (Patient taking differently: Take 10 mg by mouth at bedtime. ) 90 tablet 3  . omeprazole (PRILOSEC) 40 MG capsule  Take 40 mg by mouth daily.    . tamsulosin (FLOMAX) 0.4 MG CAPS capsule Take 0.4 mg by mouth daily after breakfast.    . triamcinolone cream (KENALOG) 0.1 % Apply 1 application topically 3 (three) times daily as needed (psoriasis).     [name] zolpidem (AMBIEN) 5 MG tablet Take 5 mg by mouth at bedtime.      Results for orders placed or performed during the hospital encounter of 02/13/20 (from the past 48 hour(s))  SARS CORONAVIRUS 2 (TAT 6-24 HRS) Nasopharyngeal Nasopharyngeal Swab      Status: None   Collection Time: 02/13/20  1:20 PM   Specimen: Nasopharyngeal Swab  Result Value Ref Range   SARS Coronavirus 2 NEGATIVE NEGATIVE    Comment: (NOTE) SARS-CoV-2 target nucleic acids are NOT DETECTED. The SARS-CoV-2 RNA is generally detectable in upper and lower respiratory specimens during the acute phase of infection. Negative results do not preclude SARS-CoV-2 infection, do not rule out co-infections with other pathogens, and should not be used as the sole basis for treatment or other patient management decisions. Negative results must be combined with clinical observations, patient history, and epidemiological information. The expected result is Negative. Fact Sheet for Patients: SugarRoll.be Fact Sheet for Healthcare Providers: https://www.woods-mathews.com/ This test is not yet approved or cleared by the [place] FDA and  has been authorized for detection and/or diagnosis of SARS-CoV-2 by FDA under an Emergency Use Authorization (EUA). This EUA will remain  in effect (meaning this test can be used) for the duration of the COVID-19 declaration under Section 56 4(b)(1) of the Act, 21 U.S.C. section 360bbb-3(b)(1), unless the authorization is terminated or revoked sooner. Performed at [facility], [place] [address], [place], [place] [address]    No results found.  Review of Systems  Blood pressure (!) 146/79, pulse 64, temperature 98.1 F (36.7 C), temperature source Oral, resp. rate 11, height 5\' 9"  (1.753 m), weight 73 kg, SpO2 99 %. Physical Exam  Constitutional: He appears well-developed and well-nourished.  HENT:  Mouth/Throat: Oropharynx is clear and moist.  Patient has upper or lower dentures.  Eyes: Conjunctivae are normal. No scleral icterus.  Neck: No thyromegaly present.  Cardiovascular: Normal rate, regular rhythm and normal heart sounds.  No murmur heard. Respiratory: Effort normal and breath  sounds normal.  GI: Soft. He exhibits no distension and no mass. There is no abdominal tenderness.  Musculoskeletal:        General: No edema.  Lymphadenopathy:    He has no cervical adenopathy.  Neurological: He is alert.  Skin: Skin is warm and dry.     Assessment/Plan  Esophageal dysphagia in a patient with known eosinophilic esophagitis. Esophagogastroduodenoscopy with esophageal dilation and average risk screening colonoscopy.  Hildred Laser, MD 02/15/2020, 8:38 AM

## 2020-02-15 NOTE — Discharge Instructions (Signed)
No aspirin or NSAIDs for 3 days. Resume other medications as before. Diflucan/fluconazole 100 mg by mouth daily for 14 days. Resume usual diet.  Must chew food thoroughly and cut meat into small pieces before swallowing. No driving for 24 hours. Office visit in 4 weeks.  Office will call.   Colonoscopy, Adult, Care After This sheet gives you information about how to care for yourself after your procedure. Your doctor may also give you more specific instructions. If you have problems or questions, call your doctor. What can I expect after the procedure? After the procedure, it is common to have:  A small amount of blood in your poop (stool) for 24 hours.  Some gas.  Mild cramping or bloating in your belly (abdomen). Follow these instructions at home: Eating and drinking   Drink enough fluid to keep your pee (urine) pale yellow.  Follow instructions from your doctor about what you cannot eat or drink.  Return to your normal diet as told by your doctor. Avoid heavy or fried foods that are hard to digest. Activity  Rest as told by your doctor.  Do not sit for a long time without moving. Get up to take short walks every 1-2 hours. This is important. Ask for help if you feel weak or unsteady.  Return to your normal activities as told by your doctor. Ask your doctor what activities are safe for you. To help cramping and bloating:   Try walking around.  Put heat on your belly as told by your doctor. Use the heat source that your doctor recommends, such as a moist heat pack or a heating pad. ? Put a towel between your skin and the heat source. ? Leave the heat on for 20-30 minutes. ? Remove the heat if your skin turns bright red. This is very important if you are unable to feel pain, heat, or cold. You may have a greater risk of getting burned. General instructions  For the first 24 hours after the procedure: ? Do not drive or use machinery. ? Do not sign important  documents. ? Do not drink alcohol. ? Do your daily activities more slowly than normal. ? Eat foods that are soft and easy to digest.  Take over-the-counter or prescription medicines only as told by your doctor.  Keep all follow-up visits as told by your doctor. This is important. Contact a doctor if:  You have blood in your poop 2-3 days after the procedure. Get help right away if:  You have more than a small amount of blood in your poop.  You see large clumps of tissue (blood clots) in your poop.  Your belly is swollen.  You feel like you may vomit (nauseous).  You vomit.  You have a fever.  You have belly pain that gets worse, and medicine does not help your pain. Summary  After the procedure, it is common to have a small amount of blood in your poop. You may also have mild cramping and bloating in your belly.  For the first 24 hours after the procedure, do not drive or use machinery, do not sign important documents, and do not drink alcohol.  Get help right away if you have a lot of blood in your poop, feel like you may vomit, have a fever, or have more belly pain. This information is not intended to replace advice given to you by your health care provider. Make sure you discuss any questions you have with your health care provider.  Document Revised: 04/18/2019 Document Reviewed: 04/18/2019 Elsevier Patient Education  [place].   Hemorrhoids Hemorrhoids are swollen veins that may develop:  In the butt (rectum). These are called internal hemorrhoids.  Around the opening of the butt (anus). These are called external hemorrhoids. Hemorrhoids can cause pain, itching, or bleeding. Most of the time, they do not cause serious problems. They usually get better with diet changes, lifestyle changes, and other home treatments. What are the causes? This condition may be caused by:  Having trouble pooping (constipation).  Pushing hard (straining) to poop.  Watery  poop (diarrhea).  Pregnancy.  Being very overweight (obese).  Sitting for long periods of time.  Heavy lifting or other activity that causes you to strain.  Anal sex.  Riding a bike for a long period of time. What are the signs or symptoms? Symptoms of this condition include:  Pain.  Itching or soreness in the butt.  Bleeding from the butt.  Leaking poop.  Swelling in the area.  One or more lumps around the opening of your butt. How is this diagnosed? A doctor can often diagnose this condition by looking at the affected area. The doctor may also:  Do an exam that involves feeling the area with a gloved hand (digital rectal exam).  Examine the area inside your butt using a small tube (anoscope).  Order blood tests. This may be done if you have lost a lot of blood.  Have you get a test that involves looking inside the colon using a flexible tube with a camera on the end (sigmoidoscopy or colonoscopy). How is this treated? This condition can usually be treated at home. Your doctor may tell you to change what you eat, make lifestyle changes, or try home treatments. If these do not help, procedures can be done to remove the hemorrhoids or make them smaller. These may involve:  Placing rubber bands at the base of the hemorrhoids to cut off their blood supply.  Injecting medicine into the hemorrhoids to shrink them.  Shining a type of light energy onto the hemorrhoids to cause them to fall off.  Doing surgery to remove the hemorrhoids or cut off their blood supply. Follow these instructions at home: Eating and drinking   Eat foods that have a lot of fiber in them. These include whole grains, beans, nuts, fruits, and vegetables.  Ask your doctor about taking products that have added fiber (fibersupplements).  Reduce the amount of fat in your diet. You can do this by: ? Eating low-fat dairy products. ? Eating less red meat. ? Avoiding processed foods.  Drink enough  fluid to keep your pee (urine) pale yellow. Managing pain and swelling   Take a warm-water bath (sitz bath) for 20 minutes to ease pain. Do this 3-4 times a day. You may do this in a bathtub or using a portable sitz bath that fits over the toilet.  If told, put ice on the painful area. It may be helpful to use ice between your warm baths. ? Put ice in a plastic bag. ? Place a towel between your skin and the bag. ? Leave the ice on for 20 minutes, 2-3 times a day. General instructions  Take over-the-counter and prescription medicines only as told by your doctor. ? Medicated creams and medicines may be used as told.  Exercise often. Ask your doctor how much and what kind of exercise is best for you.  Go to the bathroom when you have the urge  to poop. Do not wait.  Avoid pushing too hard when you poop.  Keep your butt dry and clean. Use wet toilet paper or moist towelettes after pooping.  Do not sit on the toilet for a long time.  Keep all follow-up visits as told by your doctor. This is important. Contact a doctor if you:  Have pain and swelling that do not get better with treatment or medicine.  Have trouble pooping.  Cannot poop.  Have pain or swelling outside the area of the hemorrhoids. Get help right away if you have:  Bleeding that will not stop. Summary  Hemorrhoids are swollen veins in the butt or around the opening of the butt.  They can cause pain, itching, or bleeding.  Eat foods that have a lot of fiber in them. These include whole grains, beans, nuts, fruits, and vegetables.  Take a warm-water bath (sitz bath) for 20 minutes to ease pain. Do this 3-4 times a day. This information is not intended to replace advice given to you by your health care provider. Make sure you discuss any questions you have with your health care provider. Document Revised: 09/30/2018 Document Reviewed: 02/11/2018 Elsevier Patient Education  [place].  Gastritis,  Adult  Gastritis is swelling (inflammation) of the stomach. Gastritis can develop quickly (acute). It can also develop slowly over time (chronic). It is important to get help for this condition. If you do not get help, your stomach can bleed, and you can get sores (ulcers) in your stomach. What are the causes? This condition may be caused by:  Germs that get to your stomach.  Drinking too much alcohol.  Medicines you are taking.  Too much acid in the stomach.  A disease of the intestines or stomach.  Stress.  An allergic reaction.  Crohn's disease.  Some cancer treatments (radiation). Sometimes the cause of this condition is not known. What are the signs or symptoms? Symptoms of this condition include:  Pain in your stomach.  A burning feeling in your stomach.  Feeling sick to your stomach (nauseous).  Throwing up (vomiting).  Feeling too full after you eat.  Weight loss.  Bad breath.  Throwing up blood.  Blood in your poop (stool). How is this diagnosed? This condition may be diagnosed with:  Your medical history and symptoms.  A physical exam.  Tests. These can include: ? Blood tests. ? Stool tests. ? A procedure to look inside your stomach (upper endoscopy). ? A test in which a sample of tissue is taken for testing (biopsy). How is this treated? Treatment for this condition depends on what caused it. You may be given:  Antibiotic medicine, if your condition was caused by germs.  H2 blockers and similar medicines, if your condition was caused by too much acid. Follow these instructions at home: Medicines  Take over-the-counter and prescription medicines only as told by your doctor.  If you were prescribed an antibiotic medicine, take it as told by your doctor. Do not stop taking it even if you start to feel better. Eating and drinking   Eat small meals often, instead of large meals.  Avoid foods and drinks that make your symptoms  worse.  Drink enough fluid to keep your pee (urine) pale yellow. Alcohol use  Do not drink alcohol if: ? Your doctor tells you not to drink. ? You are pregnant, may be pregnant, or are planning to become pregnant.  If you drink alcohol: ? Limit your use to:  0-1 drink a day for women.  0-2 drinks a day for men. ? Be aware of how much alcohol is in your drink. In the [place] one drink equals one 12 oz bottle of beer (355 mL), one 5 oz glass of wine (148 mL), or one 1 oz glass of hard liquor (44 mL). General instructions  Talk with your doctor about ways to manage stress. You can exercise or do deep breathing, meditation, or yoga.  Do not smoke or use products that have nicotine or tobacco. If you need help quitting, ask your doctor.  Keep all follow-up visits as told by your doctor. This is important. Contact a doctor if:  Your symptoms get worse.  Your symptoms go away and then come back. Get help right away if:  You throw up blood or something that looks like coffee grounds.  You have black or dark red poop.  You throw up any time you try to drink fluids.  Your stomach pain gets worse.  You have a fever.  You do not feel better after one week. Summary  Gastritis is swelling (inflammation) of the stomach.  You must get help for this condition. If you do not get help, your stomach can bleed, and you can get sores (ulcers).  This condition is diagnosed with medical history, physical exam, or tests.  You can be treated with medicines for germs or medicines to block too much acid in your stomach. This information is not intended to replace advice given to you by your health care provider. Make sure you discuss any questions you have with your health care provider. Document Revised: 02/09/2018 Document Reviewed: 02/09/2018 Elsevier Patient Education  [place] [facility], Adult, Care After This sheet gives you information about how to care for  yourself after your procedure. Your health care provider may also give you more specific instructions. If you have problems or questions, contact your health care provider. What can I expect after the procedure? After the procedure, it is common to have:  A sore throat.  Mild stomach pain or discomfort.  Bloating.  Nausea. Follow these instructions at home:   Follow instructions from your health care provider about what to eat or drink after your procedure.  Return to your normal activities as told by your health care provider. Ask your health care provider what activities are safe for you.  Take over-the-counter and prescription medicines only as told by your health care provider.  Do not drive for 24 hours if you were given a sedative during your procedure.  Keep all follow-up visits as told by your health care provider. This is important. Contact a health care provider if you have:  A sore throat that lasts longer than one day.  Trouble swallowing. Get help right away if:  You vomit blood or your vomit looks like coffee grounds.  You have: ? A fever. ? Bloody, black, or tarry stools. ? A severe sore throat or you cannot swallow. ? Difficulty breathing. ? Severe pain in your chest or abdomen. Summary  After the procedure, it is common to have a sore throat, mild stomach discomfort, bloating, and nausea.  Do not drive for 24 hours if you were given a sedative during the procedure.  Follow instructions from your health care provider about what to eat or drink after your procedure.  Return to your normal activities as told by your health care provider. This information is not intended to replace advice given to  you by your health care provider. Make sure you discuss any questions you have with your health care provider. Document Revised: 03/16/2018 Document Reviewed: 02/22/2018 Elsevier Patient Education  [place].

## 2020-02-23 ENCOUNTER — Telehealth (INDEPENDENT_AMBULATORY_CARE_PROVIDER_SITE_OTHER): Payer: Self-pay | Admitting: Internal Medicine

## 2020-02-23 NOTE — Telephone Encounter (Signed)
EGD/ED sch'd 03/01/20 at 1255 (1150), covid 5/25 at 130. Left detailed message for patient

## 2020-02-23 NOTE — Telephone Encounter (Signed)
Spouse called stated patient needs his throat stretched again - please advise - ph# [phone]

## 2020-02-23 NOTE — Telephone Encounter (Signed)
Dr Laural Golden was told and he responded yeah.

## 2020-02-28 ENCOUNTER — Other Ambulatory Visit (HOSPITAL_COMMUNITY)
Admission: RE | Admit: 2020-02-28 | Discharge: 2020-02-28 | Disposition: A | Discharge status: Home / Self Care | Payer: Managed Care, Other (non HMO) | Source: Ambulatory Visit | Attending: Internal Medicine | Admitting: Internal Medicine

## 2020-02-28 ENCOUNTER — Other Ambulatory Visit: Payer: Self-pay

## 2020-02-28 DIAGNOSIS — Z20822 Contact with and (suspected) exposure to covid-19: Secondary | ICD-10-CM | POA: Diagnosis not present

## 2020-02-28 DIAGNOSIS — Z01812 Encounter for preprocedural laboratory examination: Secondary | ICD-10-CM | POA: Insufficient documentation

## 2020-02-28 LAB — SARS CORONAVIRUS 2 (TAT 6-24 HRS): SARS Coronavirus 2: NEGATIVE

## 2020-02-29 ENCOUNTER — Other Ambulatory Visit (INDEPENDENT_AMBULATORY_CARE_PROVIDER_SITE_OTHER): Payer: Self-pay | Admitting: *Deleted

## 2020-02-29 DIAGNOSIS — R1319 Other dysphagia: Secondary | ICD-10-CM

## 2020-03-01 ENCOUNTER — Other Ambulatory Visit: Payer: Self-pay

## 2020-03-01 ENCOUNTER — Ambulatory Visit (HOSPITAL_COMMUNITY)
Admission: RE | Admit: 2020-03-01 | Discharge: 2020-03-01 | Disposition: A | Discharge status: Home / Self Care | Payer: Managed Care, Other (non HMO) | Attending: Internal Medicine | Admitting: Internal Medicine

## 2020-03-01 ENCOUNTER — Encounter (HOSPITAL_COMMUNITY): Payer: Self-pay | Admitting: Internal Medicine

## 2020-03-01 ENCOUNTER — Encounter (HOSPITAL_COMMUNITY)
Admission: RE | Disposition: A | Discharge status: Home / Self Care | Payer: Self-pay | Source: Home / Self Care | Attending: Internal Medicine

## 2020-03-01 DIAGNOSIS — Z803 Family history of malignant neoplasm of breast: Secondary | ICD-10-CM | POA: Insufficient documentation

## 2020-03-01 DIAGNOSIS — K200 Eosinophilic esophagitis: Secondary | ICD-10-CM | POA: Insufficient documentation

## 2020-03-01 DIAGNOSIS — K317 Polyp of stomach and duodenum: Secondary | ICD-10-CM | POA: Diagnosis not present

## 2020-03-01 DIAGNOSIS — I129 Hypertensive chronic kidney disease with stage 1 through stage 4 chronic kidney disease, or unspecified chronic kidney disease: Secondary | ICD-10-CM | POA: Diagnosis not present

## 2020-03-01 DIAGNOSIS — K220 Achalasia of cardia: Secondary | ICD-10-CM

## 2020-03-01 DIAGNOSIS — K222 Esophageal obstruction: Secondary | ICD-10-CM | POA: Insufficient documentation

## 2020-03-01 DIAGNOSIS — K219 Gastro-esophageal reflux disease without esophagitis: Secondary | ICD-10-CM | POA: Insufficient documentation

## 2020-03-01 DIAGNOSIS — Z09 Encounter for follow-up examination after completed treatment for conditions other than malignant neoplasm: Secondary | ICD-10-CM | POA: Diagnosis not present

## 2020-03-01 DIAGNOSIS — D175 Benign lipomatous neoplasm of intra-abdominal organs: Secondary | ICD-10-CM | POA: Diagnosis not present

## 2020-03-01 DIAGNOSIS — R1314 Dysphagia, pharyngoesophageal phase: Secondary | ICD-10-CM | POA: Diagnosis present

## 2020-03-01 DIAGNOSIS — F17210 Nicotine dependence, cigarettes, uncomplicated: Secondary | ICD-10-CM | POA: Diagnosis not present

## 2020-03-01 DIAGNOSIS — K228 Other specified diseases of esophagus: Secondary | ICD-10-CM | POA: Insufficient documentation

## 2020-03-01 DIAGNOSIS — K3189 Other diseases of stomach and duodenum: Secondary | ICD-10-CM

## 2020-03-01 DIAGNOSIS — Z7982 Long term (current) use of aspirin: Secondary | ICD-10-CM | POA: Insufficient documentation

## 2020-03-01 DIAGNOSIS — Z79899 Other long term (current) drug therapy: Secondary | ICD-10-CM | POA: Diagnosis not present

## 2020-03-01 DIAGNOSIS — N189 Chronic kidney disease, unspecified: Secondary | ICD-10-CM | POA: Diagnosis not present

## 2020-03-01 DIAGNOSIS — R1319 Other dysphagia: Secondary | ICD-10-CM

## 2020-03-01 HISTORY — PX: ESOPHAGEAL DILATION: SHX303

## 2020-03-01 HISTORY — PX: ESOPHAGOGASTRODUODENOSCOPY: SHX5428

## 2020-03-01 HISTORY — PX: BIOPSY: SHX5522

## 2020-03-01 SURGERY — EGD (ESOPHAGOGASTRODUODENOSCOPY)
Anesthesia: Moderate Sedation

## 2020-03-01 MED ORDER — LIDOCAINE VISCOUS HCL 2 % MT SOLN
OROMUCOSAL | Status: DC | PRN
  Administered 2020-03-01: 4 mL via OROMUCOSAL

## 2020-03-01 MED ORDER — LIDOCAINE VISCOUS HCL 2 % MT SOLN
OROMUCOSAL | Status: AC
  Filled 2020-03-01: qty 15

## 2020-03-01 MED ORDER — SODIUM CHLORIDE 0.9 % IV SOLN: INTRAVENOUS | Status: DC

## 2020-03-01 MED ORDER — MIDAZOLAM HCL 5 MG/5ML IJ SOLN
INTRAMUSCULAR | Status: AC
  Filled 2020-03-01: qty 10

## 2020-03-01 MED ORDER — MIDAZOLAM HCL 5 MG/5ML IJ SOLN
INTRAMUSCULAR | Status: DC | PRN
  Administered 2020-03-01 (×2): 2 mg via INTRAVENOUS
  Administered 2020-03-01 (×2): 1 mg via INTRAVENOUS

## 2020-03-01 MED ORDER — MEPERIDINE HCL 50 MG/ML IJ SOLN
INTRAMUSCULAR | Status: DC | PRN
  Administered 2020-03-01 (×2): 25 mg via INTRAVENOUS

## 2020-03-01 MED ORDER — MEPERIDINE HCL 50 MG/ML IJ SOLN
INTRAMUSCULAR | Status: AC
  Filled 2020-03-01: qty 1

## 2020-03-01 NOTE — Op Note (Signed)
[facility] Patient Name: Phillip Kirk Procedure Date: 03/01/2020 12:03 PM MRN: [id number] Date of Birth: [birth year] Attending MD: Hildred Laser , MD CSN: [id number] Age: 69 Admit Type: Outpatient Procedure:                Upper GI endoscopy Indications:              Esophageal dysphagia, Follow-up of candida and                            eosinophilic esophagitis, For therapy of                            eosinophilic esophagitis Providers:                Hildred Laser, MD, Rosina Lowenstein, RN, Aram Candela Referring MD:             Grace Bushy. Luciana Axe , PA Medicines:                Lidocaine spray, Meperidine 50 mg IV, Midazolam 6                            mg IV Complications:            No immediate complications. Estimated Blood Loss:     Estimated blood loss was minimal. Procedure:                Pre-Anesthesia Assessment:                           - Prior to the procedure, a History and Physical                            was performed, and patient medications and                            allergies were reviewed. The patient's tolerance of                            previous anesthesia was also reviewed. The risks                            and benefits of the procedure and the sedation                            options and risks were discussed with the patient.                            All questions were answered, and informed consent                            was obtained. Prior Anticoagulants: The patient has                            taken no previous anticoagulant or antiplatelet  agents. ASA Grade Assessment: II - A patient with                            mild systemic disease. After reviewing the risks                            and benefits, the patient was deemed in                            satisfactory condition to undergo the procedure.                           After obtaining informed consent, the endoscope was         passed under direct vision. Throughout the                            procedure, the patient's blood pressure, pulse, and                            oxygen saturations were monitored continuously. The                            [id number] ID:[id number]) scope was introduced through the                            mouth, and advanced to the second part of duodenum.                            The upper GI endoscopy was accomplished without                            difficulty. The patient tolerated the procedure                            well. Scope In: 1:37:07 PM Scope Out: 1:48:40 PM Total Procedure Duration: 0 hours 11 minutes 33 seconds  Findings:      The hypopharynx was normal.      Diffuse mild mucosal changes characterized by discoloration, altered       texture and tight circumferential folds were found in the mid esophagus       and in the distal esophagus. Biopsies were taken with a cold forceps for       histology.      One benign-appearing, intrinsic mild stenosis was found 38 to 39 cm from       the incisors. The stenosis was traversed. A TTS dilator was passed       through the scope. Dilation with a 15-16.5-18 mm balloon dilator was       performed to 15 mm, 16.5 mm and 18 mm. The dilation site was examined       and showed mild mucosal disruption, moderate improvement in luminal       narrowing and no perforation.      The Z-line was irregular and was found 39 cm from the incisors.      A few erosions with no stigmata of recent bleeding  were found in the       prepyloric region of the stomach.      A single small sessile polyp with no stigmata of recent bleeding was       found in the prepyloric region of the stomach.      The exam of the stomach was otherwise normal.      The duodenal bulb was normal.      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 Impression:               - Normal hypopharynx.                           - Discolored, texture  changed, tight                            circumferentially folded mucosa in the esophagus.                            Biopsied.                           - Benign-appearing distal esophageal stenosis.                            Dilated.                           - Z-line irregular, 39 cm from the incisors.                           - Erosive gastropathy with no stigmata of recent                            bleeding.                           - A single gastric polyp.It was left alone.                            appeared to be hyperplastic polyp.                           - Normal duodenal bulb.                           - Duodenal lipoma. Moderate Sedation:      Moderate (conscious) sedation was administered by the endoscopy nurse       and supervised by the endoscopist. The following parameters were       monitored: oxygen saturation, heart rate, blood pressure, CO2       capnography and response to care. Total physician intraservice time was       16 minutes. Recommendation:           - Patient has a contact number available for                            emergencies. The signs and symptoms of potential  delayed complications were discussed with the                            patient. Return to normal activities tomorrow.                            Written discharge instructions were provided to the                            patient.                           - Resume previous diet today.                           - Continue present medications.                           - No aspirin, ibuprofen, naproxen, or other                            non-steroidal anti-inflammatory drugs for 3 days.                           - Await pathology results. Procedure Code(s):        --- Professional ---                           [phone], Esophagogastroduodenoscopy, flexible,                            transoral; with transendoscopic balloon dilation of                             esophagus (less than 30 mm diameter)                           G0500, Moderate sedation services provided by the                            same physician or other qualified health care                            professional performing a gastrointestinal                            endoscopic service that sedation supports,                            requiring the presence of an independent trained                            observer to assist in the monitoring of the                            patient's level of consciousness and physiological  status; initial 15 minutes of intra-service time;                            patient age 42 years or older (additional time may                            be reported with [phone], as appropriate) Diagnosis Code(s):        --- Professional ---                           K22.8, Other specified diseases of esophagus                           K22.2, Esophageal obstruction                           K31.89, Other diseases of stomach and duodenum                           K31.7, Polyp of stomach and duodenum                           D17.5, Benign lipomatous neoplasm of                            intra-abdominal organs                           R13.14, Dysphagia, pharyngoesophageal phase                           [id number], Eosinophilic esophagitis CPT copyright 2019 American Medical Association. All rights reserved. The codes documented in this report are preliminary and upon coder review may  be revised to meet current compliance requirements. Hildred Laser, MD Hildred Laser, MD 03/01/2020 2:11:07 PM This report has been signed electronically. Number of Addenda: 0

## 2020-03-01 NOTE — Discharge Instructions (Signed)
Upper Endoscopy, Adult, Care After This sheet gives you information about how to care for yourself after your procedure. Your health care provider may also give you more specific instructions. If you have problems or questions, contact your health care provider. What can I expect after the procedure? After the procedure, it is common to have:  A sore throat.  Mild stomach pain or discomfort.  Bloating.  Nausea. Follow these instructions at home:   Follow instructions from your health care provider about what to eat or drink after your procedure.  Return to your normal activities as told by your health care provider. Ask your health care provider what activities are safe for you.  Take over-the-counter and prescription medicines only as told by your health care provider.  Do not drive for 24 hours if you were given a sedative during your procedure.  Keep all follow-up visits as told by your health care provider. This is important. Contact a health care provider if you have:  A sore throat that lasts longer than one day.  Trouble swallowing. Get help right away if:  You vomit blood or your vomit looks like coffee grounds.  You have: ? A fever. ? Bloody, black, or tarry stools. ? A severe sore throat or you cannot swallow. ? Difficulty breathing. ? Severe pain in your chest or abdomen. Summary  After the procedure, it is common to have a sore throat, mild stomach discomfort, bloating, and nausea.  Do not drive for 24 hours if you were given a sedative during the procedure.  Follow instructions from your health care provider about what to eat or drink after your procedure.  Return to your normal activities as told by your health care provider. This information is not intended to replace advice given to you by your health care provider. Make sure you discuss any questions you have with your health care provider. Document Revised: 03/16/2018 Document Reviewed:  02/22/2018 Elsevier Patient Education  [place]. No aspirin or NSAIDs for 3 days. Resume other medications as before. Resume usual diet. Please remember to chew food thoroughly and eat slowly. No driving for 24 hours. Physician will call with biopsy results.

## 2020-03-01 NOTE — H&P (Signed)
Phillip Kirk is an 69 y.o. male.   Chief Complaint: Patient is here for esophagogastroduodenoscopy and esophageal dilation. HPI: Patient is 69 year old Caucasian male who has chronic GERD as well as eosinophilic esophagitis with esophageal stricture which has been dilated in the past. He underwent EGD for dysphagia and weight loss on 02/15/2020. He was noted to have Candida esophagitis and high-grade distal esophageal stricture was only dilated to 13.5 mm. He was treated with Diflucan. He is now returning for repeat evaluation and dilation. He says he has been very careful with solids but he has not had any episode of food impaction. He does not feel he has lost any more weight.  Past Medical History:  Diagnosis Date  . Chronic kidney disease   . Dysphagia 07/24/2017  . Essential hypertension, benign 07/24/2017  . GERD (gastroesophageal reflux disease)     Past Surgical History:  Procedure Laterality Date  . BIOPSY  07/24/2017   Procedure: BIOPSY;  Surgeon: Rogene Houston, MD;  [facility];  Service: Endoscopy;;  gastric esophagus  . CATARACT EXTRACTION    . CATARACT EXTRACTION Left   . COLONOSCOPY N/A 02/15/2020   Procedure: COLONOSCOPY;  Surgeon: Rogene Houston, MD;  [facility];  Service: Endoscopy;  Laterality: N/A;  940  . ESOPHAGEAL BRUSHING  02/15/2020   Procedure: ESOPHAGEAL BRUSHING;  Surgeon: Rogene Houston, MD;  [facility];  Service: Endoscopy;;  . ESOPHAGEAL DILATION N/A 07/24/2017   Procedure: ESOPHAGEAL DILATION;  Surgeon: Rogene Houston, MD;  [facility];  Service: Endoscopy;  Laterality: N/A;  . ESOPHAGEAL DILATION N/A 11/19/2017   Procedure: ESOPHAGEAL DILATION;  Surgeon: Rogene Houston, MD;  [facility];  Service: Endoscopy;  Laterality: N/A;  . ESOPHAGEAL DILATION N/A 02/15/2020   Procedure: ESOPHAGEAL DILATION;  Surgeon: Rogene Houston, MD;  [facility];  Service: Endoscopy;  Laterality:  N/A;  . ESOPHAGOGASTRODUODENOSCOPY N/A 07/24/2017   Procedure: ESOPHAGOGASTRODUODENOSCOPY (EGD);  Surgeon: Rogene Houston, MD;  [facility];  Service: Endoscopy;  Laterality: N/A;  . ESOPHAGOGASTRODUODENOSCOPY N/A 11/19/2017   Procedure: ESOPHAGOGASTRODUODENOSCOPY (EGD);  Surgeon: Rogene Houston, MD;  [facility];  Service: Endoscopy;  Laterality: N/A;  . ESOPHAGOGASTRODUODENOSCOPY N/A 02/15/2020   Procedure: ESOPHAGOGASTRODUODENOSCOPY (EGD);  Surgeon: Rogene Houston, MD;  [facility];  Service: Endoscopy;  Laterality: N/A;  . ROTATOR CUFF REPAIR Left   . rt eye surgery     trauma    Family History  Problem Relation Age of Onset  . Alzheimer's disease Sister   . Breast cancer Sister   . Breast cancer Sister    Social History:  reports that he quit smoking about 27 years ago. His smoking use included cigarettes. He has a 28.00 pack-year smoking history. His smokeless tobacco use includes chew. He reports current alcohol use. He reports that he does not use drugs.  Allergies: No Known Allergies  Medications Prior to Admission  Medication Sig Dispense Refill  . acetaminophen (TYLENOL) 500 MG tablet Take 500-1,000 mg by mouth every 6 (six) hours as needed for moderate pain or headache.     [name] amLODipine (NORVASC) 5 MG tablet Take 5 mg by mouth daily.     [name] aspirin EC 81 MG tablet Take 1 tablet (81 mg total) by mouth daily.    . metoprolol tartrate (LOPRESSOR) 25 MG tablet Take 12.5 mg by mouth in the morning and at bedtime.    [name]  montelukast (SINGULAIR) 10 MG tablet Take 1 tablet (10 mg total) by mouth daily. 90 tablet 3  . omeprazole (PRILOSEC) 40 MG capsule Take 40 mg by mouth daily.    . tamsulosin (FLOMAX) 0.4 MG CAPS capsule Take 0.4 mg by mouth daily after breakfast.    . triamcinolone cream (KENALOG) 0.1 % Apply 1 application topically 3 (three) times daily as needed (psoriasis).     [name] zolpidem (AMBIEN) 5 MG tablet Take 5 mg by mouth daily.        No results found for this or any previous visit (from the past 48 hour(s)). No results found.  Review of Systems  There were no vitals taken for this visit. Physical Exam  Constitutional: He appears well-developed and well-nourished.  HENT:  Mouth/Throat: Oropharynx is clear and moist.  Patient has dentures.  Eyes: Conjunctivae are normal. No scleral icterus.  Neck: No thyromegaly present.  Cardiovascular: Normal rate, regular rhythm and normal heart sounds.  No murmur heard. Respiratory: Effort normal and breath sounds normal.  GI: Soft. He exhibits no distension and no mass. There is no abdominal tenderness.  Musculoskeletal:        General: No edema.  Lymphadenopathy:    He has no cervical adenopathy.  Neurological: He is alert.  Skin: Skin is warm and dry.     Assessment/Plan Eosinophilic esophagitis with distal esophageal stricture. History of Candida esophagitis Esophagogastroduodenoscopy with esophageal dilation.  Hildred Laser, MD 03/01/2020, 1:56 PM

## 2020-03-02 LAB — SURGICAL PATHOLOGY

## 2020-04-05 ENCOUNTER — Ambulatory Visit (INDEPENDENT_AMBULATORY_CARE_PROVIDER_SITE_OTHER): Payer: Managed Care, Other (non HMO) | Admitting: Gastroenterology

## 2020-06-01 ENCOUNTER — Encounter (HOSPITAL_COMMUNITY): Payer: Self-pay

## 2020-06-01 ENCOUNTER — Other Ambulatory Visit: Payer: Self-pay

## 2020-06-01 ENCOUNTER — Inpatient Hospital Stay (HOSPITAL_COMMUNITY)
Admission: EM | Admit: 2020-06-01 | Discharge: 2020-06-08 | DRG: 871 | Disposition: A | Discharge status: Home / Self Care | Payer: Medicare Other | Attending: Family Medicine | Admitting: Family Medicine

## 2020-06-01 ENCOUNTER — Emergency Department (HOSPITAL_COMMUNITY): Payer: Medicare Other

## 2020-06-01 DIAGNOSIS — R778 Other specified abnormalities of plasma proteins: Secondary | ICD-10-CM

## 2020-06-01 DIAGNOSIS — N179 Acute kidney failure, unspecified: Secondary | ICD-10-CM | POA: Diagnosis present

## 2020-06-01 DIAGNOSIS — I248 Other forms of acute ischemic heart disease: Secondary | ICD-10-CM | POA: Insufficient documentation

## 2020-06-01 DIAGNOSIS — I21A1 Myocardial infarction type 2: Secondary | ICD-10-CM | POA: Diagnosis present

## 2020-06-01 DIAGNOSIS — I129 Hypertensive chronic kidney disease with stage 1 through stage 4 chronic kidney disease, or unspecified chronic kidney disease: Secondary | ICD-10-CM | POA: Diagnosis present

## 2020-06-01 DIAGNOSIS — Z720 Tobacco use: Secondary | ICD-10-CM | POA: Diagnosis not present

## 2020-06-01 DIAGNOSIS — R8271 Bacteriuria: Secondary | ICD-10-CM | POA: Diagnosis present

## 2020-06-01 DIAGNOSIS — E1165 Type 2 diabetes mellitus with hyperglycemia: Secondary | ICD-10-CM | POA: Diagnosis present

## 2020-06-01 DIAGNOSIS — Z9842 Cataract extraction status, left eye: Secondary | ICD-10-CM | POA: Diagnosis not present

## 2020-06-01 DIAGNOSIS — B9689 Other specified bacterial agents as the cause of diseases classified elsewhere: Secondary | ICD-10-CM | POA: Diagnosis present

## 2020-06-01 DIAGNOSIS — B952 Enterococcus as the cause of diseases classified elsewhere: Secondary | ICD-10-CM | POA: Diagnosis present

## 2020-06-01 DIAGNOSIS — K5900 Constipation, unspecified: Secondary | ICD-10-CM | POA: Diagnosis not present

## 2020-06-01 DIAGNOSIS — R140 Abdominal distension (gaseous): Secondary | ICD-10-CM

## 2020-06-01 DIAGNOSIS — R6520 Severe sepsis without septic shock: Secondary | ICD-10-CM | POA: Diagnosis present

## 2020-06-01 DIAGNOSIS — I214 Non-ST elevation (NSTEMI) myocardial infarction: Secondary | ICD-10-CM

## 2020-06-01 DIAGNOSIS — K219 Gastro-esophageal reflux disease without esophagitis: Secondary | ICD-10-CM | POA: Diagnosis present

## 2020-06-01 DIAGNOSIS — Z87440 Personal history of urinary (tract) infections: Secondary | ICD-10-CM

## 2020-06-01 DIAGNOSIS — A409 Streptococcal sepsis, unspecified: Principal | ICD-10-CM | POA: Diagnosis present

## 2020-06-01 DIAGNOSIS — Z20822 Contact with and (suspected) exposure to covid-19: Secondary | ICD-10-CM | POA: Diagnosis present

## 2020-06-01 DIAGNOSIS — E785 Hyperlipidemia, unspecified: Secondary | ICD-10-CM | POA: Diagnosis present

## 2020-06-01 DIAGNOSIS — Z820 Family history of epilepsy and other diseases of the nervous system: Secondary | ICD-10-CM | POA: Diagnosis not present

## 2020-06-01 DIAGNOSIS — K810 Acute cholecystitis: Secondary | ICD-10-CM

## 2020-06-01 DIAGNOSIS — K8000 Calculus of gallbladder with acute cholecystitis without obstruction: Secondary | ICD-10-CM | POA: Diagnosis present

## 2020-06-01 DIAGNOSIS — E1122 Type 2 diabetes mellitus with diabetic chronic kidney disease: Secondary | ICD-10-CM | POA: Diagnosis present

## 2020-06-01 DIAGNOSIS — I10 Essential (primary) hypertension: Secondary | ICD-10-CM | POA: Diagnosis present

## 2020-06-01 DIAGNOSIS — N189 Chronic kidney disease, unspecified: Secondary | ICD-10-CM | POA: Diagnosis present

## 2020-06-01 DIAGNOSIS — Z79899 Other long term (current) drug therapy: Secondary | ICD-10-CM | POA: Diagnosis not present

## 2020-06-01 DIAGNOSIS — N400 Enlarged prostate without lower urinary tract symptoms: Secondary | ICD-10-CM | POA: Diagnosis present

## 2020-06-01 DIAGNOSIS — Z7982 Long term (current) use of aspirin: Secondary | ICD-10-CM | POA: Diagnosis not present

## 2020-06-01 DIAGNOSIS — A419 Sepsis, unspecified organism: Secondary | ICD-10-CM

## 2020-06-01 HISTORY — DX: Alcohol abuse, uncomplicated: F10.10

## 2020-06-01 HISTORY — DX: Abnormal result of other cardiovascular function study: R94.39

## 2020-06-01 HISTORY — DX: Other forms of acute ischemic heart disease: I24.8

## 2020-06-01 HISTORY — DX: Personal history of other specified conditions: Z87.898

## 2020-06-01 HISTORY — DX: Urinary tract infection, site not specified: N39.0

## 2020-06-01 HISTORY — DX: Other forms of acute ischemic heart disease: I24.89

## 2020-06-01 HISTORY — DX: Personal history of nicotine dependence: Z87.891

## 2020-06-01 HISTORY — DX: Benign prostatic hyperplasia without lower urinary tract symptoms: N40.0

## 2020-06-01 LAB — TROPONIN I (HIGH SENSITIVITY)
Troponin I (High Sensitivity): 256 ng/L (ref <18)
Troponin I (High Sensitivity): 244 ng/L (ref <18)

## 2020-06-01 LAB — CBC
HCT: 48 % (ref 39.0–52.0)
Hemoglobin: 15.8 g/dL (ref 13.0–17.0)
MCH: 29.4 pg (ref 26.0–34.0)
MCHC: 32.9 g/dL (ref 30.0–36.0)
MCV: 89.2 fL (ref 80.0–100.0)
Platelets: 258 10*3/uL (ref 150–400)
RBC: 5.38 MIL/uL (ref 4.22–5.81)
RDW: 13 % (ref 11.5–15.5)
WBC: 15.6 10*3/uL — ABNORMAL HIGH (ref 4.0–10.5)
nRBC: 0 % (ref 0.0–0.2)

## 2020-06-01 LAB — APTT: aPTT: 27 seconds (ref 24–36)

## 2020-06-01 LAB — BASIC METABOLIC PANEL
Anion gap: 12 (ref 5–15)
BUN: 16 mg/dL (ref 8–23)
CO2: 26 mmol/L (ref 22–32)
Calcium: 9.4 mg/dL (ref 8.9–10.3)
Chloride: 100 mmol/L (ref 98–111)
Creatinine, Ser: 0.95 mg/dL (ref 0.61–1.24)
GFR calc Af Amer: >60 mL/min (ref >60)
GFR calc non Af Amer: >60 mL/min (ref >60)
Glucose, Bld: 170 mg/dL — ABNORMAL HIGH (ref 70–99)
Potassium: 3.8 mmol/L (ref 3.5–5.1)
Sodium: 138 mmol/L (ref 135–145)

## 2020-06-01 LAB — PROTIME-INR
INR: 1 (ref 0.8–1.2)
Prothrombin Time: 12.4 seconds (ref 11.4–15.2)

## 2020-06-01 LAB — SARS CORONAVIRUS 2 BY RT PCR (HOSPITAL ORDER, PERFORMED IN ~~LOC~~ HOSPITAL LAB): SARS Coronavirus 2: NEGATIVE

## 2020-06-01 MED ORDER — TAMSULOSIN HCL 0.4 MG PO CAPS
0.4000 mg | ORAL_CAPSULE | Freq: Every day | ORAL | Status: DC
  Administered 2020-06-02 – 2020-06-08 (×7): 0.4 mg via ORAL
  Filled 2020-06-01 (×7): qty 1

## 2020-06-01 MED ORDER — ZOLPIDEM TARTRATE 5 MG PO TABS
5.0000 mg | ORAL_TABLET | Freq: Every day | ORAL | Status: DC
  Administered 2020-06-02 – 2020-06-07 (×7): 5 mg via ORAL
  Filled 2020-06-01 (×7): qty 1

## 2020-06-01 MED ORDER — AMLODIPINE BESYLATE 5 MG PO TABS
5.0000 mg | ORAL_TABLET | Freq: Every day | ORAL | Status: DC
  Administered 2020-06-02 – 2020-06-08 (×7): 5 mg via ORAL
  Filled 2020-06-01 (×7): qty 1

## 2020-06-01 MED ORDER — SODIUM CHLORIDE 0.9% FLUSH: 3.0000 mL | INTRAVENOUS | Status: DC | PRN

## 2020-06-01 MED ORDER — ASPIRIN 81 MG PO CHEW
324.0000 mg | CHEWABLE_TABLET | Freq: Once | ORAL | Status: AC
  Administered 2020-06-01: 324 mg via ORAL

## 2020-06-01 MED ORDER — NITROGLYCERIN IN D5W 200-5 MCG/ML-% IV SOLN
0.0000 ug/min | INTRAVENOUS | Status: DC
  Administered 2020-06-01: 5 ug/min via INTRAVENOUS
  Filled 2020-06-01: qty 250

## 2020-06-01 MED ORDER — POLYETHYLENE GLYCOL 3350 17 G PO PACK
17.0000 g | PACK | Freq: Every day | ORAL | Status: DC | PRN
  Administered 2020-06-03: 17 g via ORAL
  Filled 2020-06-01: qty 1

## 2020-06-01 MED ORDER — SODIUM CHLORIDE 0.9% FLUSH
3.0000 mL | Freq: Two times a day (BID) | INTRAVENOUS | Status: DC
  Administered 2020-06-02 – 2020-06-08 (×8): 3 mL via INTRAVENOUS

## 2020-06-01 MED ORDER — HEPARIN (PORCINE) 25000 UT/250ML-% IV SOLN
1700.0000 [IU]/h | INTRAVENOUS | Status: DC
  Administered 2020-06-01 – 2020-06-02 (×2): 1200 [IU]/h via INTRAVENOUS
  Administered 2020-06-03: 1500 [IU]/h via INTRAVENOUS
  Administered 2020-06-04: 1700 [IU]/h via INTRAVENOUS
  Filled 2020-06-01 (×4): qty 250

## 2020-06-01 MED ORDER — ONDANSETRON HCL 4 MG PO TABS: 4.0000 mg | ORAL_TABLET | Freq: Four times a day (QID) | ORAL | Status: DC | PRN

## 2020-06-01 MED ORDER — METOPROLOL TARTRATE 12.5 MG HALF TABLET
12.5000 mg | ORAL_TABLET | Freq: Two times a day (BID) | ORAL | Status: DC
  Administered 2020-06-02 – 2020-06-08 (×14): 12.5 mg via ORAL
  Filled 2020-06-01 (×14): qty 1

## 2020-06-01 MED ORDER — HEPARIN (PORCINE) 25000 UT/250ML-% IV SOLN: 1000.0000 [IU]/h | INTRAVENOUS | Status: DC

## 2020-06-01 MED ORDER — ONDANSETRON HCL 4 MG/2ML IJ SOLN
4.0000 mg | Freq: Four times a day (QID) | INTRAMUSCULAR | Status: DC | PRN
  Administered 2020-06-02: 4 mg via INTRAVENOUS
  Filled 2020-06-01: qty 2

## 2020-06-01 MED ORDER — ASPIRIN 81 MG PO CHEW
CHEWABLE_TABLET | ORAL | Status: AC
  Filled 2020-06-01: qty 4

## 2020-06-01 MED ORDER — NITROGLYCERIN 2 % TD OINT
1.0000 [in_us] | TOPICAL_OINTMENT | Freq: Once | TRANSDERMAL | Status: AC
  Administered 2020-06-01: 1 [in_us] via TOPICAL
  Filled 2020-06-01: qty 1

## 2020-06-01 MED ORDER — BISACODYL 10 MG RE SUPP: 10.0000 mg | Freq: Every day | RECTAL | Status: DC | PRN

## 2020-06-01 MED ORDER — MONTELUKAST SODIUM 10 MG PO TABS
10.0000 mg | ORAL_TABLET | Freq: Every day | ORAL | Status: DC
  Administered 2020-06-02 – 2020-06-08 (×7): 10 mg via ORAL
  Filled 2020-06-01 (×7): qty 1

## 2020-06-01 MED ORDER — ATORVASTATIN CALCIUM 80 MG PO TABS
80.0000 mg | ORAL_TABLET | Freq: Every day | ORAL | Status: DC
  Administered 2020-06-02 – 2020-06-08 (×8): 80 mg via ORAL
  Filled 2020-06-01 (×5): qty 1
  Filled 2020-06-01: qty 2
  Filled 2020-06-01 (×2): qty 1

## 2020-06-01 MED ORDER — MORPHINE SULFATE (PF) 2 MG/ML IV SOLN
2.0000 mg | Freq: Once | INTRAVENOUS | Status: AC
  Administered 2020-06-01: 2 mg via INTRAVENOUS
  Filled 2020-06-01: qty 1

## 2020-06-01 MED ORDER — PANTOPRAZOLE SODIUM 40 MG PO TBEC
40.0000 mg | DELAYED_RELEASE_TABLET | Freq: Every day | ORAL | Status: DC
  Administered 2020-06-02 – 2020-06-08 (×7): 40 mg via ORAL
  Filled 2020-06-01 (×7): qty 1

## 2020-06-01 MED ORDER — HEPARIN BOLUS VIA INFUSION
4000.0000 [IU] | Freq: Once | INTRAVENOUS | Status: AC
  Administered 2020-06-01: 4000 [IU] via INTRAVENOUS

## 2020-06-01 MED ORDER — MORPHINE SULFATE (PF) 4 MG/ML IV SOLN
4.0000 mg | Freq: Once | INTRAVENOUS | Status: AC
  Administered 2020-06-01: 4 mg via INTRAVENOUS
  Filled 2020-06-01: qty 1

## 2020-06-01 MED ORDER — SODIUM CHLORIDE 0.9 % IV SOLN: 250.0000 mL | INTRAVENOUS | Status: DC | PRN

## 2020-06-01 MED ORDER — ASPIRIN EC 81 MG PO TBEC: 81.0000 mg | DELAYED_RELEASE_TABLET | Freq: Every day | ORAL | Status: DC

## 2020-06-01 MED ORDER — ASPIRIN EC 81 MG PO TBEC
81.0000 mg | DELAYED_RELEASE_TABLET | Freq: Every day | ORAL | Status: DC
  Administered 2020-06-02 – 2020-06-08 (×7): 81 mg via ORAL
  Filled 2020-06-01 (×7): qty 1

## 2020-06-01 NOTE — Progress Notes (Signed)
ANTICOAGULATION CONSULT NOTE - Initial Consult  Pharmacy Consult for Heparin Indication: chest pain/ACS  No Known Allergies  Patient Measurements: Height: 5\' 9"  (175.3 cm) Weight: 77.1 kg (170 lb) IBW/kg (Calculated) : 70.7 Heparin Dosing Weight: HEPARIN DW (KG): 77.1  Vital Signs: Temp: 98.1 F (36.7 C) (08/27 1933) Temp Source: Oral (08/27 1933) BP: 162/73 (08/27 1933) Pulse Rate: 77 (08/27 1933)  Labs: Recent Labs    06/01/20 1950  HGB 15.8  HCT 48.0  PLT 258  CREATININE 0.95  TROPONINIHS 256*    Estimated Creatinine Clearance: 73.4 mL/min (by C-G formula based on SCr of 0.95 mg/dL).   Medical History: Past Medical History:  Diagnosis Date  . Chronic kidney disease   . Dysphagia 07/24/2017  . Essential hypertension, benign 07/24/2017  . GERD (gastroesophageal reflux disease)     Medications:  (Not in a hospital admission)   Assessment: 69 yo M presented to ED with CP.  To start heparin.  No blood thinners PTA noted.  Goal of Therapy:  Heparin level 0.3-0.7 units/ml Monitor platelets by anticoagulation protocol: Yes   Plan:  Baseline PT/PTT Heparin 4000 unit bolus (per ED MD) Heparin infusion at 1200 units/hr Heparin level in 6 hours Heparin level and CBC daily   [employer], Pharm.D., BCPS Clinical Pharmacist  **Pharmacist phone directory can be found on [email or web address] listed under [place].  06/01/2020 9:52 PM

## 2020-06-01 NOTE — ED Provider Notes (Signed)
[facility] EMERGENCY DEPARTMENT Provider Note   CSN: [id number] Arrival date & time: 06/01/20  6546     History Chief Complaint  Patient presents with   Chest Pain    Phillip Kirk is a 69 y.o. male.  Patient complains of chest pain with shortness of breath and sweating.  This started around 3:00 this afternoon.  Patient feeling much better now and states his pain is only a 2  The history is provided by the patient and medical records. No language interpreter was used.  Chest Pain Pain location:  Substernal area Pain quality: aching   Pain severity:  Moderate Onset quality:  Sudden Timing:  Constant Progression:  Worsening Relieved by:  None tried Worsened by:  Nothing Associated symptoms: no abdominal pain, no back pain, no cough, no fatigue and no headache        Past Medical History:  Diagnosis Date   Chronic kidney disease    Dysphagia 07/24/2017   Essential hypertension, benign 07/24/2017   GERD (gastroesophageal reflux disease)     Patient Active Problem List   Diagnosis Date Noted   Elevated serum creatinine 12/27/2019   Acute renal failure ([place]) 08/16/2019   GERD (gastroesophageal reflux disease) 08/16/2019   BPH (benign prostatic hyperplasia) 08/16/2019   Esophageal stricture 08/10/2017   Essential hypertension, benign 07/24/2017   Dysphagia 07/24/2017   Esophageal dysphagia 07/24/2017    Past Surgical History:  Procedure Laterality Date   BIOPSY  07/24/2017   Procedure: BIOPSY;  Surgeon: Rogene Houston, MD;  [facility];  Service: Endoscopy;;  gastric esophagus   BIOPSY  03/01/2020   Procedure: BIOPSY;  Surgeon: Rogene Houston, MD;  [facility];  Service: Endoscopy;;   CATARACT EXTRACTION     CATARACT EXTRACTION Left    COLONOSCOPY N/A 02/15/2020   Procedure: COLONOSCOPY;  Surgeon: Rogene Houston, MD;  [facility];  Service: Endoscopy;  Laterality: N/A;  940   ESOPHAGEAL BRUSHING   02/15/2020   Procedure: ESOPHAGEAL BRUSHING;  Surgeon: Rogene Houston, MD;  [facility];  Service: Endoscopy;;   ESOPHAGEAL DILATION N/A 07/24/2017   Procedure: ESOPHAGEAL DILATION;  Surgeon: Rogene Houston, MD;  [facility];  Service: Endoscopy;  Laterality: N/A;   ESOPHAGEAL DILATION N/A 11/19/2017   Procedure: ESOPHAGEAL DILATION;  Surgeon: Rogene Houston, MD;  [facility];  Service: Endoscopy;  Laterality: N/A;   ESOPHAGEAL DILATION N/A 02/15/2020   Procedure: ESOPHAGEAL DILATION;  Surgeon: Rogene Houston, MD;  [facility];  Service: Endoscopy;  Laterality: N/A;   ESOPHAGEAL DILATION N/A 03/01/2020   Procedure: ESOPHAGEAL DILATION;  Surgeon: Rogene Houston, MD;  [facility];  Service: Endoscopy;  Laterality: N/A;   ESOPHAGOGASTRODUODENOSCOPY N/A 07/24/2017   Procedure: ESOPHAGOGASTRODUODENOSCOPY (EGD);  Surgeon: Rogene Houston, MD;  [facility];  Service: Endoscopy;  Laterality: N/A;   ESOPHAGOGASTRODUODENOSCOPY N/A 11/19/2017   Procedure: ESOPHAGOGASTRODUODENOSCOPY (EGD);  Surgeon: Rogene Houston, MD;  [facility];  Service: Endoscopy;  Laterality: N/A;   ESOPHAGOGASTRODUODENOSCOPY N/A 02/15/2020   Procedure: ESOPHAGOGASTRODUODENOSCOPY (EGD);  Surgeon: Rogene Houston, MD;  [facility];  Service: Endoscopy;  Laterality: N/A;   ESOPHAGOGASTRODUODENOSCOPY N/A 03/01/2020   Procedure: ESOPHAGOGASTRODUODENOSCOPY (EGD);  Surgeon: Rogene Houston, MD;  [facility];  Service: Endoscopy;  Laterality: N/A;  [place] Left    rt eye surgery  trauma       Family History  Problem Relation Age of Onset   Alzheimer's disease Sister    Breast cancer Sister    Breast cancer Sister     Social History   Tobacco Use   Smoking status: Former Smoker    Packs/day: 1.00    Years: 28.00    Pack years: 28.00    Types: Cigarettes    Quit date:  10/05/1992    Years since quitting: 27.6   Smokeless tobacco: Current User    Types: Chew  Vaping Use   Vaping Use: Never used  Substance Use Topics   Alcohol use: Yes    Comment: occasonally   Drug use: No    Home Medications Prior to Admission medications   Medication Sig Start Date End Date Taking? Authorizing Provider  acetaminophen (TYLENOL) 500 MG tablet Take 500-1,000 mg by mouth every 6 (six) hours as needed for moderate pain or headache.     [provider]  amLODipine (NORVASC) 5 MG tablet Take 5 mg by mouth daily.     [provider]  aspirin EC 81 MG tablet Take 1 tablet (81 mg total) by mouth daily. 03/04/20   Rogene Houston, MD  metoprolol tartrate (LOPRESSOR) 25 MG tablet Take 12.5 mg by mouth in the morning and at bedtime. 01/20/20   [provider]  montelukast (SINGULAIR) 10 MG tablet Take 1 tablet (10 mg total) by mouth daily. 12/27/19   Rogene Houston, MD  omeprazole (PRILOSEC) 40 MG capsule Take 40 mg by mouth daily. 12/20/19   [provider]  tamsulosin (FLOMAX) 0.4 MG CAPS capsule Take 0.4 mg by mouth daily after breakfast.    [provider]  triamcinolone cream (KENALOG) 0.1 % Apply 1 application topically 3 (three) times daily as needed (psoriasis).  01/21/20   [provider]  zolpidem (AMBIEN) 5 MG tablet Take 5 mg by mouth daily.  01/20/20   [provider]    Allergies    Patient has no known allergies.  Review of Systems   Review of Systems  Constitutional: Negative for appetite change and fatigue.  HENT: Negative for congestion, ear discharge and sinus pressure.   Eyes: Negative for discharge.  Respiratory: Negative for cough.   Cardiovascular: Positive for chest pain.  Gastrointestinal: Negative for abdominal pain and diarrhea.  Genitourinary: Negative for frequency and hematuria.  Musculoskeletal: Negative for back pain.  Skin: Negative for rash.  Neurological: Negative for  seizures and headaches.  Psychiatric/Behavioral: Negative for hallucinations.    Physical Exam Updated Vital Signs BP (!) 155/73    Pulse 66    Temp 98.1 F (36.7 C) (Oral)    Resp 16    Ht 5\' 9"  (1.753 m)    Wt 77.1 kg    SpO2 98%    BMI 25.10 kg/m   Physical Exam Vitals and nursing note reviewed.  Constitutional:      Appearance: He is well-developed.  HENT:     Head: Normocephalic.     Nose: Nose normal.  Eyes:     General: No scleral icterus.    Conjunctiva/sclera: Conjunctivae normal.  Neck:     Thyroid: No thyromegaly.  Cardiovascular:     Rate and Rhythm: Normal rate and regular rhythm.     Heart sounds: No murmur heard.  No friction rub. No gallop.   Pulmonary:     Breath sounds: No stridor. No wheezing or rales.  Chest:  Chest wall: No tenderness.  Abdominal:     General: There is no distension.     Tenderness: There is no abdominal tenderness. There is no rebound.  Musculoskeletal:        General: Normal range of motion.     Cervical back: Neck supple.  Lymphadenopathy:     Cervical: No cervical adenopathy.  Skin:    Findings: No erythema or rash.  Neurological:     Mental Status: He is alert and oriented to person, place, and time.     Motor: No abnormal muscle tone.     Coordination: Coordination normal.  Psychiatric:        Behavior: Behavior normal.     ED Results / Procedures / Treatments   Labs (all labs ordered are listed, but only abnormal results are displayed) Labs Reviewed  BASIC METABOLIC PANEL - Abnormal; Notable for the following components:      Result Value   Glucose, Bld 170 (*)    All other components within normal limits  CBC - Abnormal; Notable for the following components:   WBC 15.6 (*)    All other components within normal limits  TROPONIN I (HIGH SENSITIVITY) - Abnormal; Notable for the following components:   Troponin I (High Sensitivity) 256 (*)    All other components within normal limits  SARS CORONAVIRUS 2 BY RT  PCR (HOSPITAL ORDER, [facility])  APTT  PROTIME-INR  HEPARIN LEVEL (UNFRACTIONATED)  CBC  TROPONIN I (HIGH SENSITIVITY)    EKG EKG Interpretation  Date/Time:  Friday June 01 2020 19:27:57 EDT Ventricular Rate:  61 PR Interval:  190 QRS Duration: 92 QT Interval:  406 QTC Calculation: 408 R Axis:   46 Text Interpretation: Sinus rhythm with Premature atrial complexes Otherwise normal ECG Confirmed by Milton Ferguson [phone]) on 06/01/2020 9:06:08 PM   Radiology DG Chest 2 View  Result Date: 06/01/2020 CLINICAL DATA:  Chest pain EXAM: CHEST - 2 VIEW COMPARISON:  08/16/2019 FINDINGS: The heart size and mediastinal contours are within normal limits. Both lungs are clear. The visualized skeletal structures are unremarkable. IMPRESSION: No active cardiopulmonary disease. Electronically Signed   By: Rolm Baptise M.D.   On: 06/01/2020 21:00    Procedures Procedures (including critical care time)  Medications Ordered in ED Medications  aspirin 81 MG chewable tablet (  Not Given 06/01/20 2117)  heparin bolus via infusion 4,000 Units (has no administration in time range)  heparin ADULT infusion 100 units/mL (25000 units/263mL sodium chloride 0.45%) (has no administration in time range)  aspirin chewable tablet 324 mg (324 mg Oral Given 06/01/20 2111)  morphine 2 MG/ML injection 2 mg (2 mg Intravenous Given 06/01/20 2150)  nitroGLYCERIN (NITROGLYN) 2 % ointment 1 inch (1 inch Topical Given 06/01/20 2149)    ED Course  I have reviewed the triage vital signs and the nursing notes.  Pertinent labs & imaging results that were available during my care of the patient were reviewed by me and considered in my medical decision making (see chart for details). CRITICAL CARE Performed by: Milton Ferguson Total critical care time: 45 minutes Critical care time was exclusive of separately billable procedures and treating other patients. Critical care was necessary to treat or  prevent imminent or life-threatening deterioration. Critical care was time spent personally by me on the following activities: development of treatment plan with patient and/or surrogate as well as nursing, discussions with consultants, evaluation of patient's response to treatment, examination of patient, obtaining history  from patient or surrogate, ordering and performing treatments and interventions, ordering and review of laboratory studies, ordering and review of radiographic studies, pulse oximetry and re-evaluation of patient's condition.         I spoke with cardiology dr. Paticia Stack and he stated that the patient could be either admitted over at [facility] or admitted here and placed on heparin nitro if needed and contact cardiology if he is admitted to [place] or contact cardiology Monday if admitted to any pain.  Cardiology also should be contacted if the patient has any other problems      This patient presents to the ED for concern of chest pain, this involves an extensive number of treatment options, and is a complaint that carries with it a high risk of complications and morbidity.  The differential diagnosis includes MI PE   Lab Tests:   I Ordered, reviewed, and interpreted labs, which included CBC chemistries troponin.  Patient has elevated white count elevated troponin  Medicines ordered:   I ordered medication aspirin heparin for MI  Imaging Studies ordered:   I ordered imaging studies which included chest x-ray  I independently visualized and interpreted imaging which showed unremarkable  Additional history obtained:   Additional history obtained from relative  Previous records obtained and reviewed.  Consultations Obtained:   I consulted cardiology and internist and discussed lab and imaging findings  Reevaluation:  After the interventions stated above, I reevaluated the patient and found improved Critical Interventions:       MDM Rules/Calculators/A&P                               Final Clinical Impression(s) / ED Diagnoses Final diagnoses:  NSTEMI (non-ST elevated myocardial infarction) [facility])    Rx / [place] Orders ED Discharge Orders    None       Milton Ferguson, MD 06/01/20 2210

## 2020-06-01 NOTE — ED Triage Notes (Signed)
Pt reports chest pain that started today around 4 pm. Describes as sharp shooting pain to mid-sternum. Pt reports feeling hot, vomiting, and dizziness. Pt rates chest pain 10 of 10 with some pains shooting through to back.

## 2020-06-01 NOTE — H&P (Signed)
History and Physical    Patient Demographics:    Phillip Kirk HYQ:[id number] DOB: Aug 07, 1951 DOA: 06/01/2020  PCP: Lavella Lemons, PA  Patient coming from: Home  I have personally briefly reviewed patient's old medical records in [place]  Chief Complaint: Chest pain   Assessment & Plan:     Assessment/Plan Principal Problem:   NSTEMI (non-ST elevated myocardial infarction) [facility]) Active Problems:   Essential hypertension, benign   GERD (gastroesophageal reflux disease)   BPH (benign prostatic hyperplasia)     Principal Problem: Chest pain, likely secondary to acute NSTEMI Patient presented with substernal aching chest pain, sudden onset, moderate intensity, no exacerbating or relieving factors.  Onset was around 4 PM.  Associated with nausea, shortness of breath, diaphoresis.  Symptoms improved after presentation to the ER.  EKG shows no evidence of acute ischemia.  Troponin is elevated at 256.  No prior history of coronary artery disease.  Presentation appears to be consistent with acute NSTEMI.  Case was discussed with cardiology who recommended admission to hospital service but will evaluate patient for cardiac cath.  Patient is planned for transfer to [facility]. -Telemetry monitoring, serial troponins -We will place on aspirin, statin -Heparin IV for anticoagulation -Echocardiogram in a.m. -Cardiology consult on transfer -Nitro, morphine as needed   Other Active Problems: Hypertension -Continue amlodipine, metoprolol  Gastroesophageal flux disease -Continue Meprazole  Benign prostatic hypertrophy -Continue tamsulosin    DVT prophylaxis: Heparin Code Status:  Full code Family Communication: N/A  Disposition Plan: Transfer to [place] for cardiology evaluation Consults called: N/A Admission status: Inpatient status    HPI:     HPI: Phillip Kirk is a 69 y.o. male with medical history significant of hypertension, GERD, BPH who  presented to the ER with chest pain.  Patient reports sudden onset sharp, aching substernal chest pain, moderate intensity, nonradiating, associated with diaphoresis starting around 4 PM.  Patient also reported having nausea and multiple episodes of vomiting.  No prior history of coronary artery disease however patient was reports that he may have had a MI 4-1/2 years ago however he was only seen in the urgent care and discharge at the time.  Never had a cardiology evaluation. ED Course:  Vital Signs reviewed on presentation, significant for temperature 98.1, heart rate 67, blood pressure 155/73, saturation 98% on room air. Labs reviewed, significant for sodium 138, potassium 3.8, BUN 16, creatinine 0.95, troponin II 56, WBC count 15.6, hemoglobin 15.8, hematocrit 48, platelets 158, INR 1.0, PTT 27. Imaging personally Reviewed, chest x-ray shows no acute cardiopulmonary disease EKG personally reviewed, shows sinus rhythm, PVCs, no significant acute ischemic changes.    Review of systems:    Review of Systems: As per HPI otherwise 10 point review of systems negative.  All other review of systems is negative except the ones noted above in the HPI.    Past Medical and Surgical History:  Reviewed by me  Past Medical History:  Diagnosis Date  . Chronic kidney disease   . Dysphagia 07/24/2017  . Essential hypertension, benign 07/24/2017  . GERD (gastroesophageal reflux disease)     Past Surgical History:  Procedure Laterality Date  . BIOPSY  07/24/2017   Procedure: BIOPSY;  Surgeon: Rogene Houston, MD;  [facility];  Service: Endoscopy;;  gastric esophagus  . BIOPSY  03/01/2020   Procedure: BIOPSY;  Surgeon: Rogene Houston, MD;  [facility];  Service: Endoscopy;;  . CATARACT EXTRACTION    .  CATARACT EXTRACTION Left   . COLONOSCOPY N/A 02/15/2020   Procedure: COLONOSCOPY;  Surgeon: Rogene Houston, MD;  [facility];  Service: Endoscopy;  Laterality:  N/A;  940  . ESOPHAGEAL BRUSHING  02/15/2020   Procedure: ESOPHAGEAL BRUSHING;  Surgeon: Rogene Houston, MD;  [facility];  Service: Endoscopy;;  . ESOPHAGEAL DILATION N/A 07/24/2017   Procedure: ESOPHAGEAL DILATION;  Surgeon: Rogene Houston, MD;  [facility];  Service: Endoscopy;  Laterality: N/A;  . ESOPHAGEAL DILATION N/A 11/19/2017   Procedure: ESOPHAGEAL DILATION;  Surgeon: Rogene Houston, MD;  [facility];  Service: Endoscopy;  Laterality: N/A;  . ESOPHAGEAL DILATION N/A 02/15/2020   Procedure: ESOPHAGEAL DILATION;  Surgeon: Rogene Houston, MD;  [facility];  Service: Endoscopy;  Laterality: N/A;  . ESOPHAGEAL DILATION N/A 03/01/2020   Procedure: ESOPHAGEAL DILATION;  Surgeon: Rogene Houston, MD;  [facility];  Service: Endoscopy;  Laterality: N/A;  . ESOPHAGOGASTRODUODENOSCOPY N/A 07/24/2017   Procedure: ESOPHAGOGASTRODUODENOSCOPY (EGD);  Surgeon: Rogene Houston, MD;  [facility];  Service: Endoscopy;  Laterality: N/A;  . ESOPHAGOGASTRODUODENOSCOPY N/A 11/19/2017   Procedure: ESOPHAGOGASTRODUODENOSCOPY (EGD);  Surgeon: Rogene Houston, MD;  [facility];  Service: Endoscopy;  Laterality: N/A;  . ESOPHAGOGASTRODUODENOSCOPY N/A 02/15/2020   Procedure: ESOPHAGOGASTRODUODENOSCOPY (EGD);  Surgeon: Rogene Houston, MD;  [facility];  Service: Endoscopy;  Laterality: N/A;  . ESOPHAGOGASTRODUODENOSCOPY N/A 03/01/2020   Procedure: ESOPHAGOGASTRODUODENOSCOPY (EGD);  Surgeon: Rogene Houston, MD;  [facility];  Service: Endoscopy;  Laterality: N/A;  1255  . ROTATOR CUFF REPAIR Left   . rt eye surgery     trauma     Social History:  Reviewed by me   reports that he quit smoking about 27 years ago. His smoking use included cigarettes. He has a 28.00 pack-year smoking history. His smokeless tobacco use includes chew. He reports current alcohol use. He reports that he does not use  drugs.  Allergies:    No Known Allergies  Family History :   Family History  Problem Relation Age of Onset  . Alzheimer's disease Sister   . Breast cancer Sister   . Breast cancer Sister    Family history reviewed, noted as above, not pertinent to current presentation.   Home Medications:    Prior to Admission medications   Medication Sig Start Date End Date Taking? Authorizing Provider  acetaminophen (TYLENOL) 500 MG tablet Take 500-1,000 mg by mouth every 6 (six) hours as needed for moderate pain or headache.    Yes [provider]  amLODipine (NORVASC) 5 MG tablet Take 5 mg by mouth daily.    Yes [provider]  aspirin EC 81 MG tablet Take 1 tablet (81 mg total) by mouth daily. 03/04/20  Yes Rehman, Mechele Dawley, MD  metoprolol tartrate (LOPRESSOR) 25 MG tablet Take 12.5 mg by mouth in the morning and at bedtime. 01/20/20  Yes [provider]  montelukast (SINGULAIR) 10 MG tablet Take 1 tablet (10 mg total) by mouth daily. 12/27/19  Yes Rehman, Mechele Dawley, MD  omeprazole (PRILOSEC) 40 MG capsule Take 40 mg by mouth daily. 12/20/19  Yes [provider]  tamsulosin (FLOMAX) 0.4 MG CAPS capsule Take 0.4 mg by mouth daily after breakfast.   Yes [provider]  triamcinolone cream (KENALOG) 0.1 % Apply 1 application topically 3 (three) times daily as needed (psoriasis).  01/21/20  Yes [provider]  zolpidem (AMBIEN) 5 MG tablet Take 5 mg by mouth daily.  01/20/20  Yes [provider]    Physical Exam:    Physical Exam: Vitals:   06/01/20 1928 06/01/20 1933 06/01/20 2145  BP:  (!) 162/73 (!) 155/73  Pulse:  77 66  Resp:  20 16  Temp:  98.1 F (36.7 C)   TempSrc:  Oral   SpO2:  99% 98%  Weight: 77.1 kg    Height: 5\' 9"  (1.753 m)      Constitutional: NAD, calm, comfortable Vitals:   06/01/20 1928 06/01/20 1933 06/01/20 2145  BP:  (!) 162/73 (!) 155/73  Pulse:  77 66  Resp:  20 16  Temp:  98.1 F (36.7 C)     TempSrc:  Oral   SpO2:  99% 98%  Weight: 77.1 kg    Height: 5\' 9"  (1.753 m)     Eyes: PERRL, lids and conjunctivae normal ENMT: Mucous membranes are moist. Posterior pharynx clear of any exudate or lesions.Normal dentition.  Neck: normal, supple, no masses, no thyromegaly Respiratory: clear to auscultation bilaterally, no wheezing, no crackles. Normal respiratory effort. No accessory muscle use.  Cardiovascular: Regular rate and rhythm, no murmurs / rubs / gallops. No extremity edema. 2+ pedal pulses. No carotid bruits.  Abdomen: no tenderness, no masses palpated. No hepatosplenomegaly. Bowel sounds positive.  Musculoskeletal: no clubbing / cyanosis. No joint deformity upper and lower extremities. Good ROM, no contractures. Normal muscle tone.  Skin: no rashes, lesions, ulcers. No induration Neurologic: CN 2-12 grossly intact. Sensation intact, DTR normal. Strength 5/5 in all 4.  Psychiatric: Normal judgment and insight. Alert and oriented x 3. Normal mood.    Decubitus Ulcers: Not present on admission Catheters and tubes: None  Data Review:    Labs on Admission: I have personally reviewed following labs and imaging studies  CBC: Recent Labs  Lab 06/01/20 1950  WBC 15.6*  HGB 15.8  HCT 48.0  MCV 89.2  PLT 409   Basic Metabolic Panel: Recent Labs  Lab 06/01/20 1950  NA 138  K 3.8  CL 100  CO2 26  GLUCOSE 170*  BUN 16  CREATININE 0.95  CALCIUM 9.4   GFR: Estimated Creatinine Clearance: 73.4 mL/min (by C-G formula based on SCr of 0.95 mg/dL). Liver Function Tests: No results for input(s): AST, ALT, ALKPHOS, BILITOT, PROT, ALBUMIN in the last 168 hours. No results for input(s): LIPASE, AMYLASE in the last 168 hours. No results for input(s): AMMONIA in the last 168 hours. Coagulation Profile: Recent Labs  Lab 06/01/20 1950  INR 1.0   Cardiac Enzymes: No results for input(s): CKTOTAL, CKMB, CKMBINDEX, TROPONINI in the last 168 hours. BNP (last 3 results) No  results for input(s): PROBNP in the last 8760 hours. HbA1C: No results for input(s): HGBA1C in the last 72 hours. CBG: No results for input(s): GLUCAP in the last 168 hours. Lipid Profile: No results for input(s): CHOL, HDL, LDLCALC, TRIG, CHOLHDL, LDLDIRECT in the last 72 hours. Thyroid Function Tests: No results for input(s): TSH, T4TOTAL, FREET4, T3FREE, THYROIDAB in the last 72 hours. Anemia Panel: No results for input(s): VITAMINB12, FOLATE, FERRITIN, TIBC, IRON, RETICCTPCT in the last 72 hours. Urine analysis:    Component Value Date/Time   COLORURINE YELLOW 08/16/2019 [place] 08/16/2019 1148   LABSPEC 1.011 08/16/2019 1148   PHURINE 5.0 08/16/2019 1148   GLUCOSEU NEGATIVE 08/16/2019 1148   HGBUR LARGE (A) 08/16/2019 1148  [place] NEGATIVE 08/16/2019 [place] 08/16/2019 [place] 08/16/2019 1148   NITRITE NEGATIVE 08/16/2019 1148   [place] 08/16/2019 1148     Imaging Results:      Radiological Exams on Admission: DG Chest 2 View  Result Date: 06/01/2020 CLINICAL DATA:  Chest pain EXAM: CHEST - 2 VIEW COMPARISON:  08/16/2019 FINDINGS: The heart size and mediastinal contours are within normal limits. Both lungs are clear. The visualized skeletal structures are unremarkable. IMPRESSION: No active cardiopulmonary disease. Electronically Signed   By: Rolm Baptise M.D.   On: 06/01/2020 21:00      Lynetta Mare MD [facility]  If 7PM-7AM, please contact night-coverage   06/01/2020, 10:42 PM

## 2020-06-02 ENCOUNTER — Inpatient Hospital Stay (HOSPITAL_COMMUNITY): Payer: Medicare Other

## 2020-06-02 ENCOUNTER — Encounter (HOSPITAL_COMMUNITY): Payer: Self-pay | Admitting: Internal Medicine

## 2020-06-02 DIAGNOSIS — I214 Non-ST elevation (NSTEMI) myocardial infarction: Secondary | ICD-10-CM

## 2020-06-02 LAB — CBC
HCT: 42.5 % (ref 39.0–52.0)
Hemoglobin: 14 g/dL (ref 13.0–17.0)
MCH: 28.8 pg (ref 26.0–34.0)
MCHC: 32.9 g/dL (ref 30.0–36.0)
MCV: 87.4 fL (ref 80.0–100.0)
Platelets: 232 10*3/uL (ref 150–400)
RBC: 4.86 MIL/uL (ref 4.22–5.81)
RDW: 13 % (ref 11.5–15.5)
WBC: 13.6 10*3/uL — ABNORMAL HIGH (ref 4.0–10.5)
nRBC: 0 % (ref 0.0–0.2)

## 2020-06-02 LAB — HEPARIN LEVEL (UNFRACTIONATED)
Heparin Unfractionated: 0.3 IU/mL (ref 0.30–0.70)
Heparin Unfractionated: 0.26 IU/mL — ABNORMAL LOW (ref 0.30–0.70)

## 2020-06-02 LAB — TROPONIN I (HIGH SENSITIVITY)
Troponin I (High Sensitivity): 241 ng/L (ref <18)
Troponin I (High Sensitivity): 263 ng/L (ref <18)

## 2020-06-02 LAB — MRSA PCR SCREENING: MRSA by PCR: NEGATIVE

## 2020-06-02 LAB — ECHOCARDIOGRAM COMPLETE
Area-P 1/2: 3.03 cm2
Height: 69 in
S' Lateral: 2.6 cm
Weight: 2740.76 oz

## 2020-06-02 MED ORDER — NITROGLYCERIN 0.4 MG SL SUBL: 0.4000 mg | SUBLINGUAL_TABLET | SUBLINGUAL | Status: DC | PRN

## 2020-06-02 MED ORDER — NITROGLYCERIN 0.4 MG SL SUBL
SUBLINGUAL_TABLET | SUBLINGUAL | Status: AC
  Administered 2020-06-02: 0.4 mg
  Filled 2020-06-02: qty 1

## 2020-06-02 MED ORDER — NITROGLYCERIN 2 % TD OINT
1.0000 [in_us] | TOPICAL_OINTMENT | Freq: Four times a day (QID) | TRANSDERMAL | Status: DC
  Administered 2020-06-02 – 2020-06-04 (×8): 1 [in_us] via TOPICAL
  Filled 2020-06-02: qty 30

## 2020-06-02 MED ORDER — SODIUM CHLORIDE 0.9% FLUSH
3.0000 mL | Freq: Two times a day (BID) | INTRAVENOUS | Status: DC
  Administered 2020-06-02 – 2020-06-07 (×7): 3 mL via INTRAVENOUS

## 2020-06-02 MED ORDER — MORPHINE SULFATE (PF) 2 MG/ML IV SOLN
2.0000 mg | INTRAVENOUS | Status: DC | PRN
  Administered 2020-06-02 – 2020-06-06 (×9): 2 mg via INTRAVENOUS
  Filled 2020-06-02 (×10): qty 1

## 2020-06-02 MED ORDER — PERFLUTREN LIPID MICROSPHERE
1.0000 mL | INTRAVENOUS | Status: AC | PRN
  Administered 2020-06-02: 2 mL via INTRAVENOUS
  Filled 2020-06-02: qty 10

## 2020-06-02 NOTE — ED Notes (Signed)
Repeat EKG done @ 2993

## 2020-06-02 NOTE — Progress Notes (Signed)
ANTICOAGULATION CONSULT NOTE  Pharmacy Consult for Heparin Indication: chest pain/ACS  No Known Allergies  Patient Measurements: Height: 5\' 9"  (175.3 cm) Weight: 77.7 kg (171 lb 4.8 oz) IBW/kg (Calculated) : 70.7 Heparin Dosing Weight: HEPARIN DW (KG): 77.7  Vital Signs: Temp: 98.1 F (36.7 C) (08/28 1133) Temp Source: Oral (08/28 1133) BP: 143/76 (08/28 1242) Pulse Rate: 77 (08/28 1133)  Labs: Recent Labs    06/01/20 1950 06/01/20 1950 06/01/20 2139 06/02/20 0201 06/02/20 0710 06/02/20 1446  HGB 15.8  --   --   --   --  14.0  HCT 48.0  --   --   --   --  42.5  PLT 258  --   --   --   --  232  APTT 27  --   --   --   --   --   LABPROT 12.4  --   --   --   --   --   INR 1.0  --   --   --   --   --   HEPARINUNFRC  --   --   --   --  0.30 0.26*  CREATININE 0.95  --   --   --   --   --   TROPONINIHS 256*   < > 244* 241* 263*  --    < > = values in this interval not displayed.    Estimated Creatinine Clearance: 73.4 mL/min (by C-G formula based on SCr of 0.95 mg/dL).   Medical History: Past Medical History:  Diagnosis Date  . Abnormal stress test    a. 11/2017 MV: EF 69%, HTN response. Ex time 7:13. Mild inf ischemia-->low risk.  . Alcohol abuse   . BPH (benign prostatic hyperplasia)   . Demand ischemia ([place])    a. 01/2020 in setting of urosepsis/bacteremia-->Echo: EF >55%, mild MR/TR. No evidence of veg.  [name] Dysphagia 07/24/2017  . Essential hypertension, benign 07/24/2017  . GERD (gastroesophageal reflux disease)   . History of bacteremia    a. 01/2020 E. coli and Streptococcus gallolyticus bacteremia in setting of urosepsis.  [name] History of tobacco abuse    a. Quit 1993.  [name] Recurrent UTI    a. 01/2020 Urosepsis and bacteremia    Medications:  Medications Prior to Admission  Medication Sig Dispense Refill Last Dose  . acetaminophen (TYLENOL) 500 MG tablet Take 500-1,000 mg by mouth every 6 (six) hours as needed for moderate pain or headache.    Past Week at  Unknown time  . amLODipine (NORVASC) 5 MG tablet Take 5 mg by mouth daily.    06/01/2020 at Unknown time  . aspirin EC 81 MG tablet Take 1 tablet (81 mg total) by mouth daily.   06/01/2020 at 0800  . metoprolol tartrate (LOPRESSOR) 25 MG tablet Take 12.5 mg by mouth in the morning and at bedtime.   06/01/2020 at 1300  . montelukast (SINGULAIR) 10 MG tablet Take 1 tablet (10 mg total) by mouth daily. 90 tablet 3 05/31/2020 at Unknown time  . omeprazole (PRILOSEC) 40 MG capsule Take 40 mg by mouth daily.   06/01/2020 at Unknown time  . tamsulosin (FLOMAX) 0.4 MG CAPS capsule Take 0.4 mg by mouth daily after breakfast.   06/01/2020 at Unknown time  . triamcinolone cream (KENALOG) 0.1 % Apply 1 application topically 3 (three) times daily as needed (psoriasis).    Past Month at Unknown time  . zolpidem (AMBIEN) 5 MG tablet Take 5  mg by mouth daily.    05/31/2020 at Unknown time    Assessment: 69 yo M presented to ED with CP.  To start heparin.  No blood thinners PTA noted.  Heparin level this afternoon came back slightly subtherapeutic at 0.26, on 1200 units/hr. Hgb 14, plt 232 this morning. No s/sx of bleeding or infusion issues per nursing. Drawn correctly.   Goal of Therapy:  Heparin level 0.3-0.7 units/ml Monitor platelets by anticoagulation protocol: Yes   Plan:  Increase IV heparin to 1350 units/hr to get into goal range. Daily heparin level and CBC. F/u plans for cath on Monday.  Antonietta Jewel, PharmD, [place] Clinical Pharmacist  Phone: [phone] 06/02/2020 3:25 PM  Please check AMION for all [place] phone numbers After 10:00 PM, call [place] [phone]

## 2020-06-02 NOTE — Progress Notes (Signed)
ANTICOAGULATION CONSULT NOTE  Pharmacy Consult for Heparin Indication: chest pain/ACS  No Known Allergies  Patient Measurements: Height: 5\' 9"  (175.3 cm) Weight: 77.7 kg (171 lb 4.8 oz) IBW/kg (Calculated) : 70.7 Heparin Dosing Weight: HEPARIN DW (KG): 77.7  Vital Signs: Temp: 98.1 F (36.7 C) (08/28 1133) Temp Source: Oral (08/28 1133) BP: 143/76 (08/28 1242) Pulse Rate: 77 (08/28 1133)  Labs: Recent Labs    06/01/20 1950 06/01/20 1950 06/01/20 2139 06/02/20 0201 06/02/20 0710  HGB 15.8  --   --   --   --   HCT 48.0  --   --   --   --   PLT 258  --   --   --   --   APTT 27  --   --   --   --   LABPROT 12.4  --   --   --   --   INR 1.0  --   --   --   --   HEPARINUNFRC  --   --   --   --  0.30  CREATININE 0.95  --   --   --   --   TROPONINIHS 256*   < > 244* 241* 263*   < > = values in this interval not displayed.    Estimated Creatinine Clearance: 73.4 mL/min (by C-G formula based on SCr of 0.95 mg/dL).   Medical History: Past Medical History:  Diagnosis Date  . Abnormal stress test    a. 11/2017 MV: EF 69%, HTN response. Ex time 7:13. Mild inf ischemia-->low risk.  . Alcohol abuse   . BPH (benign prostatic hyperplasia)   . Demand ischemia ([place])    a. 01/2020 in setting of urosepsis/bacteremia-->Echo: EF >55%, mild MR/TR. No evidence of veg.  [name] Dysphagia 07/24/2017  . Essential hypertension, benign 07/24/2017  . GERD (gastroesophageal reflux disease)   . History of bacteremia    a. 01/2020 E. coli and Streptococcus gallolyticus bacteremia in setting of urosepsis.  [name] History of tobacco abuse    a. Quit 1993.  [name] Recurrent UTI    a. 01/2020 Urosepsis and bacteremia    Medications:  Medications Prior to Admission  Medication Sig Dispense Refill Last Dose  . acetaminophen (TYLENOL) 500 MG tablet Take 500-1,000 mg by mouth every 6 (six) hours as needed for moderate pain or headache.    Past Week at Unknown time  . amLODipine (NORVASC) 5 MG tablet Take 5 mg by  mouth daily.    06/01/2020 at Unknown time  . aspirin EC 81 MG tablet Take 1 tablet (81 mg total) by mouth daily.   06/01/2020 at 0800  . metoprolol tartrate (LOPRESSOR) 25 MG tablet Take 12.5 mg by mouth in the morning and at bedtime.   06/01/2020 at 1300  . montelukast (SINGULAIR) 10 MG tablet Take 1 tablet (10 mg total) by mouth daily. 90 tablet 3 05/31/2020 at Unknown time  . omeprazole (PRILOSEC) 40 MG capsule Take 40 mg by mouth daily.   06/01/2020 at Unknown time  . tamsulosin (FLOMAX) 0.4 MG CAPS capsule Take 0.4 mg by mouth daily after breakfast.   06/01/2020 at Unknown time  . triamcinolone cream (KENALOG) 0.1 % Apply 1 application topically 3 (three) times daily as needed (psoriasis).    Past Month at Unknown time  . zolpidem (AMBIEN) 5 MG tablet Take 5 mg by mouth daily.    05/31/2020 at Unknown time    Assessment: 69 yo M presented to ED  with CP.  To start heparin.  No blood thinners PTA noted.  Heparin level this AM within goal range.  CBC not drawn this AM.  No overt bleeding or complications noted.  Goal of Therapy:  Heparin level 0.3-0.7 units/ml Monitor platelets by anticoagulation protocol: Yes   Plan:  Continue IV heparin at 1200 units/hr Confirm heparin level this afternoon, and will check CBC at this time as well. Daily heparin level and CBC. F/u plans for cath on Monday.  [place] Crane, Roylene Reason, BCCP Clinical Pharmacist  06/02/2020 1:15 PM   [facility] pharmacy phone numbers are listed on [email or web address]   06/02/2020 1:14 PM

## 2020-06-02 NOTE — Plan of Care (Signed)
  Problem: Education: Goal: Knowledge of General Education information will improve Description: Including pain rating scale, medication(s)/side effects and non-pharmacologic comfort measures Outcome: Progressing   Problem: Clinical Measurements: Goal: Ability to maintain clinical measurements within normal limits will improve Outcome: Progressing   Problem: Clinical Measurements: Goal: Diagnostic test results will improve Outcome: Progressing   Problem: Clinical Measurements: Goal: Cardiovascular complication will be avoided Outcome: Progressing   Problem: Coping: Goal: Level of anxiety will decrease Outcome: Progressing   Problem: Pain Managment: Goal: General experience of comfort will improve Outcome: Progressing   Problem: Safety: Goal: Ability to remain free from injury will improve Outcome: Progressing

## 2020-06-02 NOTE — Consult Note (Addendum)
Cardiology Consult    Patient ID: Phillip Kirk MRN: [id number], DOB/AGE: [birth year]   Admit date: 06/01/2020 Date of Consult: 06/02/2020  Primary Physician: Lavella Lemons, PA Primary Cardiologist: No primary care provider on file. Requesting Provider: D. Tat, MD  Patient Profile    Phillip Kirk is a 69 y.o. male with a history of demand ischemia in the setting of urosepsis/bacteremia (01/2020-->nl EF by echo), HTN, GERD, BPH, recurrent UTI's (in setting of prior foley placement), remote tobacco abuse, and etoh abuse, who is being seen today for the evaluation of NSTEMI at the request of Dr. Carles Collet.  Past Medical History   Past Medical History:  Diagnosis Date  . Abnormal stress test    a. 11/2017 MV: EF 69%, HTN response. Ex time 7:13. Mild inf ischemia-->low risk.  . Alcohol abuse   . BPH (benign prostatic hyperplasia)   . Demand ischemia ([place])    a. 01/2020 in setting of urosepsis/bacteremia-->Echo: EF >55%, mild MR/TR. No evidence of veg.  [name] Dysphagia 07/24/2017  . Essential hypertension, benign 07/24/2017  . GERD (gastroesophageal reflux disease)   . History of bacteremia    a. 01/2020 E. coli and Streptococcus gallolyticus bacteremia in setting of urosepsis.  [name] History of tobacco abuse    a. Quit 1993.  [name] Recurrent UTI    a. 01/2020 Urosepsis and bacteremia    Past Surgical History:  Procedure Laterality Date  . BIOPSY  07/24/2017   Procedure: BIOPSY;  Surgeon: Rogene Houston, MD;  [facility];  Service: Endoscopy;;  gastric esophagus  . BIOPSY  03/01/2020   Procedure: BIOPSY;  Surgeon: Rogene Houston, MD;  [facility];  Service: Endoscopy;;  . CATARACT EXTRACTION    . CATARACT EXTRACTION Left   . COLONOSCOPY N/A 02/15/2020   Procedure: COLONOSCOPY;  Surgeon: Rogene Houston, MD;  [facility];  Service: Endoscopy;  Laterality: N/A;  940  . ESOPHAGEAL BRUSHING  02/15/2020   Procedure: ESOPHAGEAL BRUSHING;  Surgeon: Rogene Houston,  MD;  [facility];  Service: Endoscopy;;  . ESOPHAGEAL DILATION N/A 07/24/2017   Procedure: ESOPHAGEAL DILATION;  Surgeon: Rogene Houston, MD;  [facility];  Service: Endoscopy;  Laterality: N/A;  . ESOPHAGEAL DILATION N/A 11/19/2017   Procedure: ESOPHAGEAL DILATION;  Surgeon: Rogene Houston, MD;  [facility];  Service: Endoscopy;  Laterality: N/A;  . ESOPHAGEAL DILATION N/A 02/15/2020   Procedure: ESOPHAGEAL DILATION;  Surgeon: Rogene Houston, MD;  [facility];  Service: Endoscopy;  Laterality: N/A;  . ESOPHAGEAL DILATION N/A 03/01/2020   Procedure: ESOPHAGEAL DILATION;  Surgeon: Rogene Houston, MD;  [facility];  Service: Endoscopy;  Laterality: N/A;  . ESOPHAGOGASTRODUODENOSCOPY N/A 07/24/2017   Procedure: ESOPHAGOGASTRODUODENOSCOPY (EGD);  Surgeon: Rogene Houston, MD;  [facility];  Service: Endoscopy;  Laterality: N/A;  . ESOPHAGOGASTRODUODENOSCOPY N/A 11/19/2017   Procedure: ESOPHAGOGASTRODUODENOSCOPY (EGD);  Surgeon: Rogene Houston, MD;  [facility];  Service: Endoscopy;  Laterality: N/A;  . ESOPHAGOGASTRODUODENOSCOPY N/A 02/15/2020   Procedure: ESOPHAGOGASTRODUODENOSCOPY (EGD);  Surgeon: Rogene Houston, MD;  [facility];  Service: Endoscopy;  Laterality: N/A;  . ESOPHAGOGASTRODUODENOSCOPY N/A 03/01/2020   Procedure: ESOPHAGOGASTRODUODENOSCOPY (EGD);  Surgeon: Rogene Houston, MD;  [facility];  Service: Endoscopy;  Laterality: N/A;  1255  . ROTATOR CUFF REPAIR Left   . rt eye surgery     trauma  Allergies  No Known Allergies  History of Present Illness    69 year old male with a history of hypertension, GERD, BPH, recurrent UTIs, remote tobacco abuse, ongoing alcohol abuse, demand ischemia in the setting of urosepsis (01/2020), and abnormal stress test in 2019.  Stress testing was performed in 2019 as part of a preoperative evaluation for rotator cuff surgery.  He  was seen by one of our [facility] physicians and underwent stress testing, where he was able to walk for 7 minutes and 13 seconds.  Imaging showed mild inferior ischemia.  Overall, the study was felt to be low risk and he was cleared for surgery but was placed on aspirin and statin therapy.  Patient is now retired and has a house in [place], [place].  More recently, he is living locally, just [place], in [place].  In April of this year, he was admitted secondary to urosepsis and bacteremia in the setting of persistent Foley placement times approximately 6 months related to BPH and urologic procedures.  During that hospitalization, he was noted to have elevated troponin.  Echocardiogram was performed and showed normal LV function and thus troponin elevation was felt to be secondary to demand ischemia.  He does not typically experience chest pain or dyspnea with routine activities around his home.  [place]  He was in his usual state of health until the afternoon of August 27, when approximately an hour and 1/2 to 2 hours after eating some spaghetti, he began to experience midsternal chest discomfort associated with nausea.  He vomited with subsequent worsening of chest discomfort and development of diaphoresis.  He was not experiencing dyspnea.  He had an old nitroglycerin, which he presumed to be about 69 years old.  He took 1, but it did not help.  After 2 hours of ongoing symptoms at home, he presented to an urgent care in [place].  He says his blood pressure at the urgent care was in the 180s.  He was referred to [facility] emergency department.  There, he was hypertensive at 162/73.  Lab work was notable for an elevated high-sensitivity troponin of 256 with mild leukocytosis (15.6).  ECG was unremarkable.  Chest x-ray showed no active disease.  He was placed on heparin infusion and was given morphine with eventual relief of  symptoms.  Total duration was approximately 6 hours by his estimation.  He was transferred to [facility] overnight for cardiology evaluation.  He did have a recurrent episode of chest discomfort around 2:58 AM which resolved after additional morphine.  Troponin trend has been flat with his most recent 2 values of 244  241  263.  He is currently chest pain-free.  Inpatient Medications    . amLODipine  5 mg Oral Daily  . aspirin      . aspirin EC  81 mg Oral Daily  . atorvastatin  80 mg Oral Daily  . metoprolol tartrate  12.5 mg Oral BID  . montelukast  10 mg Oral Daily  . pantoprazole  40 mg Oral Daily  . sodium chloride flush  3 mL Intravenous Q12H  . tamsulosin  0.4 mg Oral QPC breakfast  . zolpidem  5 mg Oral QHS  Heparin infusion  Family History    Family History  Problem Relation Age of Onset  . Alzheimer's disease Sister   . Breast cancer Sister   . Breast cancer Sister  He indicated that his mother is deceased. He indicated that his father is deceased. He indicated that three of his four sisters are alive. He indicated that his child is alive. He indicated that his other is alive.   Social History    Social History   Socioeconomic History  . Marital status: Married    Spouse name: Not on file  . Number of children: Not on file  . Years of education: Not on file  . Highest education level: Not on file  Occupational History  . Not on file  Tobacco Use  . Smoking status: Former Smoker    Packs/day: 1.00    Years: 28.00    Pack years: 28.00    Types: Cigarettes    Quit date: 10/05/1992    Years since quitting: 27.6  . Smokeless tobacco: Current User    Types: Chew  Vaping Use  . Vaping Use: Never used  Substance and Sexual Activity  . Alcohol use: Yes    Comment: 6 pack of beer q 2 days  . Drug use: No  . Sexual activity: Not on file  Other Topics Concern  . Not on file  Social History Narrative   Has two homes - one just [place] in [place] and one in  the [place].  Currently retired and living in [place].   Social Determinants of Health   Financial Resource Strain:   . Difficulty of Paying Living Expenses: Not on file  Food Insecurity:   . Worried About Charity fundraiser in the Last Year: Not on file  . Ran Out of Food in the Last Year: Not on file  Transportation Needs:   . Lack of Transportation (Medical): Not on file  . Lack of Transportation (Non-Medical): Not on file  Physical Activity:   . Days of Exercise per Week: Not on file  . Minutes of Exercise per Session: Not on file  Stress:   . Feeling of Stress : Not on file  Social Connections:   . Frequency of Communication with Friends and Family: Not on file  . Frequency of Social Gatherings with Friends and Family: Not on file  . Attends Religious Services: Not on file  . Active Member of Clubs or Organizations: Not on file  . Attends Archivist Meetings: Not on file  . Marital Status: Not on file  Intimate Partner Violence:   . Fear of Current or Ex-Partner: Not on file  . Emotionally Abused: Not on file  . Physically Abused: Not on file  . Sexually Abused: Not on file     Review of Systems    General:  +++ Diaphoresis in the setting of chest pain on August 27.  No chills, fever, night sweats or weight changes.  Cardiovascular:  +++ chest pain, no dyspnea on exertion, edema, orthopnea, palpitations, paroxysmal nocturnal dyspnea. Dermatological: No rash, lesions/masses Respiratory: No cough, dyspnea Urologic: No hematuria, dysuria Abdominal:   +++ nausea, +++ vomiting, no diarrhea, bright red blood per rectum, melena, or hematemesis Neurologic:  No visual changes, wkns, changes in mental status. All other systems reviewed and are otherwise negative except as noted above.  Physical Exam    Blood pressure 110/61, pulse 66, temperature 98 F (36.7 C), temperature source Oral, resp. rate 13, height 5\' 9"  (1.753 m), weight 77.7 kg,  SpO2 95 %.  General: Pleasant, NAD Psych: Normal affect. Neuro: Alert and oriented X 3. Moves all extremities spontaneously. HEENT: Normal  Neck: Supple without bruits or JVD. Lungs:  Resp regular and unlabored, CTA. Heart: RRR no s3, s4, or murmurs. Abdomen: Soft, non-tender, non-distended, BS + x 4.  Extremities: No clubbing, cyanosis or edema. DP/PT/Radials 2+ and equal bilaterally.  Labs    Cardiac Enzymes Recent Labs  Lab 06/01/20 1950 06/01/20 2139 06/02/20 0201 06/02/20 0710  TROPONINIHS 256* 244* 241* 263*      Lab Results  Component Value Date   WBC 15.6 (H) 06/01/2020   HGB 15.8 06/01/2020   HCT 48.0 06/01/2020   MCV 89.2 06/01/2020   PLT 258 06/01/2020    Recent Labs  Lab 06/01/20 1950  NA 138  K 3.8  CL 100  CO2 26  BUN 16  CREATININE 0.95  CALCIUM 9.4  GLUCOSE 170*    Radiology Studies    DG Chest 2 View  Result Date: 06/01/2020 CLINICAL DATA:  Chest pain EXAM: CHEST - 2 VIEW COMPARISON:  08/16/2019 FINDINGS: The heart size and mediastinal contours are within normal limits. Both lungs are clear. The visualized skeletal structures are unremarkable. IMPRESSION: No active cardiopulmonary disease. Electronically Signed   By: Rolm Baptise M.D.   On: 06/01/2020 21:00    ECG & Cardiac Imaging    8/27 - RSR, 61, no acute ST/T changes - personally reviewed. 8/28 - RSR, 94, no acute ST/T changes - personally reviewed.  Assessment & Plan    1.  Midsternal chest pain/elevated high-sensitivity troponin/history of abnormal stress test: 69 year old male with previous abnormal stress test in 2019 suggestive of mild inferior ischemia, who presented on transfer from [facility] following a 6-hour episode of midsternal chest discomfort that began approximately an hour and a half after eating spaghetti and was associated with nausea, vomiting, and diaphoresis.  He did not experience any dyspnea.  Symptoms eventually resolved with morphine.  Initial high sensitive  troponin was elevated 256 though trend has since remained relatively flat at 244  241  263.  He did have a second episode of chest discomfort overnight which was again resolved with morphine.  His ECG is without acute ST or T changes and he is currently pain-free.  Continue aspirin, statin, beta-blocker, and heparin.  We will follow-up echocardiogram today to reevaluate LV function, which was normal by echocardiography at outside hospital in April.  With flat troponin trend, if EF normal, can consider noninvasive evaluation with coronary CT angiography though with previous mildly abnormal stress test and ongoing risk factors, would lean toward cath.  2.  Essential hypertension: Blood pressure elevated on arrival in the setting of severe pain.  Blood pressure currently stable on beta-blocker and amlodipine therapy.  Follow.  3.  Hyperlipidemia: Continue high potency statin therapy.  Follow-up lipids and LFTs.  4.  Alcohol abuse: Currently drinking a sixpack every 2 days.  Will need cessation counseling.  5.  BPH: Continue home dose of Flomax.  Signed, Murray Hodgkins, NP 06/02/2020, 9:04 AM  For questions or updates, please contact   Please consult [email or web address] for contact info under Cardiology/STEMI.   Attending note:  Patient seen and examined.  I reviewed his records and discussed the case with Mr. Sharolyn Douglas NP, I agree with his above assessment.  Mr. Ohm presents in transfer from [facility] having presented with chest discomfort/tightness that followed an episode of stomach upset, nausea and emesis after eating spaghetti approximately 1-1/2 hours earlier.  He states that he could not get comfortable, took an old nitroglycerin without relief, ultimately felt better after being evaluated  in the ER, placed on heparin and given morphine.  He did have an episode of recurring chest tightness early morning hours after transfer, went away with morphine.  At baseline he does not describe any recurring  chest tightness or unusual shortness of breath. He has a history of hypertension and hyperlipidemia.  Prior cardiac testing includes an abnormal Myoview in 2019 demonstrating mild inferior ischemia that was managed medically.  On examination this morning he appears comfortable, no active chest discomfort.  He is afebrile, heart rate in the 70s to 80s in sinus rhythm by telemetry which I personally reviewed.  Cell blood pressure 100-130.  Lungs are clear.  Cardiac exam reveals RRR without gallop or significant murmur.  Pertinent lab work includes high-sensitivity troponin I levels in the 240-260 range, WBC 15.6, hemoglobin 15.8, creatinine 0.95, SARS coronavirus 2 test negative.  Chest x-ray reports no acute process.  I personally reviewed his ECG which shows normal sinus rhythm.  Patient presents with largely atypical chest discomfort (followed a meal with stomach upset and emesis), although in the setting of hypertension and hyperlipidemia with previous abnormal Myoview in 2019 demonstrating mild inferior ischemia.  He had recurrent chest tightness at rest last night.  High-sensitivity troponin I levels are more in range of demand ischemia, relatively flat pattern.  We discussed diagnostic options, he will undergo an echocardiogram today.  After reviewing the risks and benefits, plan is to proceed with diagnostic cardiac catheterization on Monday for clear definition of coronary anatomy.  Satira Sark, M.D., F.A.C.C.

## 2020-06-02 NOTE — Progress Notes (Signed)
°  Echocardiogram 2D Echocardiogram has been performed.  Phillip Kirk 06/02/2020, 2:29 PM

## 2020-06-03 ENCOUNTER — Inpatient Hospital Stay (HOSPITAL_COMMUNITY): Payer: Medicare Other

## 2020-06-03 LAB — URINALYSIS, ROUTINE W REFLEX MICROSCOPIC
Bilirubin Urine: NEGATIVE
Glucose, UA: NEGATIVE mg/dL
Ketones, ur: NEGATIVE mg/dL
Nitrite: NEGATIVE
Protein, ur: 30 mg/dL — AB
Specific Gravity, Urine: 1.02 (ref 1.005–1.030)
pH: 5 (ref 5.0–8.0)

## 2020-06-03 LAB — GLUCOSE, CAPILLARY
Glucose-Capillary: 132 mg/dL — ABNORMAL HIGH (ref 70–99)
Glucose-Capillary: 137 mg/dL — ABNORMAL HIGH (ref 70–99)
Glucose-Capillary: 141 mg/dL — ABNORMAL HIGH (ref 70–99)

## 2020-06-03 LAB — HEPATIC FUNCTION PANEL
ALT: 15 U/L (ref 0–44)
AST: 18 U/L (ref 15–41)
Albumin: 3.1 g/dL — ABNORMAL LOW (ref 3.5–5.0)
Alkaline Phosphatase: 53 U/L (ref 38–126)
Bilirubin, Direct: 0.2 mg/dL (ref 0.0–0.2)
Indirect Bilirubin: 0.8 mg/dL (ref 0.3–0.9)
Total Bilirubin: 1 mg/dL (ref 0.3–1.2)
Total Protein: 6.5 g/dL (ref 6.5–8.1)

## 2020-06-03 LAB — CBC
HCT: 42.1 % (ref 39.0–52.0)
Hemoglobin: 13.8 g/dL (ref 13.0–17.0)
MCH: 28.8 pg (ref 26.0–34.0)
MCHC: 32.8 g/dL (ref 30.0–36.0)
MCV: 87.7 fL (ref 80.0–100.0)
Platelets: 225 10*3/uL (ref 150–400)
RBC: 4.8 MIL/uL (ref 4.22–5.81)
RDW: 13 % (ref 11.5–15.5)
WBC: 18.3 10*3/uL — ABNORMAL HIGH (ref 4.0–10.5)
nRBC: 0 % (ref 0.0–0.2)

## 2020-06-03 LAB — HEPARIN LEVEL (UNFRACTIONATED)
Heparin Unfractionated: 0.21 IU/mL — ABNORMAL LOW (ref 0.30–0.70)
Heparin Unfractionated: <0.1 IU/mL — ABNORMAL LOW (ref 0.30–0.70)
Heparin Unfractionated: 0.1 IU/mL — ABNORMAL LOW (ref 0.30–0.70)

## 2020-06-03 LAB — LIPASE, BLOOD: Lipase: 23 U/L (ref 11–51)

## 2020-06-03 MED ORDER — DICLOFENAC SODIUM 1 % EX GEL
2.0000 g | Freq: Four times a day (QID) | CUTANEOUS | Status: DC
  Administered 2020-06-03 – 2020-06-07 (×12): 2 g via TOPICAL
  Filled 2020-06-03: qty 100

## 2020-06-03 MED ORDER — IOHEXOL 9 MG/ML PO SOLN
500.0000 mL | ORAL | Status: AC
  Administered 2020-06-03 (×2): 500 mL via ORAL

## 2020-06-03 MED ORDER — PIPERACILLIN-TAZOBACTAM 3.375 G IVPB
3.3750 g | Freq: Three times a day (TID) | INTRAVENOUS | Status: DC
  Administered 2020-06-03 – 2020-06-04 (×2): 3.375 g via INTRAVENOUS
  Filled 2020-06-03 (×2): qty 50

## 2020-06-03 MED ORDER — ACETAMINOPHEN 325 MG PO TABS
650.0000 mg | ORAL_TABLET | Freq: Four times a day (QID) | ORAL | Status: DC | PRN
  Administered 2020-06-03 – 2020-06-04 (×4): 650 mg via ORAL
  Filled 2020-06-03 (×4): qty 2

## 2020-06-03 MED ORDER — INSULIN ASPART 100 UNIT/ML ~~LOC~~ SOLN: 0.0000 [IU] | Freq: Three times a day (TID) | SUBCUTANEOUS | Status: DC

## 2020-06-03 MED ORDER — IOHEXOL 300 MG/ML  SOLN
100.0000 mL | Freq: Once | INTRAMUSCULAR | Status: AC | PRN
  Administered 2020-06-03: 100 mL via INTRAVENOUS

## 2020-06-03 NOTE — Plan of Care (Signed)

## 2020-06-03 NOTE — Consult Note (Signed)
Reason for Consult:  Abdominal pain Referring Physician: Eliseo Squires, DO  Chukwudi Ewen is an 69 y.o. male.  HPI:  Pt is a 69 yo M who presented to [facility] with with chest pain.  He has a history of equivocal cardiac testing in 2019 and 01/2020 with demand ischemia present.  [facility].  The pain came on around 1-1.5 hours after eating spaghetti.  He also had n/v present. troponins were a bit elevated, but were stable. He developed a fever yesterday in the setting of more abdominal discomfort and abdominal CT was obtained raising concern for possible cholecystitis or pyelonephritis.   There was a 1 cm stone in the gallbladder with some inflammatory stranding near the gallbladder neck.    His LFTs and lipase were normal, but he developed leukocytosis.    Past Medical History:  Diagnosis Date  . Abnormal stress test    a. 11/2017 MV: EF 69%, HTN response. Ex time 7:13. Mild inf ischemia-->low risk.  . Alcohol abuse   . BPH (benign prostatic hyperplasia)   . Demand ischemia ([place])    a. 01/2020 in setting of urosepsis/bacteremia-->Echo: EF >55%, mild MR/TR. No evidence of veg.  [name] Dysphagia 07/24/2017  . Essential hypertension, benign 07/24/2017  . GERD (gastroesophageal reflux disease)   . History of bacteremia    a. 01/2020 E. coli and Streptococcus gallolyticus bacteremia in setting of urosepsis.  [name] History of tobacco abuse    a. Quit 1993.  [name] Recurrent UTI    a. 01/2020 Urosepsis and bacteremia    Past Surgical History:  Procedure Laterality Date  . BIOPSY  07/24/2017   Procedure: BIOPSY;  Surgeon: Rogene Houston, MD;  [facility];  Service: Endoscopy;;  gastric esophagus  . BIOPSY  03/01/2020   Procedure: BIOPSY;  Surgeon: Rogene Houston, MD;  [facility];  Service: Endoscopy;;  . CATARACT EXTRACTION    . CATARACT EXTRACTION Left   . COLONOSCOPY N/A 02/15/2020   Procedure: COLONOSCOPY;  Surgeon: Rogene Houston, MD;   [facility];  Service: Endoscopy;  Laterality: N/A;  940  . ESOPHAGEAL BRUSHING  02/15/2020   Procedure: ESOPHAGEAL BRUSHING;  Surgeon: Rogene Houston, MD;  [facility];  Service: Endoscopy;;  . ESOPHAGEAL DILATION N/A 07/24/2017   Procedure: ESOPHAGEAL DILATION;  Surgeon: Rogene Houston, MD;  [facility];  Service: Endoscopy;  Laterality: N/A;  . ESOPHAGEAL DILATION N/A 11/19/2017   Procedure: ESOPHAGEAL DILATION;  Surgeon: Rogene Houston, MD;  [facility];  Service: Endoscopy;  Laterality: N/A;  . ESOPHAGEAL DILATION N/A 02/15/2020   Procedure: ESOPHAGEAL DILATION;  Surgeon: Rogene Houston, MD;  [facility];  Service: Endoscopy;  Laterality: N/A;  . ESOPHAGEAL DILATION N/A 03/01/2020   Procedure: ESOPHAGEAL DILATION;  Surgeon: Rogene Houston, MD;  [facility];  Service: Endoscopy;  Laterality: N/A;  . ESOPHAGOGASTRODUODENOSCOPY N/A 07/24/2017   Procedure: ESOPHAGOGASTRODUODENOSCOPY (EGD);  Surgeon: Rogene Houston, MD;  [facility];  Service: Endoscopy;  Laterality: N/A;  . ESOPHAGOGASTRODUODENOSCOPY N/A 11/19/2017   Procedure: ESOPHAGOGASTRODUODENOSCOPY (EGD);  Surgeon: Rogene Houston, MD;  [facility];  Service: Endoscopy;  Laterality: N/A;  . ESOPHAGOGASTRODUODENOSCOPY N/A 02/15/2020   Procedure: ESOPHAGOGASTRODUODENOSCOPY (EGD);  Surgeon: Rogene Houston, MD;  [facility];  Service: Endoscopy;  Laterality: N/A;  . ESOPHAGOGASTRODUODENOSCOPY N/A 03/01/2020   Procedure: ESOPHAGOGASTRODUODENOSCOPY (EGD);  Surgeon: Laural Golden,  Mechele Dawley, MD;  [facility];  Service: Endoscopy;  Laterality: N/A;  1255  . ROTATOR CUFF REPAIR Left   . rt eye surgery     trauma    Family History  Problem Relation Age of Onset  . Alzheimer's disease Sister   . Breast cancer Sister   . Breast cancer Sister     Social History:  reports that he quit smoking about 27 years ago. His smoking  use included cigarettes. He has a 28.00 pack-year smoking history. His smokeless tobacco use includes chew. He reports current alcohol use. He reports that he does not use drugs.  Allergies: No Known Allergies  Medications:  Current Meds  Medication Sig  . acetaminophen (TYLENOL) 500 MG tablet Take 500-1,000 mg by mouth every 6 (six) hours as needed for moderate pain or headache.   [name] amLODipine (NORVASC) 5 MG tablet Take 5 mg by mouth daily.   [name] aspirin EC 81 MG tablet Take 1 tablet (81 mg total) by mouth daily.  . metoprolol tartrate (LOPRESSOR) 25 MG tablet Take 12.5 mg by mouth in the morning and at bedtime.  . montelukast (SINGULAIR) 10 MG tablet Take 1 tablet (10 mg total) by mouth daily.  [name] omeprazole (PRILOSEC) 40 MG capsule Take 40 mg by mouth daily.  . tamsulosin (FLOMAX) 0.4 MG CAPS capsule Take 0.4 mg by mouth daily after breakfast.  . triamcinolone cream (KENALOG) 0.1 % Apply 1 application topically 3 (three) times daily as needed (psoriasis).   [name] zolpidem (AMBIEN) 5 MG tablet Take 5 mg by mouth daily.      Results for orders placed or performed during the hospital encounter of 06/01/20 (from the past 48 hour(s))  Basic metabolic panel     Status: Abnormal   Collection Time: 06/01/20  7:50 PM  Result Value Ref Range   Sodium 138 135 - 145 mmol/L   Potassium 3.8 3.5 - 5.1 mmol/L   Chloride 100 98 - 111 mmol/L   CO2 26 22 - 32 mmol/L   Glucose, Bld 170 (H) 70 - 99 mg/dL    Comment: Glucose reference range applies only to samples taken after fasting for at least 8 hours.   BUN 16 8 - 23 mg/dL   Creatinine, Ser 0.95 0.61 - 1.24 mg/dL   Calcium 9.4 8.9 - 10.3 mg/dL   GFR calc non Af Amer >60 >60 mL/min   GFR calc Af Amer >60 >60 mL/min   Anion gap 12 5 - 15    Comment: Performed at [facility], [address] [place], [place] [address]  CBC     Status: Abnormal   Collection Time: 06/01/20  7:50 PM  Result Value Ref Range   WBC 15.6 (H) 4.0 - 10.5 K/uL   RBC 5.38 4.22 -  5.81 MIL/uL   Hemoglobin 15.8 13.0 - 17.0 g/dL   HCT 48.0 39 - 52 %   MCV 89.2 80.0 - 100.0 fL   MCH 29.4 26.0 - 34.0 pg   MCHC 32.9 30.0 - 36.0 g/dL   RDW 13.0 11.5 - 15.5 %   Platelets 258 150 - 400 K/uL   nRBC 0.0 0.0 - 0.2 %    Comment: Performed at [facility], [address], [place], [place] [address]  Troponin I (High Sensitivity)     Status: Abnormal   Collection Time: 06/01/20  7:50 PM  Result Value Ref Range   Troponin I (High Sensitivity) 256 (HH) <18 ng/L  Comment: CRITICAL RESULT CALLED TO, READ BACK BY AND VERIFIED WITH: EVANS,H ON 06/01/20 AT 2100 BY LOY,C (NOTE) Elevated high sensitivity troponin I (hsTnI) values and significant  changes across serial measurements may suggest ACS but many other  chronic and acute conditions are known to elevate hsTnI results.  Refer to the Links section for chest pain algorithms and additional  guidance. Performed at [facility], [address] [place], [place] [address]   APTT     Status: None   Collection Time: 06/01/20  7:50 PM  Result Value Ref Range   aPTT 27 24 - 36 seconds    Comment: Performed at [facility], [address], [place], [place] [address]  Protime-INR     Status: None   Collection Time: 06/01/20  7:50 PM  Result Value Ref Range   Prothrombin Time 12.4 11.4 - 15.2 seconds   INR 1.0 0.8 - 1.2    Comment: (NOTE) INR goal varies based on device and disease states. Performed at [facility], [address] [place], [place] [address]   Troponin I (High Sensitivity)     Status: Abnormal   Collection Time: 06/01/20  9:39 PM  Result Value Ref Range   Troponin I (High Sensitivity) 244 (HH) <18 ng/L    Comment: CRITICAL VALUE NOTED.  VALUE IS CONSISTENT WITH PREVIOUSLY REPORTED AND CALLED VALUE. (NOTE) Elevated high sensitivity troponin I (hsTnI) values and significant  changes across serial measurements may suggest ACS but many other  chronic and acute conditions are known to elevate hsTnI results.  Refer  to the Links section for chest pain algorithms and additional  guidance. Performed at [facility], [address] [place], [place] [address]   SARS Coronavirus 2 by RT PCR (hospital order, performed in [facility] hospital lab) Nasopharyngeal Nasopharyngeal Swab     Status: None   Collection Time: 06/01/20  9:55 PM   Specimen: Nasopharyngeal Swab  Result Value Ref Range   SARS Coronavirus 2 NEGATIVE NEGATIVE    Comment: (NOTE) SARS-CoV-2 target nucleic acids are NOT DETECTED.  The SARS-CoV-2 RNA is generally detectable in upper and lower respiratory specimens during the acute phase of infection. The lowest concentration of SARS-CoV-2 viral copies this assay can detect is 250 copies / mL. A negative result does not preclude SARS-CoV-2 infection and should not be used as the sole basis for treatment or other patient management decisions.  A negative result may occur with improper specimen collection / handling, submission of specimen other than nasopharyngeal swab, presence of viral mutation(s) within the areas targeted by this assay, and inadequate number of viral copies (<250 copies / mL). A negative result must be combined with clinical observations, patient history, and epidemiological information.  Fact Sheet for Patients:   StrictlyIdeas.no  Fact Sheet for Healthcare Providers: BankingDealers.co.za  This test is not yet approved or  cleared by the [place] FDA and has been authorized for detection and/or diagnosis of SARS-CoV-2 by FDA under an Emergency Use Authorization (EUA).  This EUA will remain in effect (meaning this test can be used) for the duration of the COVID-19 declaration under Section 564(b)(1) of the Act, 21 U.S.C. section 360bbb-3(b)(1), unless the authorization is terminated or revoked sooner.  Performed at [facility], [address] [place], [place] [address]   Troponin I (High Sensitivity)     Status:  Abnormal   Collection Time: 06/02/20  2:01 AM  Result Value Ref Range   Troponin I (High Sensitivity) 241 (HH) <18  ng/L    Comment: CRITICAL VALUE NOTED.  VALUE IS CONSISTENT WITH PREVIOUSLY REPORTED AND CALLED VALUE. (NOTE) Elevated high sensitivity troponin I (hsTnI) values and significant  changes across serial measurements may suggest ACS but many other  chronic and acute conditions are known to elevate hsTnI results.  Refer to the Links section for chest pain algorithms and additional  guidance. Performed at [facility], [address] [place], [place] [address]   MRSA PCR Screening     Status: None   Collection Time: 06/02/20  5:50 AM   Specimen: Nasal Mucosa; Nasopharyngeal  Result Value Ref Range   MRSA by PCR NEGATIVE NEGATIVE    Comment:        The GeneXpert MRSA Assay (FDA approved for NASAL specimens only), is one component of a comprehensive MRSA colonization surveillance program. It is not intended to diagnose MRSA infection nor to guide or monitor treatment for MRSA infections. Performed at [facility], [place] [address], [place], [place] [address]   Troponin I (High Sensitivity)     Status: Abnormal   Collection Time: 06/02/20  7:10 AM  Result Value Ref Range   Troponin I (High Sensitivity) 263 (HH) <18 ng/L    Comment: CRITICAL RESULT CALLED TO, READ BACK BY AND VERIFIED WITH: R.ARYEL RN @ [phone] 06/02/2020 BY C.EDENS (NOTE) Elevated high sensitivity troponin I (hsTnI) values and significant  changes across serial measurements may suggest ACS but many other  chronic and acute conditions are known to elevate hsTnI results.  Refer to the Links section for chest pain algorithms and additional  guidance. Performed at [facility], [place] [address] [place], [place] [address]   Heparin level (unfractionated)     Status: None   Collection Time: 06/02/20  7:10 AM  Result Value Ref Range   Heparin Unfractionated 0.30 0.30 - 0.70 IU/mL    Comment:  (NOTE) If heparin results are below expected values, and patient dosage has  been confirmed, suggest follow up testing of antithrombin III levels. Performed at [facility], [place] [address] [place] [address]   CBC     Status: Abnormal   Collection Time: 06/02/20  2:46 PM  Result Value Ref Range   WBC 13.6 (H) 4.0 - 10.5 K/uL   RBC 4.86 4.22 - 5.81 MIL/uL   Hemoglobin 14.0 13.0 - 17.0 g/dL   HCT 42.5 39 - 52 %   MCV 87.4 80.0 - 100.0 fL   MCH 28.8 26.0 - 34.0 pg   MCHC 32.9 30.0 - 36.0 g/dL   RDW 13.0 11.5 - 15.5 %   Platelets 232 150 - 400 K/uL   nRBC 0.0 0.0 - 0.2 %    Comment: Performed at [facility], [place] [address], [place], [place] [address]  Heparin level (unfractionated)     Status: Abnormal   Collection Time: 06/02/20  2:46 PM  Result Value Ref Range   Heparin Unfractionated 0.26 (L) 0.30 - 0.70 IU/mL    Comment: (NOTE) If heparin results are below expected values, and patient dosage has  been confirmed, suggest follow up testing of antithrombin III levels. Performed at [facility], [place] [address] [place] [address]   CBC     Status: Abnormal   Collection Time: 06/03/20  1:15 AM  Result Value Ref Range   WBC 18.3 (H) 4.0 - 10.5 K/uL   RBC 4.80 4.22 - 5.81 MIL/uL   Hemoglobin 13.8 13.0 -  17.0 g/dL   HCT 42.1 39 - 52 %   MCV 87.7 80.0 - 100.0 fL   MCH 28.8 26.0 - 34.0 pg   MCHC 32.8 30.0 - 36.0 g/dL   RDW 13.0 11.5 - 15.5 %   Platelets 225 150 - 400 K/uL   nRBC 0.0 0.0 - 0.2 %    Comment: Performed at [facility], [place] [address], [place], [place] [address]  Heparin level (unfractionated)     Status: Abnormal   Collection Time: 06/03/20  1:15 AM  Result Value Ref Range   Heparin Unfractionated 0.21 (L) 0.30 - 0.70 IU/mL    Comment: (NOTE) If heparin results are below expected values, and patient dosage has  been confirmed, suggest follow up testing of antithrombin III levels. Performed at [facility],  [place] [address], [place], [place] [address]   Hepatic function panel     Status: Abnormal   Collection Time: 06/03/20 10:33 AM  Result Value Ref Range   Total Protein 6.5 6.5 - 8.1 g/dL   Albumin 3.1 (L) 3.5 - 5.0 g/dL   AST 18 15 - 41 U/L   ALT 15 0 - 44 U/L   Alkaline Phosphatase 53 38 - 126 U/L   Total Bilirubin 1.0 0.3 - 1.2 mg/dL   Bilirubin, Direct 0.2 0.0 - 0.2 mg/dL   Indirect Bilirubin 0.8 0.3 - 0.9 mg/dL    Comment: Performed at [place] [address], [place], [place] [address]  Lipase, blood     Status: None   Collection Time: 06/03/20 10:33 AM  Result Value Ref Range   Lipase 23 11 - 51 U/L    Comment: Performed at [place] [address] [place], [place] [address]  Urinalysis, Routine w reflex microscopic     Status: Abnormal   Collection Time: 06/03/20 11:10 AM  Result Value Ref Range   Color, Urine YELLOW YELLOW   APPearance HAZY (A) CLEAR   Specific Gravity, Urine 1.020 1.005 - 1.030   pH 5.0 5.0 - 8.0   Glucose, UA NEGATIVE NEGATIVE mg/dL   Hgb urine dipstick SMALL (A) NEGATIVE   Bilirubin Urine NEGATIVE NEGATIVE   Ketones, ur NEGATIVE NEGATIVE mg/dL   Protein, ur 30 (A) NEGATIVE mg/dL   Nitrite NEGATIVE NEGATIVE   Leukocytes,Ua MODERATE (A) NEGATIVE   RBC / HPF 0-5 0 - 5 RBC/hpf   WBC, UA 21-50 0 - 5 WBC/hpf   Bacteria, UA RARE (A) NONE SEEN   Squamous Epithelial / LPF 0-5 0 - 5   Mucus PRESENT     Comment: Performed at [facility], [place] [address], [place], [place] [address]  Glucose, capillary     Status: Abnormal   Collection Time: 06/03/20 11:58 AM  Result Value Ref Range   Glucose-Capillary 132 (H) 70 - 99 mg/dL    Comment: Glucose reference range applies only to samples taken after fasting for at least 8 hours.  Glucose, capillary     Status: Abnormal   Collection Time: 06/03/20  3:45 PM  Result Value Ref Range   Glucose-Capillary 137 (H) 70 - 99 mg/dL    Comment: Glucose reference range applies only to samples taken  after fasting for at least 8 hours.    DG Chest 2 View  Result Date: 06/01/2020 CLINICAL DATA:  Chest pain EXAM: CHEST - 2 VIEW COMPARISON:  08/16/2019 FINDINGS: The heart size and mediastinal contours are within normal limits. Both lungs  are clear. The visualized skeletal structures are unremarkable. IMPRESSION: No active cardiopulmonary disease. Electronically Signed   By: Rolm Baptise M.D.   On: 06/01/2020 21:00   CT ABDOMEN PELVIS W CONTRAST  Result Date: 06/03/2020 CLINICAL DATA:  Abdominal pain and fever. EXAM: CT ABDOMEN AND PELVIS WITH CONTRAST TECHNIQUE: Multidetector CT imaging of the abdomen and pelvis was performed using the standard protocol following bolus administration of intravenous contrast. CONTRAST:  180mL OMNIPAQUE IOHEXOL 300 MG/ML  SOLN COMPARISON:  November 28, 2019 FINDINGS: Lower chest: Mild atelectasis is seen within the posterior aspect of the bilateral lung bases. Hepatobiliary: There is diffuse fatty infiltration of the liver parenchyma. No focal liver abnormality is seen. A 1.0 cm gallstone is seen within the gallbladder lumen without evidence of gallbladder wall thickening or biliary dilatation. Mild pericholecystic inflammatory fat stranding is seen near the neck of the gallbladder. Pancreas: Unremarkable. No pancreatic ductal dilatation or surrounding inflammatory changes. Spleen: Normal in size without focal abnormality. Adrenals/Urinary Tract: Adrenal glands are unremarkable. Kidneys are normal in size, without renal calculi or hydronephrosis. A 1.3 cm simple cyst is seen within the anterior aspect of the mid left kidney. Bladder is unremarkable. Stomach/Bowel: Stomach is within normal limits. Appendix appears normal. A 1.3 cm fat density lesion is seen within the lumen of the proximal duodenum. No evidence of bowel wall thickening, distention, or inflammatory changes. Noninflamed diverticula are seen within the proximal sigmoid colon. Vascular/Lymphatic: No significant  vascular findings are present. No enlarged abdominal or pelvic lymph nodes. Reproductive: The prostate gland is markedly enlarged. Other: No abdominal wall hernia or abnormality. No abdominopelvic ascites. Musculoskeletal: No acute or significant osseous findings. IMPRESSION: 1. Cholelithiasis with additional findings consistent with acute cholecystitis. 2. Hepatic steatosis. 3. Sigmoid diverticulosis. 4. Markedly enlarged prostate gland. Electronically Signed   By: Virgina Norfolk M.D.   On: 06/03/2020 16:03   ECHOCARDIOGRAM COMPLETE  Result Date: 06/02/2020    ECHOCARDIOGRAM REPORT   Patient Name:   [facility] Date of Exam: 06/02/2020 Medical Rec #:  [id number]     Height:       69.0 in Accession #:    [id number]    Weight:       171.3 lb Date of Birth:  [birth year]      BSA:          1.934 m Patient Age:    62 years      BP:           143/76 mmHg Patient Gender: M             HR:           77 bpm. Exam Location:  Inpatient Procedure: 2D Echo, Cardiac Doppler, Color Doppler and Intracardiac            Opacification Agent Indications:    NSTEMI  History:        Patient has no prior history of Echocardiogram examinations.                 Acute MI, Signs/Symptoms:Chest Pain and Shortness of Breath;                 Risk Factors:Hypertension and Former Smoker. ETOH abuse.  Sonographer:    Dustin Flock Referring Phys: [id number] [place]  1. Left ventricular ejection fraction, by estimation, is 55 to 60%. The left ventricle has normal function. The left ventricle has no regional wall motion abnormalities. There is mild left ventricular hypertrophy. Left ventricular  diastolic parameters were normal.  2. Right ventricular systolic function is normal. The right ventricular size is normal. There is normal pulmonary artery systolic pressure. The estimated right ventricular systolic pressure is 16.1 mmHg.  3. The mitral valve is grossly normal. No evidence of mitral valve regurgitation.  4. The  aortic valve is tricuspid. Aortic valve regurgitation is mild.  5. The inferior vena cava is normal in size with greater than 50% respiratory variability, suggesting right atrial pressure of 3 mmHg. FINDINGS  Left Ventricle: Left ventricular ejection fraction, by estimation, is 55 to 60%. The left ventricle has normal function. The left ventricle has no regional wall motion abnormalities. Definity contrast agent was given IV to delineate the left ventricular  endocardial borders. The left ventricular internal cavity size was normal in size. There is mild left ventricular hypertrophy. Left ventricular diastolic parameters were normal. Right Ventricle: The right ventricular size is normal. No increase in right ventricular wall thickness. Right ventricular systolic function is normal. There is normal pulmonary artery systolic pressure. The tricuspid regurgitant velocity is 2.87 m/s, and  with an assumed right atrial pressure of 3 mmHg, the estimated right ventricular systolic pressure is 09.6 mmHg. Left Atrium: Left atrial size was normal in size. Right Atrium: Right atrial size was normal in size. Pericardium: There is no evidence of pericardial effusion. Mitral Valve: The mitral valve is grossly normal. No evidence of mitral valve regurgitation. Tricuspid Valve: The tricuspid valve is grossly normal. Tricuspid valve regurgitation is trivial. Aortic Valve: The aortic valve is tricuspid. Aortic valve regurgitation is mild. Mild aortic valve annular calcification. Pulmonic Valve: The pulmonic valve was grossly normal. Pulmonic valve regurgitation is trivial. Aorta: The aortic root is normal in size and structure. Venous: The inferior vena cava is normal in size with greater than 50% respiratory variability, suggesting right atrial pressure of 3 mmHg. IAS/Shunts: No atrial level shunt detected by color flow Doppler.  LEFT VENTRICLE PLAX 2D LVIDd:         3.80 cm  Diastology LVIDs:         2.60 cm  LV e' lateral:   11.00  cm/s LV PW:         1.10 cm  LV E/e' lateral: 8.3 LV IVS:        1.20 cm  LV e' medial:    12.70 cm/s LVOT diam:     2.20 cm  LV E/e' medial:  7.2 LV SV:         90 LV SV Index:   46 LVOT Area:     3.80 cm  RIGHT VENTRICLE RV Basal diam:  3.50 cm RV S prime:     13.90 cm/s TAPSE (M-mode): 3.0 cm LEFT ATRIUM           Index       RIGHT ATRIUM           Index LA diam:      2.80 cm 1.45 cm/m  RA Area:     14.00 cm LA Vol (A2C): 66.0 ml 34.12 ml/m RA Volume:   34.10 ml  17.63 ml/m LA Vol (A4C): 26.9 ml 13.91 ml/m  AORTIC VALVE LVOT Vmax:   107.00 cm/s LVOT Vmean:  68.800 cm/s LVOT VTI:    0.236 m  AORTA Ao Root diam: 3.20 cm MITRAL VALVE               TRICUSPID VALVE MV Area (PHT): 3.03 cm    TR Peak grad:   32.9  mmHg MV Decel Time: 250 msec    TR Vmax:        287.00 cm/s MV E velocity: 91.20 cm/s MV A velocity: 73.20 cm/s  SHUNTS MV E/A ratio:  1.25        Systemic VTI:  0.24 m                            Systemic Diam: 2.20 cm Rozann Lesches MD Electronically signed by Rozann Lesches MD Signature Date/Time: 06/02/2020/2:46:38 PM    Final     Review of Systems  Constitutional: Positive for fever.  HENT: Negative.   Eyes: Negative.   Respiratory: Negative.   Cardiovascular: Positive for chest pain.  Gastrointestinal: Positive for abdominal pain, nausea and vomiting.  Endocrine: Negative.   Genitourinary: Positive for difficulty urinating.  Musculoskeletal: Negative.   Skin: Negative.   Allergic/Immunologic: Negative.   Neurological: Negative.   Hematological: Negative.   Psychiatric/Behavioral: Negative.   All other systems reviewed and are negative.  Blood pressure 138/69, pulse 95, temperature (!) 100.9 F (38.3 C), temperature source Oral, resp. rate 20, height 5\' 9"  (1.753 m), weight 77.1 kg, SpO2 90 %. Physical Exam Constitutional:      General: He is not in acute distress.    Appearance: He is well-developed. He is not ill-appearing.  HENT:     Head: Normocephalic and atraumatic.      Right Ear: External ear normal.     Left Ear: External ear normal.     Nose: Nose normal.     Mouth/Throat:     Mouth: Mucous membranes are moist.     Pharynx: Oropharynx is clear.  Eyes:     General: No scleral icterus.       Right eye: No discharge.        Left eye: No discharge.     Extraocular Movements: Extraocular movements intact.     Conjunctiva/sclera: Conjunctivae normal.     Pupils: Pupils are equal, round, and reactive to light.  Neck:     Thyroid: No thyromegaly.     Trachea: No tracheal deviation.  Cardiovascular:     Rate and Rhythm: Normal rate and regular rhythm.     Pulses: Normal pulses.  Pulmonary:     Effort: Pulmonary effort is normal.  Chest:     Chest wall: No tenderness.  Abdominal:     General: Abdomen is flat. There is no distension.     Palpations: Abdomen is soft.     Tenderness: There is abdominal tenderness (mild tenderness). There is no rebound. Guarding: voluntary guarding.  Musculoskeletal:        General: No swelling or tenderness. Normal range of motion.     Cervical back: Neck supple.  Skin:    General: Skin is warm and dry.     Capillary Refill: Capillary refill takes 2 to 3 seconds.     Coloration: Skin is not jaundiced or pale.     Findings: No bruising or erythema.  Neurological:     General: No focal deficit present.     Mental Status: He is alert and oriented to person, place, and time. Mental status is at baseline.  Psychiatric:        Mood and Affect: Mood normal.        Behavior: Behavior normal.        Thought Content: Thought content normal.        Judgment: Judgment normal.  Assessment/Plan: Cholelithiasis Likely acute cholecystitis  IV fluids IV antibiotics Cards following, will see what risk stratification is.  Likely will proceed with lap chole in next 1-2 days vs perc chole tube if risk assessment is too high.    Hold on fatty foods. Npo after midnight in case of intervention tomorrow.    Will need  heparin gtt held at least 4 hours pre op.    Stark Klein 06/03/2020, 5:11 PM

## 2020-06-03 NOTE — Progress Notes (Signed)
Progress Note  Patient Name: Phillip Kirk Date of Encounter: 06/03/2020  Primary Cardiologist:  New  Subjective   No chest pain but complaining of constipation and abdominal discomfort this morning. Yesterday had an episode of chills and a low-grade fever, blood cultures reportedly sent overnight. No nausea or emesis.  Inpatient Medications    Scheduled Meds: . amLODipine  5 mg Oral Daily  . aspirin EC  81 mg Oral Daily  . atorvastatin  80 mg Oral Daily  . metoprolol tartrate  12.5 mg Oral BID  . montelukast  10 mg Oral Daily  . nitroGLYCERIN  1 inch Topical Q6H  . pantoprazole  40 mg Oral Daily  . sodium chloride flush  3 mL Intravenous Q12H  . sodium chloride flush  3 mL Intravenous Q12H  . tamsulosin  0.4 mg Oral QPC breakfast  . zolpidem  5 mg Oral QHS   Continuous Infusions: . sodium chloride    . heparin 1,500 Units/hr (06/03/20 0923)   PRN Meds: sodium chloride, bisacodyl, morphine injection, nitroGLYCERIN, ondansetron **OR** ondansetron (ZOFRAN) IV, polyethylene glycol, sodium chloride flush   Vital Signs    Vitals:   06/03/20 0030 06/03/20 0400 06/03/20 0735 06/03/20 0900  BP: 113/65 (!) 109/44 140/60   Pulse: 74 76 72 67  Resp: (!) 23 20 20 20   Temp: 98.5 F (36.9 C) 98.4 F (36.9 C) 99 F (37.2 C) 98.8 F (37.1 C)  TempSrc: Oral Oral Oral Oral  SpO2: 93% 91% 94% 93%  Weight:  77.1 kg    Height:        Intake/Output Summary (Last 24 hours) at 06/03/2020 0937 Last data filed at 06/03/2020 0751 Gross per 24 hour  Intake 580 ml  Output 1000 ml  Net -420 ml   Filed Weights   06/01/20 1928 06/02/20 0535 06/03/20 0400  Weight: 77.1 kg 77.7 kg 77.1 kg    Telemetry   Sinus rhythm. Personally reviewed.  ECG    An ECG dated 06/02/2020 was personally reviewed today and demonstrated:  Normal sinus rhythm.  Physical Exam   GEN: No acute distress.   Neck: No JVD. Cardiac: RRR, no murmur or gallop.  Respiratory: Nonlabored. Clear to auscultation  bilaterally. GI: Soft, bowel sounds present. MS: No edema; No deformity. Neuro:  Nonfocal. Psych: Alert and oriented x 3. Normal affect.  Labs    Chemistry Recent Labs  Lab 06/01/20 1950  NA 138  K 3.8  CL 100  CO2 26  GLUCOSE 170*  BUN 16  CREATININE 0.95  CALCIUM 9.4  GFRNONAA >60  GFRAA >60  ANIONGAP 12     Hematology Recent Labs  Lab 06/01/20 1950 06/02/20 1446 06/03/20 0115  WBC 15.6* 13.6* 18.3*  RBC 5.38 4.86 4.80  HGB 15.8 14.0 13.8  HCT 48.0 42.5 42.1  MCV 89.2 87.4 87.7  MCH 29.4 28.8 28.8  MCHC 32.9 32.9 32.8  RDW 13.0 13.0 13.0  PLT 258 232 225    Cardiac Enzymes Recent Labs  Lab 06/01/20 1950 06/01/20 2139 06/02/20 0201 06/02/20 0710  TROPONINIHS 256* 244* 241* 263*    Radiology    DG Chest 2 View  Result Date: 06/01/2020 CLINICAL DATA:  Chest pain EXAM: CHEST - 2 VIEW COMPARISON:  08/16/2019 FINDINGS: The heart size and mediastinal contours are within normal limits. Both lungs are clear. The visualized skeletal structures are unremarkable. IMPRESSION: No active cardiopulmonary disease. Electronically Signed   By: Rolm Baptise M.D.   On: 06/01/2020 21:00  ECHOCARDIOGRAM COMPLETE  Result Date: 06/02/2020    ECHOCARDIOGRAM REPORT   Patient Name:   Phillip Kirk Date of Exam: 06/02/2020 Medical Rec #:  [id number]     Height:       69.0 in Accession #:    [id number]    Weight:       171.3 lb Date of Birth:  December 30, 1950      BSA:          1.934 m Patient Age:    69 years      BP:           143/76 mmHg Patient Gender: M             HR:           77 bpm. Exam Location:  Inpatient Procedure: 2D Echo, Cardiac Doppler, Color Doppler and Intracardiac            Opacification Agent Indications:    NSTEMI  History:        Patient has no prior history of Echocardiogram examinations.                 Acute MI, Signs/Symptoms:Chest Pain and Shortness of Breath;                 Risk Factors:Hypertension and Former Smoker. ETOH abuse.  Sonographer:    Dustin Flock Referring Phys: [id number] [place]  1. Left ventricular ejection fraction, by estimation, is 55 to 60%. The left ventricle has normal function. The left ventricle has no regional wall motion abnormalities. There is mild left ventricular hypertrophy. Left ventricular diastolic parameters were normal.  2. Right ventricular systolic function is normal. The right ventricular size is normal. There is normal pulmonary artery systolic pressure. The estimated right ventricular systolic pressure is 05.3 mmHg.  3. The mitral valve is grossly normal. No evidence of mitral valve regurgitation.  4. The aortic valve is tricuspid. Aortic valve regurgitation is mild.  5. The inferior vena cava is normal in size with greater than 50% respiratory variability, suggesting right atrial pressure of 3 mmHg. FINDINGS  Left Ventricle: Left ventricular ejection fraction, by estimation, is 55 to 60%. The left ventricle has normal function. The left ventricle has no regional wall motion abnormalities. Definity contrast agent was given IV to delineate the left ventricular  endocardial borders. The left ventricular internal cavity size was normal in size. There is mild left ventricular hypertrophy. Left ventricular diastolic parameters were normal. Right Ventricle: The right ventricular size is normal. No increase in right ventricular wall thickness. Right ventricular systolic function is normal. There is normal pulmonary artery systolic pressure. The tricuspid regurgitant velocity is 2.87 m/s, and  with an assumed right atrial pressure of 3 mmHg, the estimated right ventricular systolic pressure is 97.6 mmHg. Left Atrium: Left atrial size was normal in size. Right Atrium: Right atrial size was normal in size. Pericardium: There is no evidence of pericardial effusion. Mitral Valve: The mitral valve is grossly normal. No evidence of mitral valve regurgitation. Tricuspid Valve: The tricuspid valve is grossly normal.  Tricuspid valve regurgitation is trivial. Aortic Valve: The aortic valve is tricuspid. Aortic valve regurgitation is mild. Mild aortic valve annular calcification. Pulmonic Valve: The pulmonic valve was grossly normal. Pulmonic valve regurgitation is trivial. Aorta: The aortic root is normal in size and structure. Venous: The inferior vena cava is normal in size with greater than 50% respiratory variability, suggesting right atrial pressure of 3 mmHg. IAS/Shunts: No  atrial level shunt detected by color flow Doppler.  LEFT VENTRICLE PLAX 2D LVIDd:         3.80 cm  Diastology LVIDs:         2.60 cm  LV e' lateral:   11.00 cm/s LV PW:         1.10 cm  LV E/e' lateral: 8.3 LV IVS:        1.20 cm  LV e' medial:    12.70 cm/s LVOT diam:     2.20 cm  LV E/e' medial:  7.2 LV SV:         90 LV SV Index:   46 LVOT Area:     3.80 cm  RIGHT VENTRICLE RV Basal diam:  3.50 cm RV S prime:     13.90 cm/s TAPSE (M-mode): 3.0 cm LEFT ATRIUM           Index       RIGHT ATRIUM           Index LA diam:      2.80 cm 1.45 cm/m  RA Area:     14.00 cm LA Vol (A2C): 66.0 ml 34.12 ml/m RA Volume:   34.10 ml  17.63 ml/m LA Vol (A4C): 26.9 ml 13.91 ml/m  AORTIC VALVE LVOT Vmax:   107.00 cm/s LVOT Vmean:  68.800 cm/s LVOT VTI:    0.236 m  AORTA Ao Root diam: 3.20 cm MITRAL VALVE               TRICUSPID VALVE MV Area (PHT): 3.03 cm    TR Peak grad:   32.9 mmHg MV Decel Time: 250 msec    TR Vmax:        287.00 cm/s MV E velocity: 91.20 cm/s MV A velocity: 73.20 cm/s  SHUNTS MV E/A ratio:  1.25        Systemic VTI:  0.24 m                            Systemic Diam: 2.20 cm Rozann Lesches MD Electronically signed by Rozann Lesches MD Signature Date/Time: 06/02/2020/2:46:38 PM    Final     Patient Profile     69 y.o. male with a history of hypertension, GERD, BPH, recurrent UTI, and previous urosepsis/bacteremia in April at which point he had demand ischemia by cardiac enzymes. Previous Myoview in 2019 revealed mild inferior ischemia that  was managed medically. He presents now with recent chest and abdominal discomfort, cardiac enzymes consistent with demand ischemia, and ultimately plan for diagnostic cardiac catheterization.  Assessment & Plan    1. Demand ischemia, likely type II NSTEMI with peak high-sensitivity troponin I of 263. ECG shows no acute ST segment changes. Patient reports symptoms starting an hour and a half after a meal, also nausea and emesis. Echocardiogram yesterday shows normal LVEF at 55 to 60% without regional wall motion abnormalities.  2. Recurrent abdominal pain. Patient reportedly with low-grade temperature last night (not recorded) and blood cultures ordered by covering provider, I asked nursing to clarify whether these were drawn. He does have a history of suspected urosepsis/bacteremia in April with history of UTIs. Leukocytosis noted with WBC 13.6 up to 18.3.  3. Essential hypertension, continues on Norvasc and Lopressor.  4. BPH.  Discussed with patient and wife in room today. She states that she would like to stay with him to assist, normally this would be fine however not in the setting  of current COVID-19 restrictions. With present symptoms and reported low-grade fever as well as increasing leukocytosis, cardiac catheterization will be put on hold pending further work-up. Plan to send urinalysis, asked nursing to verify that blood cultures were sent. He may need an abdominal and pelvic CT if symptoms continue. He is currently on the hospitalist team, would appreciate their ongoing work-up in his care.  Signed, Rozann Lesches, MD  06/03/2020, 9:37 AM

## 2020-06-03 NOTE — Progress Notes (Signed)
ANTICOAGULATION CONSULT NOTE  Pharmacy Consult for Heparin Indication: chest pain/ACS  No Known Allergies  Patient Measurements: Height: 5\' 9"  (175.3 cm) Weight: 77.1 kg (169 lb 15.6 oz) IBW/kg (Calculated) : 70.7 Heparin Dosing Weight: HEPARIN DW (KG): 77.7  Vital Signs: Temp: 100.9 F (38.3 C) (08/29 1657) Temp Source: Oral (08/29 1657) BP: 138/69 (08/29 1553) Pulse Rate: 95 (08/29 1553)  Labs: Recent Labs     0000 06/01/20 1950 06/01/20 1950 06/01/20 2139 06/02/20 0201 06/02/20 0710 06/02/20 0710 06/02/20 1446 06/03/20 0115 06/03/20 1617  HGB   < > 15.8  --   --   --   --   --  14.0 13.8  --   HCT  --  48.0  --   --   --   --   --  42.5 42.1  --   PLT  --  258  --   --   --   --   --  232 225  --   APTT  --  27  --   --   --   --   --   --   --   --   LABPROT  --  12.4  --   --   --   --   --   --   --   --   INR  --  1.0  --   --   --   --   --   --   --   --   HEPARINUNFRC  --   --   --   --   --  0.30   < > 0.26* 0.21* <0.10*  CREATININE  --  0.95  --   --   --   --   --   --   --   --   TROPONINIHS  --  256*   < > 244* 241* 263*  --   --   --   --    < > = values in this interval not displayed.    Estimated Creatinine Clearance: 73.4 mL/min (by C-G formula based on SCr of 0.95 mg/dL).   Medical History: Past Medical History:  Diagnosis Date  . Abnormal stress test    a. 11/2017 MV: EF 69%, HTN response. Ex time 7:13. Mild inf ischemia-->low risk.  . Alcohol abuse   . BPH (benign prostatic hyperplasia)   . Demand ischemia ([place])    a. 01/2020 in setting of urosepsis/bacteremia-->Echo: EF >55%, mild MR/TR. No evidence of veg.  [name] Dysphagia 07/24/2017  . Essential hypertension, benign 07/24/2017  . GERD (gastroesophageal reflux disease)   . History of bacteremia    a. 01/2020 E. coli and Streptococcus gallolyticus bacteremia in setting of urosepsis.  [name] History of tobacco abuse    a. Quit 1993.  [name] Recurrent UTI    a. 01/2020 Urosepsis and bacteremia     Medications:  Medications Prior to Admission  Medication Sig Dispense Refill Last Dose  . acetaminophen (TYLENOL) 500 MG tablet Take 500-1,000 mg by mouth every 6 (six) hours as needed for moderate pain or headache.    Past Week at Unknown time  . amLODipine (NORVASC) 5 MG tablet Take 5 mg by mouth daily.    06/01/2020 at Unknown time  . aspirin EC 81 MG tablet Take 1 tablet (81 mg total) by mouth daily.   06/01/2020 at 0800  . metoprolol tartrate (LOPRESSOR) 25 MG tablet Take 12.5 mg by mouth in the  morning and at bedtime.   06/01/2020 at 1300  . montelukast (SINGULAIR) 10 MG tablet Take 1 tablet (10 mg total) by mouth daily. 90 tablet 3 05/31/2020 at Unknown time  . omeprazole (PRILOSEC) 40 MG capsule Take 40 mg by mouth daily.   06/01/2020 at Unknown time  . tamsulosin (FLOMAX) 0.4 MG CAPS capsule Take 0.4 mg by mouth daily after breakfast.   06/01/2020 at Unknown time  . triamcinolone cream (KENALOG) 0.1 % Apply 1 application topically 3 (three) times daily as needed (psoriasis).    Past Month at Unknown time  . zolpidem (AMBIEN) 5 MG tablet Take 5 mg by mouth daily.    05/31/2020 at Unknown time    Assessment: 69 yo M presented to ED with CP.  To start heparin.  No blood thinners PTA noted.  Heparin level this afternoon came back undetectable, on 1500 units/hr. Drawn correctly from opposite side. Redraw heparin level came back at 0.1. Hgb 13.8, plt 225 this morning. No s/sx of bleeding or infusion issues per nursing.   CT finding likely acute cholecystitis - poss procedure planned for 8/30 >> heparin will need to be off at least 4 hours prior (no currently on OR schedule).   Goal of Therapy:  Heparin level 0.3-0.7 units/ml Monitor platelets by anticoagulation protocol: Yes   Plan:  Increase IV heparin to 1700 units/hr. Recheck heparin level in 8 hrs. Daily heparin level and CBC. F/u plans for cath vs surgery on Monday.  Antonietta Jewel, PharmD, [place] Clinical Pharmacist  Phone:  [phone] 06/03/2020 6:03 PM  Please check AMION for all [place] phone numbers After 10:00 PM, call [facility] [phone]

## 2020-06-03 NOTE — Progress Notes (Addendum)
Progress Note    Phillip Kirk  DQQ:[id number] DOB: 1951-09-18  DOA: 06/01/2020 PCP: Lavella Lemons, PA    Brief Narrative:     Medical records reviewed and are as summarized below:  Phillip Kirk is an 69 y.o. male with medical history significant of hypertension, GERD, BPH who presented to the ER with chest pain.  Now developed right flank pain and chills.  Assessment/Plan:   Principal Problem:   NSTEMI (non-ST elevated myocardial infarction) ([place]) Active Problems:   Essential hypertension, benign   GERD (gastroesophageal reflux disease)   BPH (benign prostatic hyperplasia)   Chest pain, likely secondary to acute type 2 NSTEMI -peak high-sensitivity troponin I of 263. ECG shows no acute ST segment changes.  - Echocardiogram shows normal LVEF at 55 to 60% without regional wall motion abnormalities. -may need heart cath pending work up for infection -cardiology consult appreciated   Right flank pain/fever reported and chills and leukocytosis -check urine with culture -check blood cultures -check LFTs -CT abdomen pelvis: shows acute cholecystitis: will start IV abx, general surgery consult to start -will ask cardiology to weigh in on cardiac risk for surgery  Hypertension -Continue amlodipine, metoprolol  Gastroesophageal flux disease -Continue Meprazole  Benign prostatic hypertrophy -Continue tamsulosin  Hyperglycemia -? Related to infection -check SSI for now -HgbA1c in AM   Family Communication/Anticipated D/C date and plan/Code Status   DVT prophylaxis: heparin Code Status: Full Code.  Family Communication: wife at bedside Disposition Plan: Status is: Inpatient  Remains inpatient appropriate because:Hemodynamically unstable   Dispo: The patient is from: Home              Anticipated d/c is to: Home              Anticipated d/c date is: 2 days- 3 days              Patient currently is not medically stable to d/c.         Medical  Consultants:    cards   Subjective:   Right flank pain worse with movement, no cough  Objective:    Vitals:   06/03/20 0030 06/03/20 0400 06/03/20 0735 06/03/20 0900  BP: 113/65 (!) 109/44 140/60   Pulse: 74 76 72 67  Resp: (!) 23 20 20 20   Temp: 98.5 F (36.9 C) 98.4 F (36.9 C) 99 F (37.2 C) 98.8 F (37.1 C)  TempSrc: Oral Oral Oral Oral  SpO2: 93% 91% 94% 93%  Weight:  77.1 kg    Height:        Intake/Output Summary (Last 24 hours) at 06/03/2020 0956 Last data filed at 06/03/2020 0751 Gross per 24 hour  Intake 580 ml  Output 1000 ml  Net -420 ml   Filed Weights   06/01/20 1928 06/02/20 0535 06/03/20 0400  Weight: 77.1 kg 77.7 kg 77.1 kg    Exam:  General: Appearance:     Overweight male in no acute distress  GI No RUQ tenderness but some R CVA tenderness  Lungs:     respirations unlabored  Heart:    Normal heart rate. Normal rhythm. No murmurs, rubs, or gallops.   MS:   All extremities are intact.   Neurologic:   Awake, alert, oriented x 3.     Data Reviewed:   I have personally reviewed following labs and imaging studies:  Labs: Labs show the following:   Basic Metabolic Panel: Recent Labs  Lab 06/01/20 1950  NA 138  K  3.8  CL 100  CO2 26  GLUCOSE 170*  BUN 16  CREATININE 0.95  CALCIUM 9.4   GFR Estimated Creatinine Clearance: 73.4 mL/min (by C-G formula based on SCr of 0.95 mg/dL). Liver Function Tests: No results for input(s): AST, ALT, ALKPHOS, BILITOT, PROT, ALBUMIN in the last 168 hours. No results for input(s): LIPASE, AMYLASE in the last 168 hours. No results for input(s): AMMONIA in the last 168 hours. Coagulation profile Recent Labs  Lab 06/01/20 1950  INR 1.0    CBC: Recent Labs  Lab 06/01/20 1950 06/02/20 1446 06/03/20 0115  WBC 15.6* 13.6* 18.3*  HGB 15.8 14.0 13.8  HCT 48.0 42.5 42.1  MCV 89.2 87.4 87.7  PLT 258 232 225   Cardiac Enzymes: No results for input(s): CKTOTAL, CKMB, CKMBINDEX, TROPONINI in  the last 168 hours. BNP (last 3 results) No results for input(s): PROBNP in the last 8760 hours. CBG: No results for input(s): GLUCAP in the last 168 hours. D-Dimer: No results for input(s): DDIMER in the last 72 hours. Hgb A1c: No results for input(s): HGBA1C in the last 72 hours. Lipid Profile: No results for input(s): CHOL, HDL, LDLCALC, TRIG, CHOLHDL, LDLDIRECT in the last 72 hours. Thyroid function studies: No results for input(s): TSH, T4TOTAL, T3FREE, THYROIDAB in the last 72 hours.  Invalid input(s): FREET3 Anemia work up: No results for input(s): VITAMINB12, FOLATE, FERRITIN, TIBC, IRON, RETICCTPCT in the last 72 hours. Sepsis Labs: Recent Labs  Lab 06/01/20 1950 06/02/20 1446 06/03/20 0115  WBC 15.6* 13.6* 18.3*    Microbiology Recent Results (from the past 240 hour(s))  SARS Coronavirus 2 by RT PCR (hospital order, performed in [facility] hospital lab) Nasopharyngeal Nasopharyngeal Swab     Status: None   Collection Time: 06/01/20  9:55 PM   Specimen: Nasopharyngeal Swab  Result Value Ref Range Status   SARS Coronavirus 2 NEGATIVE NEGATIVE Final    Comment: (NOTE) SARS-CoV-2 target nucleic acids are NOT DETECTED.  The SARS-CoV-2 RNA is generally detectable in upper and lower respiratory specimens during the acute phase of infection. The lowest concentration of SARS-CoV-2 viral copies this assay can detect is 250 copies / mL. A negative result does not preclude SARS-CoV-2 infection and should not be used as the sole basis for treatment or other patient management decisions.  A negative result may occur with improper specimen collection / handling, submission of specimen other than nasopharyngeal swab, presence of viral mutation(s) within the areas targeted by this assay, and inadequate number of viral copies (<250 copies / mL). A negative result must be combined with clinical observations, patient history, and epidemiological information.  Fact Sheet for  Patients:   StrictlyIdeas.no  Fact Sheet for Healthcare Providers: BankingDealers.co.za  This test is not yet approved or  cleared by the [place] FDA and has been authorized for detection and/or diagnosis of SARS-CoV-2 by FDA under an Emergency Use Authorization (EUA).  This EUA will remain in effect (meaning this test can be used) for the duration of the COVID-19 declaration under Section 564(b)(1) of the Act, 21 U.S.C. section 360bbb-3(b)(1), unless the authorization is terminated or revoked sooner.  Performed at [facility], [address], [place],  [address]   MRSA PCR Screening     Status: None   Collection Time: 06/02/20  5:50 AM   Specimen: Nasal Mucosa; Nasopharyngeal  Result Value Ref Range Status   MRSA by PCR NEGATIVE NEGATIVE Final    Comment:        The  GeneXpert MRSA Assay (FDA approved for NASAL specimens only), is one component of a comprehensive MRSA colonization surveillance program. It is not intended to diagnose MRSA infection nor to guide or monitor treatment for MRSA infections. Performed at [facility], [place] [address] [place], [place] [address]     Procedures and diagnostic studies:  DG Chest 2 View  Result Date: 06/01/2020 CLINICAL DATA:  Chest pain EXAM: CHEST - 2 VIEW COMPARISON:  08/16/2019 FINDINGS: The heart size and mediastinal contours are within normal limits. Both lungs are clear. The visualized skeletal structures are unremarkable. IMPRESSION: No active cardiopulmonary disease. Electronically Signed   By: Rolm Baptise M.D.   On: 06/01/2020 21:00   ECHOCARDIOGRAM COMPLETE  Result Date: 06/02/2020    ECHOCARDIOGRAM REPORT   Patient Name:   [facility] Date of Exam: 06/02/2020 Medical Rec #:  [id number]     Height:       69.0 in Accession #:    [id number]    Weight:       171.3 lb Date of Birth:  1951-04-01      BSA:          1.934 m Patient Age:    3 years      BP:            143/76 mmHg Patient Gender: M             HR:           77 bpm. Exam Location:  Inpatient Procedure: 2D Echo, Cardiac Doppler, Color Doppler and Intracardiac            Opacification Agent Indications:    NSTEMI  History:        Patient has no prior history of Echocardiogram examinations.                 Acute MI, Signs/Symptoms:Chest Pain and Shortness of Breath;                 Risk Factors:Hypertension and Former Smoker. ETOH abuse.  Sonographer:    Dustin Flock Referring Phys: [id number] [place]  1. Left ventricular ejection fraction, by estimation, is 55 to 60%. The left ventricle has normal function. The left ventricle has no regional wall motion abnormalities. There is mild left ventricular hypertrophy. Left ventricular diastolic parameters were normal.  2. Right ventricular systolic function is normal. The right ventricular size is normal. There is normal pulmonary artery systolic pressure. The estimated right ventricular systolic pressure is 39.7 mmHg.  3. The mitral valve is grossly normal. No evidence of mitral valve regurgitation.  4. The aortic valve is tricuspid. Aortic valve regurgitation is mild.  5. The inferior vena cava is normal in size with greater than 50% respiratory variability, suggesting right atrial pressure of 3 mmHg. FINDINGS  Left Ventricle: Left ventricular ejection fraction, by estimation, is 55 to 60%. The left ventricle has normal function. The left ventricle has no regional wall motion abnormalities. Definity contrast agent was given IV to delineate the left ventricular  endocardial borders. The left ventricular internal cavity size was normal in size. There is mild left ventricular hypertrophy. Left ventricular diastolic parameters were normal. Right Ventricle: The right ventricular size is normal. No increase in right ventricular wall thickness. Right ventricular systolic function is normal. There is normal pulmonary artery systolic pressure. The tricuspid  regurgitant velocity is 2.87 m/s, and  with an assumed right atrial pressure of 3 mmHg, the estimated right ventricular systolic  pressure is 35.9 mmHg. Left Atrium: Left atrial size was normal in size. Right Atrium: Right atrial size was normal in size. Pericardium: There is no evidence of pericardial effusion. Mitral Valve: The mitral valve is grossly normal. No evidence of mitral valve regurgitation. Tricuspid Valve: The tricuspid valve is grossly normal. Tricuspid valve regurgitation is trivial. Aortic Valve: The aortic valve is tricuspid. Aortic valve regurgitation is mild. Mild aortic valve annular calcification. Pulmonic Valve: The pulmonic valve was grossly normal. Pulmonic valve regurgitation is trivial. Aorta: The aortic root is normal in size and structure. Venous: The inferior vena cava is normal in size with greater than 50% respiratory variability, suggesting right atrial pressure of 3 mmHg. IAS/Shunts: No atrial level shunt detected by color flow Doppler.  LEFT VENTRICLE PLAX 2D LVIDd:         3.80 cm  Diastology LVIDs:         2.60 cm  LV e' lateral:   11.00 cm/s LV PW:         1.10 cm  LV E/e' lateral: 8.3 LV IVS:        1.20 cm  LV e' medial:    12.70 cm/s LVOT diam:     2.20 cm  LV E/e' medial:  7.2 LV SV:         90 LV SV Index:   46 LVOT Area:     3.80 cm  RIGHT VENTRICLE RV Basal diam:  3.50 cm RV S prime:     13.90 cm/s TAPSE (M-mode): 3.0 cm LEFT ATRIUM           Index       RIGHT ATRIUM           Index LA diam:      2.80 cm 1.45 cm/m  RA Area:     14.00 cm LA Vol (A2C): 66.0 ml 34.12 ml/m RA Volume:   34.10 ml  17.63 ml/m LA Vol (A4C): 26.9 ml 13.91 ml/m  AORTIC VALVE LVOT Vmax:   107.00 cm/s LVOT Vmean:  68.800 cm/s LVOT VTI:    0.236 m  AORTA Ao Root diam: 3.20 cm MITRAL VALVE               TRICUSPID VALVE MV Area (PHT): 3.03 cm    TR Peak grad:   32.9 mmHg MV Decel Time: 250 msec    TR Vmax:        287.00 cm/s MV E velocity: 91.20 cm/s MV A velocity: 73.20 cm/s  SHUNTS MV E/A ratio:   1.25        Systemic VTI:  0.24 m                            Systemic Diam: 2.20 cm Rozann Lesches MD Electronically signed by Rozann Lesches MD Signature Date/Time: 06/02/2020/2:46:38 PM    Final     Medications:   . amLODipine  5 mg Oral Daily  . aspirin EC  81 mg Oral Daily  . atorvastatin  80 mg Oral Daily  . metoprolol tartrate  12.5 mg Oral BID  . montelukast  10 mg Oral Daily  . nitroGLYCERIN  1 inch Topical Q6H  . pantoprazole  40 mg Oral Daily  . sodium chloride flush  3 mL Intravenous Q12H  . sodium chloride flush  3 mL Intravenous Q12H  . tamsulosin  0.4 mg Oral QPC breakfast  . zolpidem  5 mg Oral QHS  Continuous Infusions: . sodium chloride    . heparin 1,500 Units/hr (06/03/20 0923)     LOS: 2 days   Geradine Girt  [facility]   How to contact the [facility] Attending or Consulting provider Scott or covering provider during after hours Joanna, for this patient?  1. Check the care team in [facility] and look for a) attending/consulting [facility] provider listed and b) the [facility] team listed 2. Log into [email or web address] and use [place]'s universal password to access. If you do not have the password, please contact the hospital operator. 3. Locate the [facility] provider you are looking for under [facility] and page to a number that you can be directly reached. 4. If you still have difficulty reaching the provider, please page the [facility] (Director on Call) for the Hospitalists listed on amion for assistance.  06/03/2020, 9:56 AM

## 2020-06-03 NOTE — Progress Notes (Signed)
Pharmacy Antibiotic Note  Phillip Kirk is a 69 y.o. male admitted on 06/01/2020 with intra-abdominal infection.  Pharmacy has been consulted for zosyn dosing.  WBC 18.3 this morning. Scr 0.95 (CrCl 73 mL/min). Tmax 101.2. CT ab showing cholelithiasis consistent with acute cholecystitis.   Plan: Zosyn 3.375g IV q8h (4 hour infusion).  Monitor renal fx, cx results, clinical pic, and if any surgery plans  Height: 5\' 9"  (175.3 cm) Weight: 77.1 kg (169 lb 15.6 oz) IBW/kg (Calculated) : 70.7  Temp (24hrs), Avg:99 F (37.2 C), Min:98.3 F (36.8 C), Max:101.2 F (38.4 C)  Recent Labs  Lab 06/01/20 1950 06/02/20 1446 06/03/20 0115  WBC 15.6* 13.6* 18.3*  CREATININE 0.95  --   --     Estimated Creatinine Clearance: 73.4 mL/min (by C-G formula based on SCr of 0.95 mg/dL).    No Known Allergies  Antimicrobials this admission: Zozyn 8/29 >>   Dose adjustments this admission: N/A  Microbiology results: 8/29 BCx: sent 8/29 UCx: sent  8/29 MRSA PCR: neg  Thank you for allowing pharmacy to be a part of this patient's care.  Antonietta Jewel, PharmD, [place] Clinical Pharmacist  Phone: [phone] 06/03/2020 4:29 PM  Please check AMION for all [facility] phone numbers After 10:00 PM, call [place] [phone]

## 2020-06-03 NOTE — Progress Notes (Signed)
ANTICOAGULATION CONSULT NOTE  Pharmacy Consult for Heparin Indication: chest pain/ACS  No Known Allergies  Patient Measurements: Height: 5\' 9"  (175.3 cm) Weight: 77.1 kg (169 lb 15.6 oz) IBW/kg (Calculated) : 70.7 Heparin Dosing Weight: HEPARIN DW (KG): 77.7  Vital Signs: Temp: 99 F (37.2 C) (08/29 0735) Temp Source: Oral (08/29 0735) BP: 140/60 (08/29 0735) Pulse Rate: 72 (08/29 0735)  Labs: Recent Labs    06/01/20 1950 06/01/20 1950 06/01/20 2139 06/02/20 0201 06/02/20 0710 06/02/20 1446 06/03/20 0115  HGB 15.8   < >  --   --   --  14.0 13.8  HCT 48.0  --   --   --   --  42.5 42.1  PLT 258  --   --   --   --  232 225  APTT 27  --   --   --   --   --   --   LABPROT 12.4  --   --   --   --   --   --   INR 1.0  --   --   --   --   --   --   HEPARINUNFRC  --   --   --   --  0.30 0.26* 0.21*  CREATININE 0.95  --   --   --   --   --   --   TROPONINIHS 256*   < > 244* 241* 263*  --   --    < > = values in this interval not displayed.    Estimated Creatinine Clearance: 73.4 mL/min (by C-G formula based on SCr of 0.95 mg/dL).   Medical History: Past Medical History:  Diagnosis Date  . Abnormal stress test    a. 11/2017 MV: EF 69%, HTN response. Ex time 7:13. Mild inf ischemia-->low risk.  . Alcohol abuse   . BPH (benign prostatic hyperplasia)   . Demand ischemia ([place])    a. 01/2020 in setting of urosepsis/bacteremia-->Echo: EF >55%, mild MR/TR. No evidence of veg.  [name] Dysphagia 07/24/2017  . Essential hypertension, benign 07/24/2017  . GERD (gastroesophageal reflux disease)   . History of bacteremia    a. 01/2020 E. coli and Streptococcus gallolyticus bacteremia in setting of urosepsis.  [name] History of tobacco abuse    a. Quit 1993.  [name] Recurrent UTI    a. 01/2020 Urosepsis and bacteremia    Medications:  Medications Prior to Admission  Medication Sig Dispense Refill Last Dose  . acetaminophen (TYLENOL) 500 MG tablet Take 500-1,000 mg by mouth every 6 (six) hours  as needed for moderate pain or headache.    Past Week at Unknown time  . amLODipine (NORVASC) 5 MG tablet Take 5 mg by mouth daily.    06/01/2020 at Unknown time  . aspirin EC 81 MG tablet Take 1 tablet (81 mg total) by mouth daily.   06/01/2020 at 0800  . metoprolol tartrate (LOPRESSOR) 25 MG tablet Take 12.5 mg by mouth in the morning and at bedtime.   06/01/2020 at 1300  . montelukast (SINGULAIR) 10 MG tablet Take 1 tablet (10 mg total) by mouth daily. 90 tablet 3 05/31/2020 at Unknown time  . omeprazole (PRILOSEC) 40 MG capsule Take 40 mg by mouth daily.   06/01/2020 at Unknown time  . tamsulosin (FLOMAX) 0.4 MG CAPS capsule Take 0.4 mg by mouth daily after breakfast.   06/01/2020 at Unknown time  . triamcinolone cream (KENALOG) 0.1 % Apply 1 application topically 3 (three)  times daily as needed (psoriasis).    Past Month at Unknown time  . zolpidem (AMBIEN) 5 MG tablet Take 5 mg by mouth daily.    05/31/2020 at Unknown time    Assessment: 69 yo M presented to ED with CP.  To start heparin.  No blood thinners PTA noted.  Heparin level this afternoon came back slightly subtherapeutic at 0.21, on 1350 units/hr. Hgb 13.8, plt 225 this morning. No s/sx of bleeding or infusion issues per nursing. Drawn correctly.   Goal of Therapy:  Heparin level 0.3-0.7 units/ml Monitor platelets by anticoagulation protocol: Yes   Plan:  Increase IV heparin to 1500 units/hr to get into goal range. Recheck heparin level in 8 hrs. Daily heparin level and CBC. F/u plans for cath on Monday.  [place] Crane, Roylene Reason, BCCP Clinical Pharmacist  06/03/2020 7:41 AM   [facility] pharmacy phone numbers are listed on [email or web address]

## 2020-06-03 NOTE — Progress Notes (Signed)
Pt is aware of possible surgery tomorrow, and NPO from midnight. No any orders are there so far.pt's wife is in bed side and updated. IV heparin is continue @15  without any complication, IVABX, Zosyn started from this evening, fever is 100 after tylenol, pt is resting comfortably this moment, will continue to monitor the patient  Palma Holter, RN

## 2020-06-04 ENCOUNTER — Inpatient Hospital Stay (HOSPITAL_COMMUNITY): Payer: Medicare Other

## 2020-06-04 DIAGNOSIS — K810 Acute cholecystitis: Secondary | ICD-10-CM

## 2020-06-04 DIAGNOSIS — A419 Sepsis, unspecified organism: Secondary | ICD-10-CM

## 2020-06-04 DIAGNOSIS — R6520 Severe sepsis without septic shock: Secondary | ICD-10-CM

## 2020-06-04 DIAGNOSIS — R778 Other specified abnormalities of plasma proteins: Secondary | ICD-10-CM

## 2020-06-04 LAB — CBC
HCT: 42.4 % (ref 39.0–52.0)
Hemoglobin: 13.8 g/dL (ref 13.0–17.0)
MCH: 28.7 pg (ref 26.0–34.0)
MCHC: 32.5 g/dL (ref 30.0–36.0)
MCV: 88.1 fL (ref 80.0–100.0)
Platelets: 196 10*3/uL (ref 150–400)
RBC: 4.81 MIL/uL (ref 4.22–5.81)
RDW: 13 % (ref 11.5–15.5)
WBC: 16.5 10*3/uL — ABNORMAL HIGH (ref 4.0–10.5)
nRBC: 0 % (ref 0.0–0.2)

## 2020-06-04 LAB — BLOOD CULTURE ID PANEL (REFLEXED) - BCID2

## 2020-06-04 LAB — BASIC METABOLIC PANEL
Anion gap: 11 (ref 5–15)
BUN: 11 mg/dL (ref 8–23)
CO2: 29 mmol/L (ref 22–32)
Calcium: 9 mg/dL (ref 8.9–10.3)
Chloride: 94 mmol/L — ABNORMAL LOW (ref 98–111)
Creatinine, Ser: 1.41 mg/dL — ABNORMAL HIGH (ref 0.61–1.24)
GFR calc Af Amer: 58 mL/min — ABNORMAL LOW (ref >60)
GFR calc non Af Amer: 50 mL/min — ABNORMAL LOW (ref >60)
Glucose, Bld: 102 mg/dL — ABNORMAL HIGH (ref 70–99)
Potassium: 3.8 mmol/L (ref 3.5–5.1)
Sodium: 134 mmol/L — ABNORMAL LOW (ref 135–145)

## 2020-06-04 LAB — HEPARIN LEVEL (UNFRACTIONATED): Heparin Unfractionated: 0.28 IU/mL — ABNORMAL LOW (ref 0.30–0.70)

## 2020-06-04 LAB — HEMOGLOBIN A1C
Hgb A1c MFr Bld: 6 % — ABNORMAL HIGH (ref 4.8–5.6)
Mean Plasma Glucose: 125.5 mg/dL

## 2020-06-04 LAB — GLUCOSE, CAPILLARY
Glucose-Capillary: 98 mg/dL (ref 70–99)
Glucose-Capillary: 105 mg/dL — ABNORMAL HIGH (ref 70–99)
Glucose-Capillary: 96 mg/dL (ref 70–99)
Glucose-Capillary: 107 mg/dL — ABNORMAL HIGH (ref 70–99)

## 2020-06-04 MED ORDER — HEPARIN SODIUM (PORCINE) 5000 UNIT/ML IJ SOLN
5000.0000 [IU] | Freq: Three times a day (TID) | INTRAMUSCULAR | Status: DC
  Administered 2020-06-04 – 2020-06-05 (×2): 5000 [IU] via SUBCUTANEOUS
  Filled 2020-06-04 (×2): qty 1

## 2020-06-04 MED ORDER — SODIUM CHLORIDE 0.9 % IV SOLN: INTRAVENOUS | Status: DC

## 2020-06-04 MED ORDER — MORPHINE SULFATE (PF) 4 MG/ML IV SOLN
INTRAVENOUS | Status: AC
  Administered 2020-06-04: 3 mg via INTRAVENOUS
  Filled 2020-06-04: qty 1

## 2020-06-04 MED ORDER — SODIUM CHLORIDE 0.9 % IV SOLN
2.0000 g | INTRAVENOUS | Status: DC
  Administered 2020-06-04 – 2020-06-05 (×2): 2 g via INTRAVENOUS
  Filled 2020-06-04: qty 2
  Filled 2020-06-04: qty 20

## 2020-06-04 MED ORDER — MORPHINE SULFATE (PF) 4 MG/ML IV SOLN: 3.0000 mg | Freq: Once | INTRAVENOUS | Status: AC

## 2020-06-04 MED ORDER — METRONIDAZOLE IN NACL 5-0.79 MG/ML-% IV SOLN
500.0000 mg | Freq: Three times a day (TID) | INTRAVENOUS | Status: DC
  Administered 2020-06-04 – 2020-06-05 (×3): 500 mg via INTRAVENOUS
  Filled 2020-06-04 (×4): qty 100

## 2020-06-04 MED ORDER — TECHNETIUM TC 99M MEBROFENIN IV KIT
4.8000 | PACK | Freq: Once | INTRAVENOUS | Status: AC | PRN
  Administered 2020-06-04: 4.8 via INTRAVENOUS

## 2020-06-04 NOTE — Progress Notes (Signed)
Back from nuclear meds by bed awake and alert.

## 2020-06-04 NOTE — Progress Notes (Signed)
PHARMACY - PHYSICIAN COMMUNICATION CRITICAL VALUE ALERT - BLOOD CULTURE IDENTIFICATION (BCID)  Phillip Kirk is an 69 y.o. male who presented to [facility] on 06/01/2020 with a chief complaint of chest pain  Assessment:  4/4 bottles positive for strep species, likely not a contaminant, WBC elevated, Tmax 102.7  Name of physician (or Provider) Contacted: Dr. Myna Hidalgo  Current antibiotics: Zosyn  Changes to prescribed antibiotics recommended:  DC Zosyn Start Ceftriaxone/Flagyl  Results for orders placed or performed during the hospital encounter of 06/01/20  Blood Culture ID Panel (Reflexed) (Collected: 06/03/2020 10:38 AM)  Result Value Ref Range   Enterococcus faecalis NOT DETECTED NOT DETECTED   Enterococcus Faecium NOT DETECTED NOT DETECTED   Listeria monocytogenes NOT DETECTED NOT DETECTED   Staphylococcus species NOT DETECTED NOT DETECTED   Staphylococcus aureus (BCID) NOT DETECTED NOT DETECTED   Staphylococcus epidermidis NOT DETECTED NOT DETECTED   Staphylococcus lugdunensis NOT DETECTED NOT DETECTED   Streptococcus species DETECTED (A) NOT DETECTED   Streptococcus agalactiae NOT DETECTED NOT DETECTED   Streptococcus pneumoniae NOT DETECTED NOT DETECTED   Streptococcus pyogenes NOT DETECTED NOT DETECTED   A.calcoaceticus-baumannii NOT DETECTED NOT DETECTED   Bacteroides fragilis NOT DETECTED NOT DETECTED   Enterobacterales NOT DETECTED NOT DETECTED   Enterobacter cloacae complex NOT DETECTED NOT DETECTED   Escherichia coli NOT DETECTED NOT DETECTED   Klebsiella aerogenes NOT DETECTED NOT DETECTED   Klebsiella oxytoca NOT DETECTED NOT DETECTED   Klebsiella pneumoniae NOT DETECTED NOT DETECTED   Proteus species NOT DETECTED NOT DETECTED   Salmonella species NOT DETECTED NOT DETECTED   Serratia marcescens NOT DETECTED NOT DETECTED   Haemophilus influenzae NOT DETECTED NOT DETECTED   Neisseria meningitidis NOT DETECTED NOT DETECTED   Pseudomonas aeruginosa NOT DETECTED NOT  DETECTED   Stenotrophomonas maltophilia NOT DETECTED NOT DETECTED   Candida albicans NOT DETECTED NOT DETECTED   Candida auris NOT DETECTED NOT DETECTED   Candida glabrata NOT DETECTED NOT DETECTED   Candida krusei NOT DETECTED NOT DETECTED   Candida parapsilosis NOT DETECTED NOT DETECTED   Candida tropicalis NOT DETECTED NOT DETECTED   Cryptococcus neoformans/gattii NOT DETECTED NOT DETECTED    Narda Bonds 06/04/2020  2:55 AM

## 2020-06-04 NOTE — Progress Notes (Signed)
Progress Note  Patient Name: Phillip Kirk Date of Encounter: 06/04/2020  Primary Cardiologist:  Domenic Polite (Former Bronson Ing)  Subjective   No chest pain. Feels hot. Mild abd pain  Inpatient Medications    Scheduled Meds: . amLODipine  5 mg Oral Daily  . aspirin EC  81 mg Oral Daily  . atorvastatin  80 mg Oral Daily  . diclofenac Sodium  2 g Topical QID  . insulin aspart  0-6 Units Subcutaneous TID WC  . metoprolol tartrate  12.5 mg Oral BID  . montelukast  10 mg Oral Daily  . nitroGLYCERIN  1 inch Topical Q6H  . pantoprazole  40 mg Oral Daily  . sodium chloride flush  3 mL Intravenous Q12H  . sodium chloride flush  3 mL Intravenous Q12H  . tamsulosin  0.4 mg Oral QPC breakfast  . zolpidem  5 mg Oral QHS   Continuous Infusions: . sodium chloride    . sodium chloride 75 mL/hr at 06/04/20 0834  . cefTRIAXone (ROCEPHIN)  IV 2 g (06/04/20 0322)  . heparin 1,700 Units/hr (06/04/20 0328)  . metronidazole 500 mg (06/04/20 0653)   PRN Meds: sodium chloride, acetaminophen, bisacodyl, morphine injection, nitroGLYCERIN, ondansetron **OR** ondansetron (ZOFRAN) IV, polyethylene glycol, sodium chloride flush   Vital Signs    Vitals:   06/04/20 0321 06/04/20 0819 06/04/20 0821 06/04/20 0822  BP: 120/65 120/64    Pulse: 78 (!) 102 96 100  Resp: (!) 23 15 (!) 28 (!) 23  Temp: 98.5 F (36.9 C) (!) 101.6 F (38.7 C)    TempSrc: Oral Oral    SpO2: 93%   91%  Weight:      Height:        Intake/Output Summary (Last 24 hours) at 06/04/2020 0905 Last data filed at 06/04/2020 0500 Gross per 24 hour  Intake 0 ml  Output 800 ml  Net -800 ml   Filed Weights   06/01/20 1928 06/02/20 0535 06/03/20 0400  Weight: 77.1 kg 77.7 kg 77.1 kg    Telemetry   Sinus- Personally reviewed.  ECG    No AM EKG  Physical Exam   General: Well developed, well nourished, NAD  SKIN: warm, dry. No rashes. Neuro: No focal deficits  Musculoskeletal: Muscle strength 5/5 all ext    Psychiatric: Mood and affect normal  Neck: No JVD Lungs:Clear bilaterally, no wheezes, rhonci, crackles Cardiovascular: Regular rate and rhythm. No murmurs, gallops or rubs. Abdomen:Soft.  Extremities: No lower extremity edema.   Labs    Chemistry Recent Labs  Lab 06/01/20 1950 06/03/20 1033 06/04/20 0630  NA 138  --  134*  K 3.8  --  3.8  CL 100  --  94*  CO2 26  --  29  GLUCOSE 170*  --  102*  BUN 16  --  11  CREATININE 0.95  --  1.41*  CALCIUM 9.4  --  9.0  PROT  --  6.5  --   ALBUMIN  --  3.1*  --   AST  --  18  --   ALT  --  15  --   ALKPHOS  --  53  --   BILITOT  --  1.0  --   GFRNONAA >60  --  50*  GFRAA >60  --  58*  ANIONGAP 12  --  11     Hematology Recent Labs  Lab 06/02/20 1446 06/03/20 0115 06/04/20 0630  WBC 13.6* 18.3* 16.5*  RBC 4.86 4.80 4.81  HGB  14.0 13.8 13.8  HCT 42.5 42.1 42.4  MCV 87.4 87.7 88.1  MCH 28.8 28.8 28.7  MCHC 32.9 32.8 32.5  RDW 13.0 13.0 13.0  PLT 232 225 196    Cardiac Enzymes Recent Labs  Lab 06/01/20 1950 06/01/20 2139 06/02/20 0201 06/02/20 0710  TROPONINIHS 256* 244* 241* 263*    Radiology    CT ABDOMEN PELVIS W CONTRAST  Result Date: 06/03/2020 CLINICAL DATA:  Abdominal pain and fever. EXAM: CT ABDOMEN AND PELVIS WITH CONTRAST TECHNIQUE: Multidetector CT imaging of the abdomen and pelvis was performed using the standard protocol following bolus administration of intravenous contrast. CONTRAST:  180mL OMNIPAQUE IOHEXOL 300 MG/ML  SOLN COMPARISON:  November 28, 2019 FINDINGS: Lower chest: Mild atelectasis is seen within the posterior aspect of the bilateral lung bases. Hepatobiliary: There is diffuse fatty infiltration of the liver parenchyma. No focal liver abnormality is seen. A 1.0 cm gallstone is seen within the gallbladder lumen without evidence of gallbladder wall thickening or biliary dilatation. Mild pericholecystic inflammatory fat stranding is seen near the neck of the gallbladder. Pancreas:  Unremarkable. No pancreatic ductal dilatation or surrounding inflammatory changes. Spleen: Normal in size without focal abnormality. Adrenals/Urinary Tract: Adrenal glands are unremarkable. Kidneys are normal in size, without renal calculi or hydronephrosis. A 1.3 cm simple cyst is seen within the anterior aspect of the mid left kidney. Bladder is unremarkable. Stomach/Bowel: Stomach is within normal limits. Appendix appears normal. A 1.3 cm fat density lesion is seen within the lumen of the proximal duodenum. No evidence of bowel wall thickening, distention, or inflammatory changes. Noninflamed diverticula are seen within the proximal sigmoid colon. Vascular/Lymphatic: No significant vascular findings are present. No enlarged abdominal or pelvic lymph nodes. Reproductive: The prostate gland is markedly enlarged. Other: No abdominal wall hernia or abnormality. No abdominopelvic ascites. Musculoskeletal: No acute or significant osseous findings. IMPRESSION: 1. Cholelithiasis with additional findings consistent with acute cholecystitis. 2. Hepatic steatosis. 3. Sigmoid diverticulosis. 4. Markedly enlarged prostate gland. Electronically Signed   By: Virgina Norfolk M.D.   On: 06/03/2020 16:03   ECHOCARDIOGRAM COMPLETE  Result Date: 06/02/2020    ECHOCARDIOGRAM REPORT   Patient Name:   Phillip Kirk Date of Exam: 06/02/2020 Medical Rec #:  [id number]     Height:       69.0 in Accession #:    [id number]    Weight:       171.3 lb Date of Birth:  1951/02/03      BSA:          1.934 m Patient Age:    69 years      BP:           143/76 mmHg Patient Gender: M             HR:           77 bpm. Exam Location:  Inpatient Procedure: 2D Echo, Cardiac Doppler, Color Doppler and Intracardiac            Opacification Agent Indications:    NSTEMI  History:        Patient has no prior history of Echocardiogram examinations.                 Acute MI, Signs/Symptoms:Chest Pain and Shortness of Breath;                 Risk  Factors:Hypertension and Former Smoker. ETOH abuse.  Sonographer:    Dustin Flock Referring Phys: [id number] [place]  1. Left ventricular ejection fraction, by estimation, is 55 to 60%. The left ventricle has normal function. The left ventricle has no regional wall motion abnormalities. There is mild left ventricular hypertrophy. Left ventricular diastolic parameters were normal.  2. Right ventricular systolic function is normal. The right ventricular size is normal. There is normal pulmonary artery systolic pressure. The estimated right ventricular systolic pressure is 54.0 mmHg.  3. The mitral valve is grossly normal. No evidence of mitral valve regurgitation.  4. The aortic valve is tricuspid. Aortic valve regurgitation is mild.  5. The inferior vena cava is normal in size with greater than 50% respiratory variability, suggesting right atrial pressure of 3 mmHg. FINDINGS  Left Ventricle: Left ventricular ejection fraction, by estimation, is 55 to 60%. The left ventricle has normal function. The left ventricle has no regional wall motion abnormalities. Definity contrast agent was given IV to delineate the left ventricular  endocardial borders. The left ventricular internal cavity size was normal in size. There is mild left ventricular hypertrophy. Left ventricular diastolic parameters were normal. Right Ventricle: The right ventricular size is normal. No increase in right ventricular wall thickness. Right ventricular systolic function is normal. There is normal pulmonary artery systolic pressure. The tricuspid regurgitant velocity is 2.87 m/s, and  with an assumed right atrial pressure of 3 mmHg, the estimated right ventricular systolic pressure is 98.1 mmHg. Left Atrium: Left atrial size was normal in size. Right Atrium: Right atrial size was normal in size. Pericardium: There is no evidence of pericardial effusion. Mitral Valve: The mitral valve is grossly normal. No evidence of mitral  valve regurgitation. Tricuspid Valve: The tricuspid valve is grossly normal. Tricuspid valve regurgitation is trivial. Aortic Valve: The aortic valve is tricuspid. Aortic valve regurgitation is mild. Mild aortic valve annular calcification. Pulmonic Valve: The pulmonic valve was grossly normal. Pulmonic valve regurgitation is trivial. Aorta: The aortic root is normal in size and structure. Venous: The inferior vena cava is normal in size with greater than 50% respiratory variability, suggesting right atrial pressure of 3 mmHg. IAS/Shunts: No atrial level shunt detected by color flow Doppler.  LEFT VENTRICLE PLAX 2D LVIDd:         3.80 cm  Diastology LVIDs:         2.60 cm  LV e' lateral:   11.00 cm/s LV PW:         1.10 cm  LV E/e' lateral: 8.3 LV IVS:        1.20 cm  LV e' medial:    12.70 cm/s LVOT diam:     2.20 cm  LV E/e' medial:  7.2 LV SV:         90 LV SV Index:   46 LVOT Area:     3.80 cm  RIGHT VENTRICLE RV Basal diam:  3.50 cm RV S prime:     13.90 cm/s TAPSE (M-mode): 3.0 cm LEFT ATRIUM           Index       RIGHT ATRIUM           Index LA diam:      2.80 cm 1.45 cm/m  RA Area:     14.00 cm LA Vol (A2C): 66.0 ml 34.12 ml/m RA Volume:   34.10 ml  17.63 ml/m LA Vol (A4C): 26.9 ml 13.91 ml/m  AORTIC VALVE LVOT Vmax:   107.00 cm/s LVOT Vmean:  68.800 cm/s LVOT VTI:    0.236 m  AORTA Ao Root diam: 3.20  cm MITRAL VALVE               TRICUSPID VALVE MV Area (PHT): 3.03 cm    TR Peak grad:   32.9 mmHg MV Decel Time: 250 msec    TR Vmax:        287.00 cm/s MV E velocity: 91.20 cm/s MV A velocity: 73.20 cm/s  SHUNTS MV E/A ratio:  1.25        Systemic VTI:  0.24 m                            Systemic Diam: 2.20 cm Rozann Lesches MD Electronically signed by Rozann Lesches MD Signature Date/Time: 06/02/2020/2:46:38 PM    Final     Patient Profile     69 y.o. male with a history of hypertension, GERD, BPH, recurrent UTI, and previous urosepsis/bacteremia at which point he had demand ischemia by cardiac  enzymes. Previous Myoview in 2019 revealed mild inferior ischemia that was managed medically. He presents now with chest and abdominal discomfort and is found to have acute cholecystitis. High sensitivity troponin with very minimal elevation at 264, consistent with demand ischemia.   Assessment & Plan    1. Elevated troponin: He is admitted with abdominal and chest pain and found to be febrile due to acute cholecystitis  High sensitivity troponin with very minimal elevation at 264, consistent with demand ischemia in setting of fevers due to his acute abdominal issue. This does not appear to be driven by coronary plaque rupture. EKG without acute ischemic changes. Echo with normal LV systolic function without wall motion abnormalities. I would not consider a cardiac cath at this time as his presentation seems to be due to acute cholecystitis with elevated troponin felt to be due to demand ischemia.   We will follow along. No plans for cardiac cath at this time. Continue workup and treatment for acute cholecystitis. I would consider him a candidate for surgery at this time.   Signed, Lauree Chandler, MD  06/04/2020, 9:05 AM

## 2020-06-04 NOTE — Progress Notes (Signed)
HIDA scan just done, called IR but are now closed. Pt. Wants to eat ,called on call gen.surgery dr. Rosendo Gros who gave order to keep pt on  NPO. Pt. Made aware

## 2020-06-04 NOTE — Progress Notes (Signed)
[facility] Progress Note     Subjective: CC-  Feels about the same as yesterday. Continues to have some upper abdominal pain. Denies n/v. WBC slightly down 16.5, TMAX 102.7. Blood cultures growing strep species, antibiotics switched to rocephin and flagyl.  Objective: Vital signs in last 24 hours: Temp:  [98 F (36.7 C)-102.7 F (39.3 C)] 101.6 F (38.7 C) (08/30 0819) Pulse Rate:  [67-107] 96 (08/30 0821) Resp:  [15-28] 28 (08/30 0821) BP: (102-138)/(47-75) 120/64 (08/30 0819) SpO2:  [90 %-93 %] 93 % (08/30 0321) Last BM Date: 06/01/20  Intake/Output from previous day: 08/29 0701 - 08/30 0700 In: 240 [P.O.:240] Out: 1100 [Urine:1100] Intake/Output this shift: No intake/output data recorded.  PE: Gen:  Alert, NAD, pleasant HEENT: EOM's intact, pupils equal and round Card:  RRR Pulm:  CTAB, no W/R/R, rate and effort normal Abd: Soft, mild distension, mild RUQ TTP without rebound or guarding, +BS, no HSM Skin: no rashes noted, warm and dry  Lab Results:  Recent Labs    06/03/20 0115 06/04/20 0630  WBC 18.3* 16.5*  HGB 13.8 13.8  HCT 42.1 42.4  PLT 225 196   BMET Recent Labs    06/01/20 1950 06/04/20 0630  NA 138 134*  K 3.8 3.8  CL 100 94*  CO2 26 29  GLUCOSE 170* 102*  BUN 16 11  CREATININE 0.95 1.41*  CALCIUM 9.4 9.0   PT/INR Recent Labs    06/01/20 1950  LABPROT 12.4  INR 1.0   CMP     Component Value Date/Time   NA 134 (L) 06/04/2020 0630   K 3.8 06/04/2020 0630   CL 94 (L) 06/04/2020 0630   CO2 29 06/04/2020 0630   GLUCOSE 102 (H) 06/04/2020 0630   BUN 11 06/04/2020 0630   CREATININE 1.41 (H) 06/04/2020 0630   CREATININE 1.00 01/27/2020 1122   CREATININE 0.94 12/27/2019 1232   CALCIUM 9.0 06/04/2020 0630   PROT 6.5 06/03/2020 1033   ALBUMIN 3.1 (L) 06/03/2020 1033   AST 18 06/03/2020 1033   AST 17 01/27/2020 1122   ALT 15 06/03/2020 1033   ALT 14 01/27/2020 1122   ALKPHOS 53 06/03/2020 1033   BILITOT 1.0  06/03/2020 1033   BILITOT 0.3 01/27/2020 1122   GFRNONAA 50 (L) 06/04/2020 0630   GFRNONAA >60 01/27/2020 1122   GFRAA 58 (L) 06/04/2020 0630   GFRAA >60 01/27/2020 1122   Lipase     Component Value Date/Time   LIPASE 23 06/03/2020 1033       Studies/Results: CT ABDOMEN PELVIS W CONTRAST  Result Date: 06/03/2020 CLINICAL DATA:  Abdominal pain and fever. EXAM: CT ABDOMEN AND PELVIS WITH CONTRAST TECHNIQUE: Multidetector CT imaging of the abdomen and pelvis was performed using the standard protocol following bolus administration of intravenous contrast. CONTRAST:  137mL OMNIPAQUE IOHEXOL 300 MG/ML  SOLN COMPARISON:  November 28, 2019 FINDINGS: Lower chest: Mild atelectasis is seen within the posterior aspect of the bilateral lung bases. Hepatobiliary: There is diffuse fatty infiltration of the liver parenchyma. No focal liver abnormality is seen. A 1.0 cm gallstone is seen within the gallbladder lumen without evidence of gallbladder wall thickening or biliary dilatation. Mild pericholecystic inflammatory fat stranding is seen near the neck of the gallbladder. Pancreas: Unremarkable. No pancreatic ductal dilatation or surrounding inflammatory changes. Spleen: Normal in size without focal abnormality. Adrenals/Urinary Tract: Adrenal glands are unremarkable. Kidneys are normal in size, without renal calculi or hydronephrosis. A 1.3 cm simple cyst is seen within the  anterior aspect of the mid left kidney. Bladder is unremarkable. Stomach/Bowel: Stomach is within normal limits. Appendix appears normal. A 1.3 cm fat density lesion is seen within the lumen of the proximal duodenum. No evidence of bowel wall thickening, distention, or inflammatory changes. Noninflamed diverticula are seen within the proximal sigmoid colon. Vascular/Lymphatic: No significant vascular findings are present. No enlarged abdominal or pelvic lymph nodes. Reproductive: The prostate gland is markedly enlarged. Other: No abdominal  wall hernia or abnormality. No abdominopelvic ascites. Musculoskeletal: No acute or significant osseous findings. IMPRESSION: 1. Cholelithiasis with additional findings consistent with acute cholecystitis. 2. Hepatic steatosis. 3. Sigmoid diverticulosis. 4. Markedly enlarged prostate gland. Electronically Signed   By: Virgina Norfolk M.D.   On: 06/03/2020 16:03   ECHOCARDIOGRAM COMPLETE  Result Date: 06/02/2020    ECHOCARDIOGRAM REPORT   Patient Name:   [facility] Date of Exam: 06/02/2020 Medical Rec #:  [id number]     Height:       69.0 in Accession #:    [id number]    Weight:       171.3 lb Date of Birth:  06-16-1951      BSA:          1.934 m Patient Age:    69 years      BP:           143/76 mmHg Patient Gender: M             HR:           77 bpm. Exam Location:  Inpatient Procedure: 2D Echo, Cardiac Doppler, Color Doppler and Intracardiac            Opacification Agent Indications:    NSTEMI  History:        Patient has no prior history of Echocardiogram examinations.                 Acute MI, Signs/Symptoms:Chest Pain and Shortness of Breath;                 Risk Factors:Hypertension and Former Smoker. ETOH abuse.  Sonographer:    Dustin Flock Referring Phys: [id number] [place]  1. Left ventricular ejection fraction, by estimation, is 55 to 60%. The left ventricle has normal function. The left ventricle has no regional wall motion abnormalities. There is mild left ventricular hypertrophy. Left ventricular diastolic parameters were normal.  2. Right ventricular systolic function is normal. The right ventricular size is normal. There is normal pulmonary artery systolic pressure. The estimated right ventricular systolic pressure is 01.7 mmHg.  3. The mitral valve is grossly normal. No evidence of mitral valve regurgitation.  4. The aortic valve is tricuspid. Aortic valve regurgitation is mild.  5. The inferior vena cava is normal in size with greater than 50% respiratory variability,  suggesting right atrial pressure of 3 mmHg. FINDINGS  Left Ventricle: Left ventricular ejection fraction, by estimation, is 55 to 60%. The left ventricle has normal function. The left ventricle has no regional wall motion abnormalities. Definity contrast agent was given IV to delineate the left ventricular  endocardial borders. The left ventricular internal cavity size was normal in size. There is mild left ventricular hypertrophy. Left ventricular diastolic parameters were normal. Right Ventricle: The right ventricular size is normal. No increase in right ventricular wall thickness. Right ventricular systolic function is normal. There is normal pulmonary artery systolic pressure. The tricuspid regurgitant velocity is 2.87 m/s, and  with an assumed right atrial pressure of  3 mmHg, the estimated right ventricular systolic pressure is 51.7 mmHg. Left Atrium: Left atrial size was normal in size. Right Atrium: Right atrial size was normal in size. Pericardium: There is no evidence of pericardial effusion. Mitral Valve: The mitral valve is grossly normal. No evidence of mitral valve regurgitation. Tricuspid Valve: The tricuspid valve is grossly normal. Tricuspid valve regurgitation is trivial. Aortic Valve: The aortic valve is tricuspid. Aortic valve regurgitation is mild. Mild aortic valve annular calcification. Pulmonic Valve: The pulmonic valve was grossly normal. Pulmonic valve regurgitation is trivial. Aorta: The aortic root is normal in size and structure. Venous: The inferior vena cava is normal in size with greater than 50% respiratory variability, suggesting right atrial pressure of 3 mmHg. IAS/Shunts: No atrial level shunt detected by color flow Doppler.  LEFT VENTRICLE PLAX 2D LVIDd:         3.80 cm  Diastology LVIDs:         2.60 cm  LV e' lateral:   11.00 cm/s LV PW:         1.10 cm  LV E/e' lateral: 8.3 LV IVS:        1.20 cm  LV e' medial:    12.70 cm/s LVOT diam:     2.20 cm  LV E/e' medial:  7.2 LV SV:          90 LV SV Index:   46 LVOT Area:     3.80 cm  RIGHT VENTRICLE RV Basal diam:  3.50 cm RV S prime:     13.90 cm/s TAPSE (M-mode): 3.0 cm LEFT ATRIUM           Index       RIGHT ATRIUM           Index LA diam:      2.80 cm 1.45 cm/m  RA Area:     14.00 cm LA Vol (A2C): 66.0 ml 34.12 ml/m RA Volume:   34.10 ml  17.63 ml/m LA Vol (A4C): 26.9 ml 13.91 ml/m  AORTIC VALVE LVOT Vmax:   107.00 cm/s LVOT Vmean:  68.800 cm/s LVOT VTI:    0.236 m  AORTA Ao Root diam: 3.20 cm MITRAL VALVE               TRICUSPID VALVE MV Area (PHT): 3.03 cm    TR Peak grad:   32.9 mmHg MV Decel Time: 250 msec    TR Vmax:        287.00 cm/s MV E velocity: 91.20 cm/s MV A velocity: 73.20 cm/s  SHUNTS MV E/A ratio:  1.25        Systemic VTI:  0.24 m                            Systemic Diam: 2.20 cm Rozann Lesches MD Electronically signed by Rozann Lesches MD Signature Date/Time: 06/02/2020/2:46:38 PM    Final     Anti-infectives: Anti-infectives (From admission, onward)   Start     Dose/Rate Route Frequency Ordered Stop   06/04/20 0600  metroNIDAZOLE (FLAGYL) IVPB 500 mg        500 mg 100 mL/hr over 60 Minutes Intravenous Every 8 hours 06/04/20 0253     06/04/20 0300  cefTRIAXone (ROCEPHIN) 2 g in sodium chloride 0.9 % 100 mL IVPB        2 g 200 mL/hr over 30 Minutes Intravenous Every 24 hours 06/04/20 0253     06/03/20 1700  piperacillin-tazobactam (ZOSYN) IVPB 3.375 g  Status:  Discontinued        3.375 g 12.5 mL/hr over 240 Minutes Intravenous Every 8 hours 06/03/20 1630 06/04/20 0253       Assessment/Plan HTN GERD BPH DM - A1c 6 Chest pain, likely 2/2 type II NSTEMI - cardiology following, holding on cath due to fever/leukocytosis work up  Strep bacteremia Acute calculous cholecystitis  - Patient is likely high risk for surgery/general anesthesia at this time due to suspected type II NSTEMI and need for cardiac cath. Would recommend percutaneous cholecystostomy for now to allow patient to get his cardiac  cath. IR consult placed.  ID - zosyn 8/29>>8/30, rocephin/flagyl 8/30>> FEN - IVF, NPO VTE - IV heparin Foley - none Follow up - TBD   LOS: 3 days    Wellington Hampshire, [facility] 06/04/2020, 8:22 AM Please see Amion for pager number during day hours 7:00am-4:30pm

## 2020-06-04 NOTE — Progress Notes (Addendum)
Progress Note    Phillip Kirk  BDZ:[id number] DOB: October 24, 1950  DOA: 06/01/2020 PCP: Lavella Lemons, PA    Brief Narrative:     Medical records reviewed and are as summarized below:  Phillip Kirk is an 69 y.o. male with medical history significant of hypertension, GERD, BPH who presented to the ER with chest pain.  Now developed right flank pain and chills.  Found to have acute cholecystitis.  Assessment/Plan:   Principal Problem:   NSTEMI (non-ST elevated myocardial infarction) ([place]) Active Problems:   Essential hypertension, benign   GERD (gastroesophageal reflux disease)   BPH (benign prostatic hyperplasia)   Chest pain, likely secondary to demand ischemia from infection -peak high-sensitivity troponin I of 263. ECG shows no acute ST segment changes.  - Echocardiogram shows normal LVEF at 55 to 60% without regional wall motion abnormalities. -cardiology consult appreciated: no need for cath   Severe sepsis -temp > 100.9 -WBCs > 12 -source GB -AKI -see below regarding treatment -treatment has already been initiated so will not use order-set at this time  Right flank pain/fever reported and chills and leukocytosis -CT abdomen pelvis: shows acute cholecystitis: will start IV abx, general surgery consulted -IR perc drain vs cholecystectomy: per  Surgery note appears to be leaning towards drain -HIDA pending  Gram + bacteremia: strep species -zosyn changed to flagyl/ceftriaxone  -will need repeat blood cultures to ensure resolution -colonoscopy 02/2020: The perianal and digital rectal examinations were normal. Scattered diverticula were found in the entire colon.  External hemorrhoids were found during retroflexion. The hemorrhoids   were small  Hypertension -Continue amlodipine, metoprolol  Gastroesophageal flux disease -Continue PPI  Benign prostatic hypertrophy -Continue tamsulosin  Hyperglycemia -? Related to infection -SSI -HgbA1c: 6   Family  Communication/Anticipated D/C date and plan/Code Status   DVT prophylaxis: heparin Code Status: Full Code.  Family Communication: wife at bedside Disposition Plan: Status is: Inpatient  Remains inpatient appropriate because:Hemodynamically unstable   Dispo: The patient is from: Home              Anticipated d/c is to: Home              Anticipated d/c date is: 2 days- 3 days              Patient currently is not medically stable to d/c.         Medical Consultants:    Cards  General surgery   Subjective:   No SOB, no CP  Objective:    Vitals:   06/04/20 0321 06/04/20 0819 06/04/20 0821 06/04/20 0822  BP: 120/65 120/64    Pulse: 78 (!) 102 96 100  Resp: (!) 23 15 (!) 28 (!) 23  Temp: 98.5 F (36.9 C) (!) 101.6 F (38.7 C)    TempSrc: Oral Oral    SpO2: 93%   91%  Weight:      Height:        Intake/Output Summary (Last 24 hours) at 06/04/2020 0943 Last data filed at 06/04/2020 0500 Gross per 24 hour  Intake 0 ml  Output 800 ml  Net -800 ml   Filed Weights   06/01/20 1928 06/02/20 0535 06/03/20 0400  Weight: 77.1 kg 77.7 kg 77.1 kg    Exam:  General: Appearance:     Overweight male in no acute distress, non-toxic appearing  GI: Mild RUQ pain, +BS  Lungs:      respirations unlabored  Heart:    Tachycardic. Normal rhythm.  No murmurs, rubs, or gallops.   MS:   All extremities are intact.   Neurologic:   Awake, alert, oriented x 3. No apparent focal neurological           defect.     Data Reviewed:   I have personally reviewed following labs and imaging studies:  Labs: Labs show the following:   Basic Metabolic Panel: Recent Labs  Lab 06/01/20 1950 06/04/20 0630  NA 138 134*  K 3.8 3.8  CL 100 94*  CO2 26 29  GLUCOSE 170* 102*  BUN 16 11  CREATININE 0.95 1.41*  CALCIUM 9.4 9.0   GFR Estimated Creatinine Clearance: 49.4 mL/min (A) (by C-G formula based on SCr of 1.41 mg/dL (H)). Liver Function Tests: Recent Labs  Lab  06/03/20 1033  AST 18  ALT 15  ALKPHOS 53  BILITOT 1.0  PROT 6.5  ALBUMIN 3.1*   Recent Labs  Lab 06/03/20 1033  LIPASE 23   No results for input(s): AMMONIA in the last 168 hours. Coagulation profile Recent Labs  Lab 06/01/20 1950  INR 1.0    CBC: Recent Labs  Lab 06/01/20 1950 06/02/20 1446 06/03/20 0115 06/04/20 0630  WBC 15.6* 13.6* 18.3* 16.5*  HGB 15.8 14.0 13.8 13.8  HCT 48.0 42.5 42.1 42.4  MCV 89.2 87.4 87.7 88.1  PLT 258 232 225 196   Cardiac Enzymes: No results for input(s): CKTOTAL, CKMB, CKMBINDEX, TROPONINI in the last 168 hours. BNP (last 3 results) No results for input(s): PROBNP in the last 8760 hours. CBG: Recent Labs  Lab 06/03/20 1158 06/03/20 1545 06/03/20 2113 06/04/20 0649 06/04/20 0651  GLUCAP 132* 137* 141* 30* 98   D-Dimer: No results for input(s): DDIMER in the last 72 hours. Hgb A1c: Recent Labs    06/04/20 0630  HGBA1C 6.0*   Lipid Profile: No results for input(s): CHOL, HDL, LDLCALC, TRIG, CHOLHDL, LDLDIRECT in the last 72 hours. Thyroid function studies: No results for input(s): TSH, T4TOTAL, T3FREE, THYROIDAB in the last 72 hours.  Invalid input(s): FREET3 Anemia work up: No results for input(s): VITAMINB12, FOLATE, FERRITIN, TIBC, IRON, RETICCTPCT in the last 72 hours. Sepsis Labs: Recent Labs  Lab 06/01/20 1950 06/02/20 1446 06/03/20 0115 06/04/20 0630  WBC 15.6* 13.6* 18.3* 16.5*    Microbiology Recent Results (from the past 240 hour(s))  SARS Coronavirus 2 by RT PCR (hospital order, performed in [facility] hospital lab) Nasopharyngeal Nasopharyngeal Swab     Status: None   Collection Time: 06/01/20  9:55 PM   Specimen: Nasopharyngeal Swab  Result Value Ref Range Status   SARS Coronavirus 2 NEGATIVE NEGATIVE Final    Comment: (NOTE) SARS-CoV-2 target nucleic acids are NOT DETECTED.  The SARS-CoV-2 RNA is generally detectable in upper and lower respiratory specimens during the acute phase of  infection. The lowest concentration of SARS-CoV-2 viral copies this assay can detect is 250 copies / mL. A negative result does not preclude SARS-CoV-2 infection and should not be used as the sole basis for treatment or other patient management decisions.  A negative result may occur with improper specimen collection / handling, submission of specimen other than nasopharyngeal swab, presence of viral mutation(s) within the areas targeted by this assay, and inadequate number of viral copies (<250 copies / mL). A negative result must be combined with clinical observations, patient history, and epidemiological information.  Fact Sheet for Patients:   StrictlyIdeas.no  Fact Sheet for Healthcare Providers: BankingDealers.co.za  This test is not yet approved or  cleared by the [place] and has been authorized for detection and/or diagnosis of SARS-CoV-2 by FDA under an Emergency Use Authorization (EUA).  This EUA will remain in effect (meaning this test can be used) for the duration of the COVID-19 declaration under Section 564(b)(1) of the Act, 21 U.S.C. section 360bbb-3(b)(1), unless the authorization is terminated or revoked sooner.  Performed at [facility], [address], [place], [place] [address]   MRSA PCR Screening     Status: None   Collection Time: 06/02/20  5:50 AM   Specimen: Nasal Mucosa; Nasopharyngeal  Result Value Ref Range Status   MRSA by PCR NEGATIVE NEGATIVE Final    Comment:        The GeneXpert MRSA Assay (FDA approved for NASAL specimens only), is one component of a comprehensive MRSA colonization surveillance program. It is not intended to diagnose MRSA infection nor to guide or monitor treatment for MRSA infections. Performed at [facility], [place] [address] [place], [place] [address]   Culture, Urine     Status: None (Preliminary result)   Collection Time: 06/03/20 10:13 AM   Specimen:  Urine, Random  Result Value Ref Range Status   Specimen Description URINE, RANDOM  Final   Special Requests NONE  Final   Culture   Final    CULTURE REINCUBATED FOR BETTER GROWTH Performed at [facility], [place] [address], [place], [place] [address]    Report Status PENDING  Incomplete  Culture, blood (Routine X 2) w Reflex to ID Panel     Status: None (Preliminary result)   Collection Time: 06/03/20 10:38 AM   Specimen: BLOOD  Result Value Ref Range Status   Specimen Description BLOOD LEFT ANTECUBITAL  Final   Special Requests   Final    BOTTLES DRAWN AEROBIC AND ANAEROBIC Blood Culture adequate volume   Culture  Setup Time   Final    GRAM POSITIVE COCCI IN PAIRS IN CHAINS IN BOTH AEROBIC AND ANAEROBIC BOTTLES CRITICAL RESULT CALLED TO, READ BACK BY AND VERIFIED WITH: J. LEDFORD,PHARMD 0246 06/04/2020 Mena Goes Performed at [facility], [place] [address], [place], [place] [address]    Culture GRAM POSITIVE COCCI  Final   Report Status PENDING  Incomplete  Blood Culture ID Panel (Reflexed)     Status: Abnormal   Collection Time: 06/03/20 10:38 AM  Result Value Ref Range Status   Enterococcus faecalis NOT DETECTED NOT DETECTED Final   Enterococcus Faecium NOT DETECTED NOT DETECTED Final   Listeria monocytogenes NOT DETECTED NOT DETECTED Final   Staphylococcus species NOT DETECTED NOT DETECTED Final   Staphylococcus aureus (BCID) NOT DETECTED NOT DETECTED Final   Staphylococcus epidermidis NOT DETECTED NOT DETECTED Final   Staphylococcus lugdunensis NOT DETECTED NOT DETECTED Final   Streptococcus species DETECTED (A) NOT DETECTED Final    Comment: Not Enterococcus species, Streptococcus agalactiae, Streptococcus pyogenes, or Streptococcus pneumoniae. CRITICAL RESULT CALLED TO, READ BACK BY AND VERIFIED WITH: J. LEDFORD,PHARMD 0246 06/04/2020 T. TYSOR    Streptococcus agalactiae NOT DETECTED NOT DETECTED Final   Streptococcus pneumoniae NOT DETECTED NOT DETECTED Final    Streptococcus pyogenes NOT DETECTED NOT DETECTED Final   A.calcoaceticus-baumannii NOT DETECTED NOT DETECTED Final   Bacteroides fragilis NOT DETECTED NOT DETECTED Final   Enterobacterales NOT DETECTED NOT DETECTED Final   Enterobacter cloacae complex NOT DETECTED NOT DETECTED Final   Escherichia coli NOT DETECTED NOT DETECTED Final   Klebsiella aerogenes NOT DETECTED NOT DETECTED Final  Klebsiella oxytoca NOT DETECTED NOT DETECTED Final   Klebsiella pneumoniae NOT DETECTED NOT DETECTED Final   Proteus species NOT DETECTED NOT DETECTED Final   Salmonella species NOT DETECTED NOT DETECTED Final   Serratia marcescens NOT DETECTED NOT DETECTED Final   Haemophilus influenzae NOT DETECTED NOT DETECTED Final   Neisseria meningitidis NOT DETECTED NOT DETECTED Final   Pseudomonas aeruginosa NOT DETECTED NOT DETECTED Final   Stenotrophomonas maltophilia NOT DETECTED NOT DETECTED Final   Candida albicans NOT DETECTED NOT DETECTED Final   Candida auris NOT DETECTED NOT DETECTED Final   Candida glabrata NOT DETECTED NOT DETECTED Final   Candida krusei NOT DETECTED NOT DETECTED Final   Candida parapsilosis NOT DETECTED NOT DETECTED Final   Candida tropicalis NOT DETECTED NOT DETECTED Final   Cryptococcus neoformans/gattii NOT DETECTED NOT DETECTED Final    Comment: Performed at [facility], [place] [address] [place], [place] [address]  Culture, blood (Routine X 2) w Reflex to ID Panel     Status: None (Preliminary result)   Collection Time: 06/03/20 10:45 AM   Specimen: BLOOD LEFT HAND  Result Value Ref Range Status   Specimen Description BLOOD LEFT HAND  Final   Special Requests   Final    BOTTLES DRAWN AEROBIC AND ANAEROBIC Blood Culture results may not be optimal due to an inadequate volume of blood received in culture bottles   Culture  Setup Time   Final    GRAM POSITIVE COCCI IN PAIRS IN CHAINS IN BOTH AEROBIC AND ANAEROBIC BOTTLES CRITICAL VALUE NOTED.  VALUE IS CONSISTENT WITH  PREVIOUSLY REPORTED AND CALLED VALUE. Performed at [facility], [place] [address] [place],  [address]    Culture GRAM POSITIVE COCCI  Final   Report Status PENDING  Incomplete    Procedures and diagnostic studies:  CT ABDOMEN PELVIS W CONTRAST  Result Date: 06/03/2020 CLINICAL DATA:  Abdominal pain and fever. EXAM: CT ABDOMEN AND PELVIS WITH CONTRAST TECHNIQUE: Multidetector CT imaging of the abdomen and pelvis was performed using the standard protocol following bolus administration of intravenous contrast. CONTRAST:  130mL OMNIPAQUE IOHEXOL 300 MG/ML  SOLN COMPARISON:  November 28, 2019 FINDINGS: Lower chest: Mild atelectasis is seen within the posterior aspect of the bilateral lung bases. Hepatobiliary: There is diffuse fatty infiltration of the liver parenchyma. No focal liver abnormality is seen. A 1.0 cm gallstone is seen within the gallbladder lumen without evidence of gallbladder wall thickening or biliary dilatation. Mild pericholecystic inflammatory fat stranding is seen near the neck of the gallbladder. Pancreas: Unremarkable. No pancreatic ductal dilatation or surrounding inflammatory changes. Spleen: Normal in size without focal abnormality. Adrenals/Urinary Tract: Adrenal glands are unremarkable. Kidneys are normal in size, without renal calculi or hydronephrosis. A 1.3 cm simple cyst is seen within the anterior aspect of the mid left kidney. Bladder is unremarkable. Stomach/Bowel: Stomach is within normal limits. Appendix appears normal. A 1.3 cm fat density lesion is seen within the lumen of the proximal duodenum. No evidence of bowel wall thickening, distention, or inflammatory changes. Noninflamed diverticula are seen within the proximal sigmoid colon. Vascular/Lymphatic: No significant vascular findings are present. No enlarged abdominal or pelvic lymph nodes. Reproductive: The prostate gland is markedly enlarged. Other: No abdominal wall hernia or abnormality. No  abdominopelvic ascites. Musculoskeletal: No acute or significant osseous findings. IMPRESSION: 1. Cholelithiasis with additional findings consistent with acute cholecystitis. 2. Hepatic steatosis. 3. Sigmoid diverticulosis. 4. Markedly enlarged prostate gland. Electronically Signed   By: Virgina Norfolk  M.D.   On: 06/03/2020 16:03   ECHOCARDIOGRAM COMPLETE  Result Date: 06/02/2020    ECHOCARDIOGRAM REPORT   Patient Name:   [facility] Date of Exam: 06/02/2020 Medical Rec #:  [id number]     Height:       69.0 in Accession #:    [id number]    Weight:       171.3 lb Date of Birth:  [birth year]      BSA:          1.934 m Patient Age:    51 years      BP:           143/76 mmHg Patient Gender: M             HR:           77 bpm. Exam Location:  Inpatient Procedure: 2D Echo, Cardiac Doppler, Color Doppler and Intracardiac            Opacification Agent Indications:    NSTEMI  History:        Patient has no prior history of Echocardiogram examinations.                 Acute MI, Signs/Symptoms:Chest Pain and Shortness of Breath;                 Risk Factors:Hypertension and Former Smoker. ETOH abuse.  Sonographer:    Dustin Flock Referring Phys: [id number] [place]  1. Left ventricular ejection fraction, by estimation, is 55 to 60%. The left ventricle has normal function. The left ventricle has no regional wall motion abnormalities. There is mild left ventricular hypertrophy. Left ventricular diastolic parameters were normal.  2. Right ventricular systolic function is normal. The right ventricular size is normal. There is normal pulmonary artery systolic pressure. The estimated right ventricular systolic pressure is 76.1 mmHg.  3. The mitral valve is grossly normal. No evidence of mitral valve regurgitation.  4. The aortic valve is tricuspid. Aortic valve regurgitation is mild.  5. The inferior vena cava is normal in size with greater than 50% respiratory variability, suggesting right atrial  pressure of 3 mmHg. FINDINGS  Left Ventricle: Left ventricular ejection fraction, by estimation, is 55 to 60%. The left ventricle has normal function. The left ventricle has no regional wall motion abnormalities. Definity contrast agent was given IV to delineate the left ventricular  endocardial borders. The left ventricular internal cavity size was normal in size. There is mild left ventricular hypertrophy. Left ventricular diastolic parameters were normal. Right Ventricle: The right ventricular size is normal. No increase in right ventricular wall thickness. Right ventricular systolic function is normal. There is normal pulmonary artery systolic pressure. The tricuspid regurgitant velocity is 2.87 m/s, and  with an assumed right atrial pressure of 3 mmHg, the estimated right ventricular systolic pressure is 60.7 mmHg. Left Atrium: Left atrial size was normal in size. Right Atrium: Right atrial size was normal in size. Pericardium: There is no evidence of pericardial effusion. Mitral Valve: The mitral valve is grossly normal. No evidence of mitral valve regurgitation. Tricuspid Valve: The tricuspid valve is grossly normal. Tricuspid valve regurgitation is trivial. Aortic Valve: The aortic valve is tricuspid. Aortic valve regurgitation is mild. Mild aortic valve annular calcification. Pulmonic Valve: The pulmonic valve was grossly normal. Pulmonic valve regurgitation is trivial. Aorta: The aortic root is normal in size and structure. Venous: The inferior vena cava is normal in size with greater than 50% respiratory variability, suggesting right  atrial pressure of 3 mmHg. IAS/Shunts: No atrial level shunt detected by color flow Doppler.  LEFT VENTRICLE PLAX 2D LVIDd:         3.80 cm  Diastology LVIDs:         2.60 cm  LV e' lateral:   11.00 cm/s LV PW:         1.10 cm  LV E/e' lateral: 8.3 LV IVS:        1.20 cm  LV e' medial:    12.70 cm/s LVOT diam:     2.20 cm  LV E/e' medial:  7.2 LV SV:         90 LV SV Index:    46 LVOT Area:     3.80 cm  RIGHT VENTRICLE RV Basal diam:  3.50 cm RV S prime:     13.90 cm/s TAPSE (M-mode): 3.0 cm LEFT ATRIUM           Index       RIGHT ATRIUM           Index LA diam:      2.80 cm 1.45 cm/m  RA Area:     14.00 cm LA Vol (A2C): 66.0 ml 34.12 ml/m RA Volume:   34.10 ml  17.63 ml/m LA Vol (A4C): 26.9 ml 13.91 ml/m  AORTIC VALVE LVOT Vmax:   107.00 cm/s LVOT Vmean:  68.800 cm/s LVOT VTI:    0.236 m  AORTA Ao Root diam: 3.20 cm MITRAL VALVE               TRICUSPID VALVE MV Area (PHT): 3.03 cm    TR Peak grad:   32.9 mmHg MV Decel Time: 250 msec    TR Vmax:        287.00 cm/s MV E velocity: 91.20 cm/s MV A velocity: 73.20 cm/s  SHUNTS MV E/A ratio:  1.25        Systemic VTI:  0.24 m                            Systemic Diam: 2.20 cm Rozann Lesches MD Electronically signed by Rozann Lesches MD Signature Date/Time: 06/02/2020/2:46:38 PM    Final     Medications:   . amLODipine  5 mg Oral Daily  . aspirin EC  81 mg Oral Daily  . atorvastatin  80 mg Oral Daily  . diclofenac Sodium  2 g Topical QID  . insulin aspart  0-6 Units Subcutaneous TID WC  . metoprolol tartrate  12.5 mg Oral BID  . montelukast  10 mg Oral Daily  . nitroGLYCERIN  1 inch Topical Q6H  . pantoprazole  40 mg Oral Daily  . sodium chloride flush  3 mL Intravenous Q12H  . sodium chloride flush  3 mL Intravenous Q12H  . tamsulosin  0.4 mg Oral QPC breakfast  . zolpidem  5 mg Oral QHS   Continuous Infusions: . sodium chloride    . sodium chloride 75 mL/hr at 06/04/20 0834  . cefTRIAXone (ROCEPHIN)  IV 2 g (06/04/20 0322)  . metronidazole 500 mg (06/04/20 0653)     LOS: 3 days   Geradine Girt  [facility]   How to contact the [facility] Attending or Consulting provider [place] or covering provider during after hours [place], for this patient?  1. Check the care team in [facility] and look for a) attending/consulting [facility] provider listed and b) the [facility] team listed  2. Log into [email or web address] and use [facility]'s universal password to access. If you do not have the password, please contact the hospital operator. 3. Locate the [facility] provider you are looking for under [facility] and page to a number that you can be directly reached. 4. If you still have difficulty reaching the provider, please page the [facility] (Director on Call) for the Hospitalists listed on amion for assistance.  06/04/2020, 9:43 AM

## 2020-06-05 ENCOUNTER — Inpatient Hospital Stay (HOSPITAL_COMMUNITY): Payer: Medicare Other

## 2020-06-05 DIAGNOSIS — R778 Other specified abnormalities of plasma proteins: Secondary | ICD-10-CM

## 2020-06-05 HISTORY — PX: IR PERC CHOLECYSTOSTOMY: IMG2326

## 2020-06-05 LAB — CULTURE, BLOOD (ROUTINE X 2): Special Requests: ADEQUATE

## 2020-06-05 LAB — CBC
HCT: 41.7 % (ref 39.0–52.0)
Hemoglobin: 13.6 g/dL (ref 13.0–17.0)
MCH: 28.6 pg (ref 26.0–34.0)
MCHC: 32.6 g/dL (ref 30.0–36.0)
MCV: 87.6 fL (ref 80.0–100.0)
Platelets: 210 10*3/uL (ref 150–400)
RBC: 4.76 MIL/uL (ref 4.22–5.81)
RDW: 13.1 % (ref 11.5–15.5)
WBC: 11.1 10*3/uL — ABNORMAL HIGH (ref 4.0–10.5)
nRBC: 0 % (ref 0.0–0.2)

## 2020-06-05 LAB — BASIC METABOLIC PANEL
Anion gap: 12 (ref 5–15)
BUN: 16 mg/dL (ref 8–23)
CO2: 23 mmol/L (ref 22–32)
Calcium: 8.9 mg/dL (ref 8.9–10.3)
Chloride: 99 mmol/L (ref 98–111)
Creatinine, Ser: 1.14 mg/dL (ref 0.61–1.24)
GFR calc Af Amer: >60 mL/min (ref >60)
GFR calc non Af Amer: >60 mL/min (ref >60)
Glucose, Bld: 97 mg/dL (ref 70–99)
Potassium: 3.3 mmol/L — ABNORMAL LOW (ref 3.5–5.1)
Sodium: 134 mmol/L — ABNORMAL LOW (ref 135–145)

## 2020-06-05 LAB — URINE CULTURE: Culture: >=100000 — AB

## 2020-06-05 LAB — GLUCOSE, CAPILLARY
Glucose-Capillary: 30 mg/dL — CL (ref 70–99)
Glucose-Capillary: 97 mg/dL (ref 70–99)
Glucose-Capillary: 120 mg/dL — ABNORMAL HIGH (ref 70–99)

## 2020-06-05 MED ORDER — POTASSIUM CHLORIDE 10 MEQ/100ML IV SOLN
10.0000 meq | INTRAVENOUS | Status: AC
  Administered 2020-06-05 (×3): 10 meq via INTRAVENOUS
  Filled 2020-06-05 (×3): qty 100

## 2020-06-05 MED ORDER — MIDAZOLAM HCL 2 MG/2ML IJ SOLN
INTRAMUSCULAR | Status: AC | PRN
  Administered 2020-06-05 (×2): 1 mg via INTRAVENOUS

## 2020-06-05 MED ORDER — FENTANYL CITRATE (PF) 100 MCG/2ML IJ SOLN
INTRAMUSCULAR | Status: AC | PRN
  Administered 2020-06-05: 25 ug via INTRAVENOUS
  Administered 2020-06-05: 50 ug via INTRAVENOUS

## 2020-06-05 MED ORDER — HEPARIN SODIUM (PORCINE) 5000 UNIT/ML IJ SOLN
5000.0000 [IU] | Freq: Three times a day (TID) | INTRAMUSCULAR | Status: DC
  Administered 2020-06-05 – 2020-06-08 (×8): 5000 [IU] via SUBCUTANEOUS
  Filled 2020-06-05 (×8): qty 1

## 2020-06-05 MED ORDER — MIDAZOLAM HCL 2 MG/2ML IJ SOLN
INTRAMUSCULAR | Status: AC
  Filled 2020-06-05: qty 2

## 2020-06-05 MED ORDER — SODIUM CHLORIDE 0.9 % IV SOLN
3.0000 g | Freq: Four times a day (QID) | INTRAVENOUS | Status: DC
  Administered 2020-06-05 – 2020-06-08 (×11): 3 g via INTRAVENOUS
  Filled 2020-06-05 (×2): qty 8
  Filled 2020-06-05: qty 3
  Filled 2020-06-05 (×2): qty 8
  Filled 2020-06-05 (×3): qty 3
  Filled 2020-06-05 (×3): qty 8
  Filled 2020-06-05: qty 3
  Filled 2020-06-05 (×4): qty 8

## 2020-06-05 MED ORDER — IOHEXOL 300 MG/ML  SOLN: 50.0000 mL | Freq: Once | INTRAMUSCULAR | Status: DC | PRN

## 2020-06-05 MED ORDER — LIDOCAINE HCL 1 % IJ SOLN
INTRAMUSCULAR | Status: AC
  Filled 2020-06-05: qty 20

## 2020-06-05 MED ORDER — FENTANYL CITRATE (PF) 100 MCG/2ML IJ SOLN
INTRAMUSCULAR | Status: AC
  Filled 2020-06-05: qty 2

## 2020-06-05 MED ORDER — LIDOCAINE HCL (PF) 1 % IJ SOLN
INTRAMUSCULAR | Status: AC | PRN
  Administered 2020-06-05: 5 mL

## 2020-06-05 NOTE — Progress Notes (Signed)
Potassium runs given slowly due to pt complaining of burning on iv site.no swelling noted

## 2020-06-05 NOTE — Procedures (Signed)
Interventional Radiology Procedure Note  Procedure:  Placement of percutaneous cholecystostomy tube for acute calculous cholecystitis.   Complications: None  Estimated Blood Loss: None  Recommendations: - Tube to gravity bag - Bile sent for culture   Signed,  Criselda Peaches, MD

## 2020-06-05 NOTE — Progress Notes (Signed)
Transported to IR   by bed awake and alert. 

## 2020-06-05 NOTE — Progress Notes (Addendum)
Progress Note    Phillip Kirk  JSE:[id number] DOB: 09/21/1951  DOA: 06/01/2020 PCP: Lavella Lemons, PA    Brief Narrative:     Medical records reviewed and are as summarized below:  Phillip Kirk is an 69 y.o. male with medical history significant of hypertension, GERD, BPH who presented to the ER with chest pain.  Now developed right flank pain and chills.  Found to have acute cholecystitis and found to be bacteremic with strep gallo.    Assessment/Plan:   Principal Problem:   NSTEMI (non-ST elevated myocardial infarction) ([place]) Active Problems:   Essential hypertension, benign   Acute renal failure (HCC)   GERD (gastroesophageal reflux disease)   BPH (benign prostatic hyperplasia)   Severe sepsis (HCC)   Acute cholecystitis   Chest pain, likely secondary to demand ischemia from infection -peak high-sensitivity troponin I of 263. ECG shows no acute ST segment changes.  - Echocardiogram shows normal LVEF at 55 to 60% without regional wall motion abnormalities. -cardiology consult appreciated: no need for cath   Severe sepsis due to strep gallo bacteremia -temp > 100.9 -WBCs > 12 -source GB -AKI -see below regarding treatment -treatment has already been initiated so will not use order-set at this time -blood culture: strep gallo Urine culture: enterococcus faecalis--unclear of significance  Right flank pain/fever reported and chills and leukocytosis -CT abdomen pelvis: shows acute cholecystitis: will start IV abx, general surgery consulted -general surgery recommends perc drain- IR has been consulted -HIDA positive for acute cholecystitis  Gram + bacteremia: strep gallo -zosyn changed to flagyl/ceftriaxone  -repeat blood cultures -colonoscopy 02/2020: The perianal and digital rectal examinations were normal. Scattered diverticula were found in the entire colon.  External hemorrhoids were found during retroflexion. The hemorrhoids   were small -spoke with ID: no  need for TEE if repeat cultures cleared- can use amoxicillin upon d/c or something similar  Hypertension -Continue amlodipine, metoprolol  Gastroesophageal flux disease -Continue PPI  Benign prostatic hypertrophy -Continue tamsulosin  Hyperglycemia -? Related to infection -SSI -HgbA1c: 6   Family Communication/Anticipated D/C date and plan/Code Status   DVT prophylaxis: heparin Code Status: Full Code.  Family Communication: wife at bedside Disposition Plan: Status is: Inpatient  Remains inpatient appropriate because:Hemodynamically unstable   Dispo: The patient is from: Home              Anticipated d/c is to: Home              Anticipated d/c date is: 2 days- 3 days              Patient currently is not medically stable to d/c.         Medical Consultants:    Cards  General surgery   Subjective:   Pain improved, hungry  Objective:    Vitals:   06/04/20 2048 06/05/20 0352 06/05/20 0554 06/05/20 0800  BP: 115/64     Pulse: 94 96  86  Resp: 20   (!) 22  Temp: 98.9 F (37.2 C) 98.8 F (37.1 C)  98.8 F (37.1 C)  TempSrc: Oral Oral  Oral  SpO2: 94%   94%  Weight:   77.2 kg   Height:        Intake/Output Summary (Last 24 hours) at 06/05/2020 0851 Last data filed at 06/04/2020 1900 Gross per 24 hour  Intake 750 ml  Output --  Net 750 ml   Filed Weights   06/02/20 0535 06/03/20 0400 06/05/20 0554  Weight: 77.7 kg 77.1 kg 77.2 kg    Exam:   General: Appearance:     Overweight male in no acute distress     Lungs:     respirations unlabored  Heart:    Normal heart rate. Normal rhythm. No murmurs, rubs, or gallops.   MS:   All extremities are intact.   Neurologic:   Awake, alert, oriented x 3. No apparent focal neurological           defect.     Data Reviewed:   I have personally reviewed following labs and imaging studies:  Labs: Labs show the following:   Basic Metabolic Panel: Recent Labs  Lab 06/01/20 1950 06/01/20 1950  06/04/20 0630 06/05/20 0602  NA 138  --  134* 134*  K 3.8   < > 3.8 3.3*  CL 100  --  94* 99  CO2 26  --  29 23  GLUCOSE 170*  --  102* 97  BUN 16  --  11 16  CREATININE 0.95  --  1.41* 1.14  CALCIUM 9.4  --  9.0 8.9   < > = values in this interval not displayed.   GFR Estimated Creatinine Clearance: 61.2 mL/min (by C-G formula based on SCr of 1.14 mg/dL). Liver Function Tests: Recent Labs  Lab 06/03/20 1033  AST 18  ALT 15  ALKPHOS 53  BILITOT 1.0  PROT 6.5  ALBUMIN 3.1*   Recent Labs  Lab 06/03/20 1033  LIPASE 23   No results for input(s): AMMONIA in the last 168 hours. Coagulation profile Recent Labs  Lab 06/01/20 1950  INR 1.0    CBC: Recent Labs  Lab 06/01/20 1950 06/02/20 1446 06/03/20 0115 06/04/20 0630 06/05/20 0602  WBC 15.6* 13.6* 18.3* 16.5* 11.1*  HGB 15.8 14.0 13.8 13.8 13.6  HCT 48.0 42.5 42.1 42.4 41.7  MCV 89.2 87.4 87.7 88.1 87.6  PLT 258 232 225 196 210   Cardiac Enzymes: No results for input(s): CKTOTAL, CKMB, CKMBINDEX, TROPONINI in the last 168 hours. BNP (last 3 results) No results for input(s): PROBNP in the last 8760 hours. CBG: Recent Labs  Lab 06/04/20 0651 06/04/20 1153 06/04/20 1803 06/04/20 2036 06/05/20 0736  GLUCAP 98 105* 96 107* 97   D-Dimer: No results for input(s): DDIMER in the last 72 hours. Hgb A1c: Recent Labs    06/04/20 0630  HGBA1C 6.0*   Lipid Profile: No results for input(s): CHOL, HDL, LDLCALC, TRIG, CHOLHDL, LDLDIRECT in the last 72 hours. Thyroid function studies: No results for input(s): TSH, T4TOTAL, T3FREE, THYROIDAB in the last 72 hours.  Invalid input(s): FREET3 Anemia work up: No results for input(s): VITAMINB12, FOLATE, FERRITIN, TIBC, IRON, RETICCTPCT in the last 72 hours. Sepsis Labs: Recent Labs  Lab 06/02/20 1446 06/03/20 0115 06/04/20 0630 06/05/20 0602  WBC 13.6* 18.3* 16.5* 11.1*    Microbiology Recent Results (from the past 240 hour(s))  SARS Coronavirus 2 by RT  PCR (hospital order, performed in [facility] hospital lab) Nasopharyngeal Nasopharyngeal Swab     Status: None   Collection Time: 06/01/20  9:55 PM   Specimen: Nasopharyngeal Swab  Result Value Ref Range Status   SARS Coronavirus 2 NEGATIVE NEGATIVE Final    Comment: (NOTE) SARS-CoV-2 target nucleic acids are NOT DETECTED.  The SARS-CoV-2 RNA is generally detectable in upper and lower respiratory specimens during the acute phase of infection. The lowest concentration of SARS-CoV-2 viral copies this assay can detect is 250 copies / mL. A negative  result does not preclude SARS-CoV-2 infection and should not be used as the sole basis for treatment or other patient management decisions.  A negative result may occur with improper specimen collection / handling, submission of specimen other than nasopharyngeal swab, presence of viral mutation(s) within the areas targeted by this assay, and inadequate number of viral copies (<250 copies / mL). A negative result must be combined with clinical observations, patient history, and epidemiological information.  Fact Sheet for Patients:   StrictlyIdeas.no  Fact Sheet for Healthcare Providers: BankingDealers.co.za  This test is not yet approved or  cleared by the [place] FDA and has been authorized for detection and/or diagnosis of SARS-CoV-2 by FDA under an Emergency Use Authorization (EUA).  This EUA will remain in effect (meaning this test can be used) for the duration of the COVID-19 declaration under Section 564(b)(1) of the Act, 21 U.S.C. section 360bbb-3(b)(1), unless the authorization is terminated or revoked sooner.  Performed at [facility], [address] [place], [place] [address]   MRSA PCR Screening     Status: None   Collection Time: 06/02/20  5:50 AM   Specimen: Nasal Mucosa; Nasopharyngeal  Result Value Ref Range Status   MRSA by PCR NEGATIVE NEGATIVE Final    Comment:         The GeneXpert MRSA Assay (FDA approved for NASAL specimens only), is one component of a comprehensive MRSA colonization surveillance program. It is not intended to diagnose MRSA infection nor to guide or monitor treatment for MRSA infections. Performed at [facility], [place] [address] [place], [place] [address]   Culture, Urine     Status: Abnormal (Preliminary result)   Collection Time: 06/03/20 10:13 AM   Specimen: Urine, Random  Result Value Ref Range Status   Specimen Description URINE, RANDOM  Final   Special Requests NONE  Final   Culture (A)  Final    >=100,000 COLONIES/mL ENTEROCOCCUS FAECALIS SUSCEPTIBILITIES TO FOLLOW Performed at [facility], [place] [address], [place], [place] [address]    Report Status PENDING  Incomplete  Culture, blood (Routine X 2) w Reflex to ID Panel     Status: Abnormal   Collection Time: 06/03/20 10:38 AM   Specimen: BLOOD  Result Value Ref Range Status   Specimen Description BLOOD LEFT ANTECUBITAL  Final   Special Requests   Final    BOTTLES DRAWN AEROBIC AND ANAEROBIC Blood Culture adequate volume   Culture  Setup Time   Final    GRAM POSITIVE COCCI IN PAIRS IN CHAINS IN BOTH AEROBIC AND ANAEROBIC BOTTLES CRITICAL RESULT CALLED TO, READ BACK BY AND VERIFIED WITH: J. LEDFORD,PHARMD 0246 06/04/2020 Mena Goes Performed at [facility], [place] [address], [place], [place] [address]    Culture STREPTOCOCCUS GALLOLYTICUS (A)  Final   Report Status 06/05/2020 FINAL  Final   Organism ID, Bacteria STREPTOCOCCUS GALLOLYTICUS  Final      Susceptibility   Streptococcus gallolyticus - MIC*    PENICILLIN 0.12 SENSITIVE Sensitive     CEFTRIAXONE 0.25 SENSITIVE Sensitive     ERYTHROMYCIN <=0.12 SENSITIVE Sensitive     LEVOFLOXACIN 2 SENSITIVE Sensitive     VANCOMYCIN <=0.12 SENSITIVE Sensitive     * STREPTOCOCCUS GALLOLYTICUS  Blood Culture ID Panel (Reflexed)     Status: Abnormal   Collection Time: 06/03/20 10:38 AM  Result  Value Ref Range Status   Enterococcus faecalis NOT DETECTED NOT DETECTED Final   Enterococcus Faecium NOT DETECTED NOT DETECTED  Final   Listeria monocytogenes NOT DETECTED NOT DETECTED Final   Staphylococcus species NOT DETECTED NOT DETECTED Final   Staphylococcus aureus (BCID) NOT DETECTED NOT DETECTED Final   Staphylococcus epidermidis NOT DETECTED NOT DETECTED Final   Staphylococcus lugdunensis NOT DETECTED NOT DETECTED Final   Streptococcus species DETECTED (A) NOT DETECTED Final    Comment: Not Enterococcus species, Streptococcus agalactiae, Streptococcus pyogenes, or Streptococcus pneumoniae. CRITICAL RESULT CALLED TO, READ BACK BY AND VERIFIED WITH: J. LEDFORD,PHARMD 0246 06/04/2020 T. TYSOR    Streptococcus agalactiae NOT DETECTED NOT DETECTED Final   Streptococcus pneumoniae NOT DETECTED NOT DETECTED Final   Streptococcus pyogenes NOT DETECTED NOT DETECTED Final   A.calcoaceticus-baumannii NOT DETECTED NOT DETECTED Final   Bacteroides fragilis NOT DETECTED NOT DETECTED Final   Enterobacterales NOT DETECTED NOT DETECTED Final   Enterobacter cloacae complex NOT DETECTED NOT DETECTED Final   Escherichia coli NOT DETECTED NOT DETECTED Final   Klebsiella aerogenes NOT DETECTED NOT DETECTED Final   Klebsiella oxytoca NOT DETECTED NOT DETECTED Final   Klebsiella pneumoniae NOT DETECTED NOT DETECTED Final   Proteus species NOT DETECTED NOT DETECTED Final   Salmonella species NOT DETECTED NOT DETECTED Final   Serratia marcescens NOT DETECTED NOT DETECTED Final   Haemophilus influenzae NOT DETECTED NOT DETECTED Final   Neisseria meningitidis NOT DETECTED NOT DETECTED Final   Pseudomonas aeruginosa NOT DETECTED NOT DETECTED Final   Stenotrophomonas maltophilia NOT DETECTED NOT DETECTED Final   Candida albicans NOT DETECTED NOT DETECTED Final   Candida auris NOT DETECTED NOT DETECTED Final   Candida glabrata NOT DETECTED NOT DETECTED Final   Candida krusei NOT DETECTED NOT DETECTED  Final   Candida parapsilosis NOT DETECTED NOT DETECTED Final   Candida tropicalis NOT DETECTED NOT DETECTED Final   Cryptococcus neoformans/gattii NOT DETECTED NOT DETECTED Final    Comment: Performed at [facility], [address], [place], [place] [address]  Culture, blood (Routine X 2) w Reflex to ID Panel     Status: Abnormal   Collection Time: 06/03/20 10:45 AM   Specimen: BLOOD LEFT HAND  Result Value Ref Range Status   Specimen Description BLOOD LEFT HAND  Final   Special Requests   Final    BOTTLES DRAWN AEROBIC AND ANAEROBIC Blood Culture results may not be optimal due to an inadequate volume of blood received in culture bottles   Culture  Setup Time   Final    GRAM POSITIVE COCCI IN PAIRS IN CHAINS IN BOTH AEROBIC AND ANAEROBIC BOTTLES CRITICAL VALUE NOTED.  VALUE IS CONSISTENT WITH PREVIOUSLY REPORTED AND CALLED VALUE.    Culture (A)  Final    STREPTOCOCCUS GALLOLYTICUS SUSCEPTIBILITIES PERFORMED ON PREVIOUS CULTURE WITHIN THE LAST 5 DAYS. Performed at [facility], [place] [address] [place], [place] [address]    Report Status 06/05/2020 FINAL  Final    Procedures and diagnostic studies:  CT ABDOMEN PELVIS W CONTRAST  Result Date: 06/03/2020 CLINICAL DATA:  Abdominal pain and fever. EXAM: CT ABDOMEN AND PELVIS WITH CONTRAST TECHNIQUE: Multidetector CT imaging of the abdomen and pelvis was performed using the standard protocol following bolus administration of intravenous contrast. CONTRAST:  161mL OMNIPAQUE IOHEXOL 300 MG/ML  SOLN COMPARISON:  November 28, 2019 FINDINGS: Lower chest: Mild atelectasis is seen within the posterior aspect of the bilateral lung bases. Hepatobiliary: There is diffuse fatty infiltration of the liver parenchyma. No focal liver abnormality is seen. A 1.0 cm gallstone is seen within the gallbladder lumen without evidence of  gallbladder wall thickening or biliary dilatation. Mild pericholecystic inflammatory fat stranding is seen near the  neck of the gallbladder. Pancreas: Unremarkable. No pancreatic ductal dilatation or surrounding inflammatory changes. Spleen: Normal in size without focal abnormality. Adrenals/Urinary Tract: Adrenal glands are unremarkable. Kidneys are normal in size, without renal calculi or hydronephrosis. A 1.3 cm simple cyst is seen within the anterior aspect of the mid left kidney. Bladder is unremarkable. Stomach/Bowel: Stomach is within normal limits. Appendix appears normal. A 1.3 cm fat density lesion is seen within the lumen of the proximal duodenum. No evidence of bowel wall thickening, distention, or inflammatory changes. Noninflamed diverticula are seen within the proximal sigmoid colon. Vascular/Lymphatic: No significant vascular findings are present. No enlarged abdominal or pelvic lymph nodes. Reproductive: The prostate gland is markedly enlarged. Other: No abdominal wall hernia or abnormality. No abdominopelvic ascites. Musculoskeletal: No acute or significant osseous findings. IMPRESSION: 1. Cholelithiasis with additional findings consistent with acute cholecystitis. 2. Hepatic steatosis. 3. Sigmoid diverticulosis. 4. Markedly enlarged prostate gland. Electronically Signed   By: Virgina Norfolk M.D.   On: 06/03/2020 16:03   NM Hepato W/EF  Result Date: 06/04/2020 CLINICAL DATA:  69 year old male with abdominal pain and cholelithiasis. EXAM: NUCLEAR MEDICINE HEPATOBILIARY IMAGING TECHNIQUE: Sequential images of the abdomen were obtained out to 60 minutes following intravenous administration of radiopharmaceutical. 3 mg of IV morphine was administered during the examination. RADIOPHARMACEUTICALS:  4.8 mCi Tc-59m Choletec IV COMPARISON:  06/03/2020 CT FINDINGS: Prompt uptake and biliary excretion of activity by the liver is seen. Biliary activity passes into small bowel, consistent with patent common bile duct. Gallbladder is not visualized on initial imaging and after morphine administration, compatible with  acute cholecystitis. IMPRESSION: 1. Nonvisualized gallbladder compatible with acute cholecystitis. 2. Patent CBD. Electronically Signed   By: Margarette Canada M.D.   On: 06/04/2020 19:23    Medications:   . amLODipine  5 mg Oral Daily  . aspirin EC  81 mg Oral Daily  . atorvastatin  80 mg Oral Daily  . diclofenac Sodium  2 g Topical QID  . heparin injection (subcutaneous)  5,000 Units Subcutaneous Q8H  . insulin aspart  0-6 Units Subcutaneous TID WC  . metoprolol tartrate  12.5 mg Oral BID  . montelukast  10 mg Oral Daily  . pantoprazole  40 mg Oral Daily  . sodium chloride flush  3 mL Intravenous Q12H  . sodium chloride flush  3 mL Intravenous Q12H  . tamsulosin  0.4 mg Oral QPC breakfast  . zolpidem  5 mg Oral QHS   Continuous Infusions: . sodium chloride    . sodium chloride 75 mL/hr at 06/05/20 0639  . cefTRIAXone (ROCEPHIN)  IV 2 g (06/05/20 0306)  . metronidazole 500 mg (06/05/20 0640)     LOS: 4 days   Geradine Girt  [facility]   How to contact the [facility] Attending or Consulting provider [place] or covering provider during after hours [place], for this patient?  1. Check the care team in [facility] and look for a) attending/consulting [facility] provider listed and b) the [facility] team listed 2. Log into [email or web address] and use [place]'s universal password to access. If you do not have the password, please contact the hospital operator. 3. Locate the [facility] provider you are looking for under [facility] and page to a number that you can be directly reached. 4. If you still have difficulty reaching the provider, please page the [facility] (Director on Call) for the Hospitalists  listed on amion for assistance.  06/05/2020, 8:51 AM

## 2020-06-05 NOTE — Discharge Instructions (Addendum)
Cholecystostomy, Care After This sheet gives you information about how to care for yourself after your procedure. Your health care provider may also give you more specific instructions. If you have problems or questions, contact your health care provider. What can I expect after the procedure? After your procedure, it is common to have soreness near the incision site of your drainage tube (catheter). Follow these instructions at home: Incision care   Follow instructions from your health care provider about how to take care of your incision site where the catheter was inserted. Make sure you: ? Wash your hands with soap and water before and after you change your bandage (dressing). If soap and water are not available, use hand sanitizer. ? Change your dressing as told by your health care provider.  Check the incision site every day for signs of infection. Check for: ? Redness, swelling, or pain. ? Fluid or blood. ? Warmth. ? Pus or a bad smell.  Do not take baths, swim, or use a hot tub until your health care provider approves. Ask your health care provider if you may take showers. You may only be allowed to take sponge baths. General instructions  Follow instructions from your health care provider about how to care for your catheter and collection bag at home.  Your health care provider will show you: ? How to record the amount of drainage from the catheter. ? How to flush the catheter. ? How to care for the catheter incision site.  Follow instructions from your health care provider about eating or drinking restrictions.  Take over-the-counter and prescription medicines only as told by your health care provider.  Keep all follow-up visits as told by your health care provider. This is important. Contact a health care provider if:  You have redness, swelling, or pain around the catheter incision site.  You have nausea or vomiting. Get help right away if:  Your abdominal pain  gets worse.  You feel dizzy or you faint while standing.  You have fluid or blood coming from the catheter incision site.  The area around the catheter incision site feels warm to the touch.  You have pus or a bad smell coming from the catheter incision site.  You have a fever.  You have shortness of breath.  You have a rapid heartbeat.  Your nausea or vomiting does not go away.  Your catheter becomes blocked.  Your catheter comes out of your abdomen. Summary  After your procedure, it is common to have soreness near the incision site of your drainage tube (catheter).  Wash your hands with soap and water before and after you change your bandage (dressing). Change your dressing as told by your health care provider.  Check the catheter incision site every day for signs of infection. Check for redness, swelling, pain, fluid, blood, warmth, pus, or a bad smell.  Contact your health care provider if you have nausea or vomiting, or if you have redness, swelling, or pain around your catheter incision site.  Get help right away if your abdominal pain gets worse, you feel dizzy, you have blood or fluid coming from the catheter incision site, you have a fever, or you have shortness of breath. This information is not intended to replace advice given to you by your health care provider. Make sure you discuss any questions you have with your health care provider. Document Revised: 04/19/2018 Document Reviewed: 04/19/2018 [employer] Patient Education  [facility] Surgical drains are used to remove extra fluid that normally builds up in a surgical wound after surgery. A surgical drain helps to heal a surgical wound. Different kinds of surgical drains include:  Active drains. These drains use suction to pull drainage away from the surgical wound. Drainage flows through a tube to a container outside of the body. With these drains, you need to keep the bulb  or the drainage container flat (compressed) at all times, except while you empty it. Flattening the bulb or container creates suction.  Passive drains. These drains allow fluid to drain naturally, by gravity. Drainage flows through a tube to a bandage (dressing) or a container outside of the body. Passive drains do not need to be emptied. A drain is placed during surgery. Right after surgery, drainage is usually bright red and a little thicker than water. The drainage may gradually turn yellow or pink and become thinner. It is likely that your health care provider will remove the drain when the drainage stops or when the amount decreases to 1-2 Tbsp (15-30 mL) during a 24-hour period. Supplies needed:  Tape.  Germ-free cleaning solution (sterile saline).  Cotton swabs.  Split gauze drain sponge: 4 x 4 inches (10 x 10 cm).  Gauze square: 4 x 4 inches (10 x 10 cm). How to care for your surgical drain Care for your drain as told by your health care provider. This is important to help prevent infection. If your drain is placed at your back, or any other hard-to-reach area, ask another person to assist you in performing the following tasks: General care  Keep the skin around the drain dry and covered with a dressing at all times.  Check your drain area every day for signs of infection. Check for: ? Redness, swelling, or pain. ? Pus or a bad smell. ? Cloudy drainage. ? Tenderness or pressure at the drain exit site. Changing the dressing Follow instructions from your health care provider about how to change your dressing. Change your dressing at least once a day. Change it more often if needed to keep the dressing dry. Make sure you: 1. Gather your supplies. 2. Wash your hands with soap and water before you change your dressing. If soap and water are not available, use hand sanitizer. 3. Remove the old dressing. Avoid using scissors to do that. 4. Wash your hands with soap and water again  after removing the old dressing. 5. Use sterile saline to clean your skin around the drain. You may need to use a cotton swab to clean the skin. 6. Place the tube through the slit in a drain sponge. Place the drain sponge so that it covers your wound. 7. Place the gauze square or another drain sponge on top of the drain sponge that is on the wound. Make sure the tube is between those layers. 8. Tape the dressing to your skin. 9. Tape the drainage tube to your skin 1-2 inches (2.5-5 cm) below the place where the tube enters your body. Taping keeps the tube from pulling on any stitches (sutures) that you have. 10. Wash your hands with soap and water. 11. Write down the color of your drainage and how often you change your dressing. How to empty your active drain  1. Make sure that you have a measuring cup that you can empty your drainage into. 2. Wash your hands with soap and water. If soap and water are not available, use hand sanitizer. 3. Loosen  any pins or clips that hold the tube in place. 4. If your health care provider tells you to strip the tube to prevent clots and tube blockages: ? Hold the tube at the skin with one hand. Use your other hand to pinch the tubing with your thumb and first finger. ? Gently move your fingers down the tube while squeezing very lightly. This clears any drainage, clots, or tissue from the tube. ? You may need to do this several times each day to keep the tube clear. Do not pull on the tube. 5. Open the bulb cap or the drain plug. Do not touch the inside of the cap or the bottom of the plug. 6. Turn the device upside down and gently squeeze. 7. Empty all of the drainage into the measuring cup. 8. Compress the bulb or the container and replace the cap or the plug. To compress the bulb or the container, squeeze it firmly in the middle while you close the cap or plug the container. 9. Write down the amount of drainage that you have in each 24-hour period. If you  have less than 2 Tbsp (30 mL) of drainage during 24 hours, contact your health care provider. 10. Flush the drainage down the toilet. 11. Wash your hands with soap and water. Contact a health care provider if:  You have redness, swelling, or pain around your drain area.  You have pus or a bad smell coming from your drain area.  You have a fever or chills.  The skin around your drain is warm to the touch.  The amount of drainage that you have is increasing instead of decreasing.  You have drainage that is cloudy.  There is a sudden stop or a sudden decrease in the amount of drainage that you have.  Your drain tube falls out.  Your active drain does not stay compressed after you empty it. Summary  Surgical drains are used to remove extra fluid that normally builds up in a surgical wound after surgery.  Different kinds of surgical drains include active drains and passive drains. Active drains use suction to pull drainage away from the surgical wound, and passive drains allow fluid to drain naturally.  It is important to care for your drain to prevent infection. If your drain is placed at your back, or any other hard-to-reach area, ask another person to assist you.  Contact your health care provider if you have redness, swelling, or pain around your drain area. This information is not intended to replace advice given to you by your health care provider. Make sure you discuss any questions you have with your health care provider. Document Revised: 10/27/2018 Document Reviewed: 10/27/2018 Elsevier Patient Education  2020 [employer].

## 2020-06-05 NOTE — Consult Note (Signed)
Chief Complaint: Patient was seen in consultation today for acute cholecystitis/cholecystostomy tube placement.  Referring Physician(s): Meuth, Blaine Hamper (CCS)  Supervising Physician: Jacqulynn Cadet  Patient Status: [facility] - In-pt  History of Present Illness: Phillip Kirk is a 69 y.o. male with a past medical history of hypertension, demand ischemia, GERD, BPH, recurrent cystitis, and alcohol/tobacco abuse. He presented to [facility] ED 06/01/2020 with complaints of chest pain and dyspnea. In ED, EKG unremarkable for acute ischemia and troponins elevated to 265- indicative of acute NSTEMI. Cardiology was consulted who recommended transfer and admittance to [facility] for further management. Echocardiogram revealed normal LVEF at 55-60%, cardiac cath deferred per cardiology. During work-up, patient spiked a fever in setting of increasing abdominal discomfort. CT abdomen/pelvis was obtained for further evaluation which revealed findings consistent with acute cholecystitis. HIDA scan revealed nonvisualization of gallbladder, also consistent with acute cholecystitis. CCS was consulted who states patient is not a surgical candidate (positive blood cultures, duration of symptoms, current cardiopulmonary status) and recommended IR consult for possible cholecystostomy tube placement.  CT abdomen/pelvis 06/03/2020: 1. Cholelithiasis with additional findings consistent with acute cholecystitis. 2. Hepatic steatosis. 3. Sigmoid diverticulosis. 4. Markedly enlarged prostate gland.  HIDA scan 06/04/2020: 1. Nonvisualized gallbladder compatible with acute cholecystitis. 2. Patent CBD.  IR consulted by Margie Billet, PA-C for possible image-guided percutaneous cholecystostomy tube placement. Patient awake and alert sitting in bed with no complaints at this time. Accompanied by wife at bedside. Denies fever, chills, chest pain, dyspnea, abdominal pain, or headache.   Past Medical History:  Diagnosis Date    Abnormal stress test    a. 11/2017 MV: EF 69%, HTN response. Ex time 7:13. Mild inf ischemia-->low risk.   Alcohol abuse    BPH (benign prostatic hyperplasia)    Demand ischemia ([place])    a. 01/2020 in setting of urosepsis/bacteremia-->Echo: EF >55%, mild MR/TR. No evidence of veg.   Dysphagia 07/24/2017   Essential hypertension, benign 07/24/2017   GERD (gastroesophageal reflux disease)    History of bacteremia    a. 01/2020 E. coli and Streptococcus gallolyticus bacteremia in setting of urosepsis.   History of tobacco abuse    a. Quit 1993.   Recurrent UTI    a. 01/2020 Urosepsis and bacteremia    Past Surgical History:  Procedure Laterality Date   BIOPSY  07/24/2017   Procedure: BIOPSY;  Surgeon: Rogene Houston, MD;  Location: [facility];  Service: Endoscopy;;  gastric esophagus   BIOPSY  03/01/2020   Procedure: BIOPSY;  Surgeon: Rogene Houston, MD;  Location: [facility];  Service: Endoscopy;;   CATARACT EXTRACTION     CATARACT EXTRACTION Left    COLONOSCOPY N/A 02/15/2020   Procedure: COLONOSCOPY;  Surgeon: Rogene Houston, MD;  Location: [facility];  Service: Endoscopy;  Laterality: N/A;  940   ESOPHAGEAL BRUSHING  02/15/2020   Procedure: ESOPHAGEAL BRUSHING;  Surgeon: Rogene Houston, MD;  Location: [facility];  Service: Endoscopy;;   ESOPHAGEAL DILATION N/A 07/24/2017   Procedure: ESOPHAGEAL DILATION;  Surgeon: Rogene Houston, MD;  [facility];  Service: Endoscopy;  Laterality: N/A;   ESOPHAGEAL DILATION N/A 11/19/2017   Procedure: ESOPHAGEAL DILATION;  Surgeon: Rogene Houston, MD;  [facility];  Service: Endoscopy;  Laterality: N/A;   ESOPHAGEAL DILATION N/A 02/15/2020   Procedure: ESOPHAGEAL DILATION;  Surgeon: Rogene Houston, MD;  [facility];  Service: Endoscopy;  Laterality: N/A;   ESOPHAGEAL DILATION N/A 03/01/2020  Procedure: ESOPHAGEAL DILATION;  Surgeon: Rogene Houston, MD;  [facility];  Service: Endoscopy;  Laterality: N/A;   ESOPHAGOGASTRODUODENOSCOPY N/A 07/24/2017   Procedure: ESOPHAGOGASTRODUODENOSCOPY (EGD);  Surgeon: Rogene Houston, MD;  [facility];  Service: Endoscopy;  Laterality: N/A;   ESOPHAGOGASTRODUODENOSCOPY N/A 11/19/2017   Procedure: ESOPHAGOGASTRODUODENOSCOPY (EGD);  Surgeon: Rogene Houston, MD;  [facility];  Service: Endoscopy;  Laterality: N/A;   ESOPHAGOGASTRODUODENOSCOPY N/A 02/15/2020   Procedure: ESOPHAGOGASTRODUODENOSCOPY (EGD);  Surgeon: Rogene Houston, MD;  [facility];  Service: Endoscopy;  Laterality: N/A;   ESOPHAGOGASTRODUODENOSCOPY N/A 03/01/2020   Procedure: ESOPHAGOGASTRODUODENOSCOPY (EGD);  Surgeon: Rogene Houston, MD;  [facility];  Service: Endoscopy;  Laterality: N/A;  [place] Left    rt eye surgery     trauma    Allergies: Patient has no known allergies.  Medications: Prior to Admission medications   Medication Sig Start Date End Date Taking? Authorizing Provider  acetaminophen (TYLENOL) 500 MG tablet Take 500-1,000 mg by mouth every 6 (six) hours as needed for moderate pain or headache.    Yes [provider]  amLODipine (NORVASC) 5 MG tablet Take 5 mg by mouth daily.    Yes [provider]  aspirin EC 81 MG tablet Take 1 tablet (81 mg total) by mouth daily. 03/04/20  Yes Rehman, Mechele Dawley, MD  metoprolol tartrate (LOPRESSOR) 25 MG tablet Take 12.5 mg by mouth in the morning and at bedtime. 01/20/20  Yes [provider]  montelukast (SINGULAIR) 10 MG tablet Take 1 tablet (10 mg total) by mouth daily. 12/27/19  Yes Rehman, Mechele Dawley, MD  omeprazole (PRILOSEC) 40 MG capsule Take 40 mg by mouth daily. 12/20/19  Yes [provider]  tamsulosin (FLOMAX) 0.4 MG CAPS capsule Take 0.4 mg by mouth daily after breakfast.   Yes [provider]  triamcinolone cream (KENALOG) 0.1 % Apply 1 application topically 3 (three)  times daily as needed (psoriasis).  01/21/20  Yes [provider]  zolpidem (AMBIEN) 5 MG tablet Take 5 mg by mouth daily.  01/20/20  Yes [provider]     Family History  Problem Relation Age of Onset   Alzheimer's disease Sister    Breast cancer Sister    Breast cancer Sister     Social History   Socioeconomic History   Marital status: Married    Spouse name: Not on file   Number of children: Not on file   Years of education: Not on file   Highest education level: Not on file  Occupational History   Not on file  Tobacco Use   Smoking status: Former Smoker    Packs/day: 1.00    Years: 28.00    Pack years: 28.00    Types: Cigarettes    Quit date: 10/05/1992    Years since quitting: 27.6   Smokeless tobacco: Current User    Types: Chew  Vaping Use   Vaping Use: Never used  Substance and Sexual Activity   Alcohol use: Yes    Comment: 6 pack of beer q 2 days   Drug use: No   Sexual activity: Not on file  Other Topics Concern   Not on file  Social History Narrative   Has two homes - one just [place] in [place] and one in the [place] of [place].  Currently retired and living in [place].   Social Determinants of Health  Financial Resource Strain:    Difficulty of Paying Living Expenses: Not on file  Food Insecurity:    Worried About Charity fundraiser in the Last Year: Not on file   [employer] of Food in the Last Year: Not on file  Transportation Needs:    Lack of Transportation (Medical): Not on file   Lack of Transportation (Non-Medical): Not on file  Physical Activity:    Days of Exercise per Week: Not on file   Minutes of Exercise per Session: Not on file  Stress:    Feeling of Stress : Not on file  Social Connections:    Frequency of Communication with Friends and Family: Not on file   Frequency of Social Gatherings with Friends and Family: Not on file   Attends Religious Services: Not on file    Active Member of Clubs or Organizations: Not on file   Attends Archivist Meetings: Not on file   Marital Status: Not on file     Review of Systems: A 12 point ROS discussed and pertinent positives are indicated in the HPI above.  All other systems are negative.  Review of Systems  Constitutional: Negative for chills and fever.  Respiratory: Negative for shortness of breath and wheezing.   Cardiovascular: Negative for chest pain and palpitations.  Gastrointestinal: Negative for abdominal pain.  Neurological: Negative for headaches.  Psychiatric/Behavioral: Negative for behavioral problems and confusion.    Vital Signs: BP (!) 142/72    Pulse 86    Temp 98.8 F (37.1 C) (Oral)    Resp (!) 22    Ht 5\' 9"  (1.753 m)    Wt 170 lb 3.1 oz (77.2 kg)    SpO2 94%    BMI 25.13 kg/m   Physical Exam Vitals and nursing note reviewed.  Constitutional:      General: He is not in acute distress.    Appearance: Normal appearance.  Cardiovascular:     Rate and Rhythm: Normal rate and regular rhythm.     Heart sounds: Normal heart sounds. No murmur heard.   Pulmonary:     Effort: Pulmonary effort is normal. No respiratory distress.     Breath sounds: Normal breath sounds. No wheezing.  Skin:    General: Skin is warm and dry.  Neurological:     Mental Status: He is alert and oriented to person, place, and time.      MD Evaluation Airway: WNL Heart: WNL Abdomen: WNL Chest/ Lungs: WNL ASA  Classification: 3 Mallampati/Airway Score: Two   Imaging: DG Chest 2 View  Result Date: 06/01/2020 CLINICAL DATA:  Chest pain EXAM: CHEST - 2 VIEW COMPARISON:  08/16/2019 FINDINGS: The heart size and mediastinal contours are within normal limits. Both lungs are clear. The visualized skeletal structures are unremarkable. IMPRESSION: No active cardiopulmonary disease. Electronically Signed   By: Rolm Baptise M.D.   On: 06/01/2020 21:00   CT ABDOMEN PELVIS W CONTRAST  Result Date:  06/03/2020 CLINICAL DATA:  Abdominal pain and fever. EXAM: CT ABDOMEN AND PELVIS WITH CONTRAST TECHNIQUE: Multidetector CT imaging of the abdomen and pelvis was performed using the standard protocol following bolus administration of intravenous contrast. CONTRAST:  158mL OMNIPAQUE IOHEXOL 300 MG/ML  SOLN COMPARISON:  November 28, 2019 FINDINGS: Lower chest: Mild atelectasis is seen within the posterior aspect of the bilateral lung bases. Hepatobiliary: There is diffuse fatty infiltration of the liver parenchyma. No focal liver abnormality is seen. A 1.0 cm gallstone is seen within the  gallbladder lumen without evidence of gallbladder wall thickening or biliary dilatation. Mild pericholecystic inflammatory fat stranding is seen near the neck of the gallbladder. Pancreas: Unremarkable. No pancreatic ductal dilatation or surrounding inflammatory changes. Spleen: Normal in size without focal abnormality. Adrenals/Urinary Tract: Adrenal glands are unremarkable. Kidneys are normal in size, without renal calculi or hydronephrosis. A 1.3 cm simple cyst is seen within the anterior aspect of the mid left kidney. Bladder is unremarkable. Stomach/Bowel: Stomach is within normal limits. Appendix appears normal. A 1.3 cm fat density lesion is seen within the lumen of the proximal duodenum. No evidence of bowel wall thickening, distention, or inflammatory changes. Noninflamed diverticula are seen within the proximal sigmoid colon. Vascular/Lymphatic: No significant vascular findings are present. No enlarged abdominal or pelvic lymph nodes. Reproductive: The prostate gland is markedly enlarged. Other: No abdominal wall hernia or abnormality. No abdominopelvic ascites. Musculoskeletal: No acute or significant osseous findings. IMPRESSION: 1. Cholelithiasis with additional findings consistent with acute cholecystitis. 2. Hepatic steatosis. 3. Sigmoid diverticulosis. 4. Markedly enlarged prostate gland. Electronically Signed   By:  Virgina Norfolk M.D.   On: 06/03/2020 16:03   NM Hepato W/EF  Result Date: 06/04/2020 CLINICAL DATA:  69 year old male with abdominal pain and cholelithiasis. EXAM: NUCLEAR MEDICINE HEPATOBILIARY IMAGING TECHNIQUE: Sequential images of the abdomen were obtained out to 60 minutes following intravenous administration of radiopharmaceutical. 3 mg of IV morphine was administered during the examination. RADIOPHARMACEUTICALS:  4.8 mCi Tc-94m Choletec IV COMPARISON:  06/03/2020 CT FINDINGS: Prompt uptake and biliary excretion of activity by the liver is seen. Biliary activity passes into small bowel, consistent with patent common bile duct. Gallbladder is not visualized on initial imaging and after morphine administration, compatible with acute cholecystitis. IMPRESSION: 1. Nonvisualized gallbladder compatible with acute cholecystitis. 2. Patent CBD. Electronically Signed   By: Margarette Canada M.D.   On: 06/04/2020 19:23   ECHOCARDIOGRAM COMPLETE  Result Date: 06/02/2020    ECHOCARDIOGRAM REPORT   Patient Name:   [facility] Date of Exam: 06/02/2020 Medical Rec #:  [id number]     Height:       69.0 in Accession #:    [id number]    Weight:       171.3 lb Date of Birth:  September 25, 1951      BSA:          1.934 m Patient Age:    87 years      BP:           143/76 mmHg Patient Gender: M             HR:           77 bpm. Exam Location:  Inpatient Procedure: 2D Echo, Cardiac Doppler, Color Doppler and Intracardiac            Opacification Agent Indications:    NSTEMI  History:        Patient has no prior history of Echocardiogram examinations.                 Acute MI, Signs/Symptoms:Chest Pain and Shortness of Breath;                 Risk Factors:Hypertension and Former Smoker. ETOH abuse.  Sonographer:    Dustin Flock Referring Phys: [id number] [place]  1. Left ventricular ejection fraction, by estimation, is 55 to 60%. The left ventricle has normal function. The left ventricle has no regional wall  motion abnormalities. There is mild left ventricular hypertrophy. Left  ventricular diastolic parameters were normal.  2. Right ventricular systolic function is normal. The right ventricular size is normal. There is normal pulmonary artery systolic pressure. The estimated right ventricular systolic pressure is 06.2 mmHg.  3. The mitral valve is grossly normal. No evidence of mitral valve regurgitation.  4. The aortic valve is tricuspid. Aortic valve regurgitation is mild.  5. The inferior vena cava is normal in size with greater than 50% respiratory variability, suggesting right atrial pressure of 3 mmHg. FINDINGS  Left Ventricle: Left ventricular ejection fraction, by estimation, is 55 to 60%. The left ventricle has normal function. The left ventricle has no regional wall motion abnormalities. Definity contrast agent was given IV to delineate the left ventricular  endocardial borders. The left ventricular internal cavity size was normal in size. There is mild left ventricular hypertrophy. Left ventricular diastolic parameters were normal. Right Ventricle: The right ventricular size is normal. No increase in right ventricular wall thickness. Right ventricular systolic function is normal. There is normal pulmonary artery systolic pressure. The tricuspid regurgitant velocity is 2.87 m/s, and  with an assumed right atrial pressure of 3 mmHg, the estimated right ventricular systolic pressure is 69.4 mmHg. Left Atrium: Left atrial size was normal in size. Right Atrium: Right atrial size was normal in size. Pericardium: There is no evidence of pericardial effusion. Mitral Valve: The mitral valve is grossly normal. No evidence of mitral valve regurgitation. Tricuspid Valve: The tricuspid valve is grossly normal. Tricuspid valve regurgitation is trivial. Aortic Valve: The aortic valve is tricuspid. Aortic valve regurgitation is mild. Mild aortic valve annular calcification. Pulmonic Valve: The pulmonic valve was grossly  normal. Pulmonic valve regurgitation is trivial. Aorta: The aortic root is normal in size and structure. Venous: The inferior vena cava is normal in size with greater than 50% respiratory variability, suggesting right atrial pressure of 3 mmHg. IAS/Shunts: No atrial level shunt detected by color flow Doppler.  LEFT VENTRICLE PLAX 2D LVIDd:         3.80 cm  Diastology LVIDs:         2.60 cm  LV e' lateral:   11.00 cm/s LV PW:         1.10 cm  LV E/e' lateral: 8.3 LV IVS:        1.20 cm  LV e' medial:    12.70 cm/s LVOT diam:     2.20 cm  LV E/e' medial:  7.2 LV SV:         90 LV SV Index:   46 LVOT Area:     3.80 cm  RIGHT VENTRICLE RV Basal diam:  3.50 cm RV S prime:     13.90 cm/s TAPSE (M-mode): 3.0 cm LEFT ATRIUM           Index       RIGHT ATRIUM           Index LA diam:      2.80 cm 1.45 cm/m  RA Area:     14.00 cm LA Vol (A2C): 66.0 ml 34.12 ml/m RA Volume:   34.10 ml  17.63 ml/m LA Vol (A4C): 26.9 ml 13.91 ml/m  AORTIC VALVE LVOT Vmax:   107.00 cm/s LVOT Vmean:  68.800 cm/s LVOT VTI:    0.236 m  AORTA Ao Root diam: 3.20 cm MITRAL VALVE               TRICUSPID VALVE MV Area (PHT): 3.03 cm    TR Peak grad:   32.9  mmHg MV Decel Time: 250 msec    TR Vmax:        287.00 cm/s MV E velocity: 91.20 cm/s MV A velocity: 73.20 cm/s  SHUNTS MV E/A ratio:  1.25        Systemic VTI:  0.24 m                            Systemic Diam: 2.20 cm Rozann Lesches MD Electronically signed by Rozann Lesches MD Signature Date/Time: 06/02/2020/2:46:38 PM    Final     Labs:  CBC: Recent Labs    06/02/20 1446 06/03/20 0115 06/04/20 0630 06/05/20 0602  WBC 13.6* 18.3* 16.5* 11.1*  HGB 14.0 13.8 13.8 13.6  HCT 42.5 42.1 42.4 41.7  PLT 232 225 196 210    COAGS: Recent Labs    06/01/20 1950  INR 1.0  APTT 27    BMP: Recent Labs    01/27/20 1122 06/01/20 1950 06/04/20 0630 06/05/20 0602  NA 142 138 134* 134*  K 3.8 3.8 3.8 3.3*  CL 104 100 94* 99  CO2 28 26 29 23   GLUCOSE 125* 170* 102* 97  BUN 13  16 11 16   CALCIUM 9.3 9.4 9.0 8.9  CREATININE 1.00 0.95 1.41* 1.14  GFRNONAA >60 >60 50* >60  GFRAA >60 >60 58* >60    LIVER FUNCTION TESTS: Recent Labs    01/27/20 1122 06/03/20 1033  BILITOT 0.3 1.0  AST 17 18  ALT 14 15  ALKPHOS 85 53  PROT 7.0 6.5  ALBUMIN 3.5 3.1*     Assessment and Plan:  Acute cholecystitis, not a surgical candidate per CCS (positive blood cultures, duration of symptoms, current cardiopulmonary status). Plan for image-guided percutaneous cholecystostomy tube placement in IR tentatively for today pending IR scheduling. Case/images have been reviewed by Dr. Laurence Ferrari who approves procedure, however states is feasible but difficult case secondary to colonic interposition (overlying gallbladder)- will attempt but possibility not able to place depending on colonic positioning once on IR table. Patient and wife aware of this. Patient is NPO. Afebrile. Heparin held per IR protocol. INR 1.0 06/01/2020.  Risks and benefits were discussed with the patient including, but not limited to, bleeding, infection, gallbladder perforation, bile leak, sepsis or even death. All of the patient's questions were answered, patient is agreeable to proceed. Consent signed and in IR control room.   Thank you for this interesting consult.  I greatly enjoyed meeting [employer] and look forward to participating in their care.  A copy of this report was sent to the requesting provider on this date.  Electronically Signed: Earley Abide, PA-C 06/05/2020, 9:47 AM   I spent a total of 40 Minutes in face to face in clinical consultation, greater than 50% of which was counseling/coordinating care for acute cholecystitis/cholecystostomy tube placement.

## 2020-06-05 NOTE — Progress Notes (Signed)
[facility] Progress Note     Subjective: CC-  Less abdominal pain today. Denies n/v. States that he is hungry. WBC down 11.1, TMAX 101.6. repeat blood cultures pending. HIDA positive for acute cholecystitis   Objective: Vital signs in last 24 hours: Temp:  [98.7 F (37.1 C)-101.6 F (38.7 C)] 98.8 F (37.1 C) (08/31 0352) Pulse Rate:  [94-102] 96 (08/31 0352) Resp:  [15-28] 20 (08/30 2048) BP: (115-122)/(61-69) 115/64 (08/30 2048) SpO2:  [91 %-94 %] 94 % (08/30 2048) Weight:  [77.2 kg] 77.2 kg (08/31 0554) Last BM Date: 06/01/20  Intake/Output from previous day: 08/30 0701 - 08/31 0700 In: 750 [I.V.:750] Out: -  Intake/Output this shift: No intake/output data recorded.  PE: Gen:  Alert, NAD, pleasant HEENT: EOM's intact, pupils equal and round Card:  RRR Pulm:  CTAB, no W/R/R, rate and effort normal Abd: Soft, mild distension, minimal RUQ TTP without rebound or guarding, +BS, no HSM Skin: no rashes noted, warm and dry   Lab Results:  Recent Labs    06/04/20 0630 06/05/20 0602  WBC 16.5* 11.1*  HGB 13.8 13.6  HCT 42.4 41.7  PLT 196 210   BMET Recent Labs    06/04/20 0630 06/05/20 0602  NA 134* 134*  K 3.8 3.3*  CL 94* 99  CO2 29 23  GLUCOSE 102* 97  BUN 11 16  CREATININE 1.41* 1.14  CALCIUM 9.0 8.9   PT/INR No results for input(s): LABPROT, INR in the last 72 hours. CMP     Component Value Date/Time   NA 134 (L) 06/05/2020 0602   K 3.3 (L) 06/05/2020 0602   CL 99 06/05/2020 0602   CO2 23 06/05/2020 0602   GLUCOSE 97 06/05/2020 0602   BUN 16 06/05/2020 0602   CREATININE 1.14 06/05/2020 0602   CREATININE 1.00 01/27/2020 1122   CREATININE 0.94 12/27/2019 1232   CALCIUM 8.9 06/05/2020 0602   PROT 6.5 06/03/2020 1033   ALBUMIN 3.1 (L) 06/03/2020 1033   AST 18 06/03/2020 1033   AST 17 01/27/2020 1122   ALT 15 06/03/2020 1033   ALT 14 01/27/2020 1122   ALKPHOS 53 06/03/2020 1033   BILITOT 1.0 06/03/2020 1033   BILITOT 0.3  01/27/2020 1122   GFRNONAA >60 06/05/2020 0602   GFRNONAA >60 01/27/2020 1122   GFRAA >60 06/05/2020 0602   GFRAA >60 01/27/2020 1122   Lipase     Component Value Date/Time   LIPASE 23 06/03/2020 1033       Studies/Results: CT ABDOMEN PELVIS W CONTRAST  Result Date: 06/03/2020 CLINICAL DATA:  Abdominal pain and fever. EXAM: CT ABDOMEN AND PELVIS WITH CONTRAST TECHNIQUE: Multidetector CT imaging of the abdomen and pelvis was performed using the standard protocol following bolus administration of intravenous contrast. CONTRAST:  160mL OMNIPAQUE IOHEXOL 300 MG/ML  SOLN COMPARISON:  November 28, 2019 FINDINGS: Lower chest: Mild atelectasis is seen within the posterior aspect of the bilateral lung bases. Hepatobiliary: There is diffuse fatty infiltration of the liver parenchyma. No focal liver abnormality is seen. A 1.0 cm gallstone is seen within the gallbladder lumen without evidence of gallbladder wall thickening or biliary dilatation. Mild pericholecystic inflammatory fat stranding is seen near the neck of the gallbladder. Pancreas: Unremarkable. No pancreatic ductal dilatation or surrounding inflammatory changes. Spleen: Normal in size without focal abnormality. Adrenals/Urinary Tract: Adrenal glands are unremarkable. Kidneys are normal in size, without renal calculi or hydronephrosis. A 1.3 cm simple cyst is seen within the anterior aspect of the mid  left kidney. Bladder is unremarkable. Stomach/Bowel: Stomach is within normal limits. Appendix appears normal. A 1.3 cm fat density lesion is seen within the lumen of the proximal duodenum. No evidence of bowel wall thickening, distention, or inflammatory changes. Noninflamed diverticula are seen within the proximal sigmoid colon. Vascular/Lymphatic: No significant vascular findings are present. No enlarged abdominal or pelvic lymph nodes. Reproductive: The prostate gland is markedly enlarged. Other: No abdominal wall hernia or abnormality. No  abdominopelvic ascites. Musculoskeletal: No acute or significant osseous findings. IMPRESSION: 1. Cholelithiasis with additional findings consistent with acute cholecystitis. 2. Hepatic steatosis. 3. Sigmoid diverticulosis. 4. Markedly enlarged prostate gland. Electronically Signed   By: Virgina Norfolk M.D.   On: 06/03/2020 16:03   NM Hepato W/EF  Result Date: 06/04/2020 CLINICAL DATA:  69 year old male with abdominal pain and cholelithiasis. EXAM: NUCLEAR MEDICINE HEPATOBILIARY IMAGING TECHNIQUE: Sequential images of the abdomen were obtained out to 60 minutes following intravenous administration of radiopharmaceutical. 3 mg of IV morphine was administered during the examination. RADIOPHARMACEUTICALS:  4.8 mCi Tc-76m Choletec IV COMPARISON:  06/03/2020 CT FINDINGS: Prompt uptake and biliary excretion of activity by the liver is seen. Biliary activity passes into small bowel, consistent with patent common bile duct. Gallbladder is not visualized on initial imaging and after morphine administration, compatible with acute cholecystitis. IMPRESSION: 1. Nonvisualized gallbladder compatible with acute cholecystitis. 2. Patent CBD. Electronically Signed   By: Margarette Canada M.D.   On: 06/04/2020 19:23    Anti-infectives: Anti-infectives (From admission, onward)   Start     Dose/Rate Route Frequency Ordered Stop   06/04/20 0600  metroNIDAZOLE (FLAGYL) IVPB 500 mg        500 mg 100 mL/hr over 60 Minutes Intravenous Every 8 hours 06/04/20 0253     06/04/20 0300  cefTRIAXone (ROCEPHIN) 2 g in sodium chloride 0.9 % 100 mL IVPB        2 g 200 mL/hr over 30 Minutes Intravenous Every 24 hours 06/04/20 0253     06/03/20 1700  piperacillin-tazobactam (ZOSYN) IVPB 3.375 g  Status:  Discontinued        3.375 g 12.5 mL/hr over 240 Minutes Intravenous Every 8 hours 06/03/20 1630 06/04/20 0253       Assessment/Plan HTN GERD BPH DM - A1c 6 Chest pain, elevated troponin - initially felt be to 2/2 type II  NSTEMI, now suspect demand ischemia, cardiology following and does not recommend cardiac cath  Strep bacteremia - repeat Bcx pending Acute calculous cholecystitis  - HIDA + - given positive blood culture which is surprisingly strep, the duration of his symptoms, and current cardiopulmonary status recommend percutaneous cholecystostomy tube and delayed cholecystectomy  ID - zosyn 8/29>>8/30, rocephin/flagyl 8/30>> FEN - IVF, NPO VTE - SCDs, sq heparin Foley - none Follow up - TBD   Plan: IR consult for perc chole tube. Keep NPO.   LOS: 4 days    Wellington Hampshire, [facility] 06/05/2020, 8:09 AM Please see Amion for pager number during day hours 7:00am-4:30pm

## 2020-06-05 NOTE — Progress Notes (Addendum)
Progress Note  Patient Name: Phillip Kirk Date of Encounter: 06/05/2020  [facility] Cardiologist: Dr. Domenic Polite (Former Bronson Ing)  Subjective   Feeling well. No chest pain, sob or palpitations. Mild abdominal pain.   Inpatient Medications    Scheduled Meds: . amLODipine  5 mg Oral Daily  . aspirin EC  81 mg Oral Daily  . atorvastatin  80 mg Oral Daily  . diclofenac Sodium  2 g Topical QID  . heparin injection (subcutaneous)  5,000 Units Subcutaneous Q8H  . insulin aspart  0-6 Units Subcutaneous TID WC  . metoprolol tartrate  12.5 mg Oral BID  . montelukast  10 mg Oral Daily  . pantoprazole  40 mg Oral Daily  . sodium chloride flush  3 mL Intravenous Q12H  . sodium chloride flush  3 mL Intravenous Q12H  . tamsulosin  0.4 mg Oral QPC breakfast  . zolpidem  5 mg Oral QHS   Continuous Infusions: . sodium chloride    . sodium chloride 75 mL/hr at 06/05/20 0639  . ampicillin-sulbactam (UNASYN) IV    . potassium chloride 10 mEq (06/05/20 0935)   PRN Meds: sodium chloride, acetaminophen, bisacodyl, morphine injection, nitroGLYCERIN, ondansetron **OR** ondansetron (ZOFRAN) IV, polyethylene glycol, sodium chloride flush   Vital Signs    Vitals:   06/05/20 0554 06/05/20 0730 06/05/20 0800 06/05/20 0932  BP:    (!) 142/72  Pulse:   86   Resp:   (!) 22   Temp:  98.8 F (37.1 C) 98.8 F (37.1 C)   TempSrc:  Oral Oral   SpO2:   94%   Weight: 77.2 kg     Height:        Intake/Output Summary (Last 24 hours) at 06/05/2020 0954 Last data filed at 06/05/2020 0800 Gross per 24 hour  Intake 750 ml  Output --  Net 750 ml   Last 3 Weights 06/05/2020 06/03/2020 06/02/2020  Weight (lbs) 170 lb 3.1 oz 169 lb 15.6 oz 171 lb 4.8 oz  Weight (kg) 77.2 kg 77.1 kg 77.7 kg      Telemetry   Sinus rhythm - Personally Reviewed  ECG    No new tracing   Physical Exam   GEN: No acute distress.   Neck: No JVD Cardiac: RRR, no murmurs, rubs, or gallops.  Respiratory: Clear to  auscultation bilaterally. GI: Soft MS: No edema; No deformity. Neuro:  Nonfocal  Psych: Normal affect   Labs    High Sensitivity Troponin:   Recent Labs  Lab 06/01/20 1950 06/01/20 2139 06/02/20 0201 06/02/20 0710  TROPONINIHS 256* 244* 241* 263*      Chemistry Recent Labs  Lab 06/01/20 1950 06/03/20 1033 06/04/20 0630 06/05/20 0602  NA 138  --  134* 134*  K 3.8  --  3.8 3.3*  CL 100  --  94* 99  CO2 26  --  29 23  GLUCOSE 170*  --  102* 97  BUN 16  --  11 16  CREATININE 0.95  --  1.41* 1.14  CALCIUM 9.4  --  9.0 8.9  PROT  --  6.5  --   --   ALBUMIN  --  3.1*  --   --   AST  --  18  --   --   ALT  --  15  --   --   ALKPHOS  --  53  --   --   BILITOT  --  1.0  --   --  GFRNONAA >60  --  50* >60  GFRAA >60  --  58* >60  ANIONGAP 12  --  11 12     Hematology Recent Labs  Lab 06/03/20 0115 06/04/20 0630 06/05/20 0602  WBC 18.3* 16.5* 11.1*  RBC 4.80 4.81 4.76  HGB 13.8 13.8 13.6  HCT 42.1 42.4 41.7  MCV 87.7 88.1 87.6  MCH 28.8 28.7 28.6  MCHC 32.8 32.5 32.6  RDW 13.0 13.0 13.1  PLT 225 196 210    Radiology    CT ABDOMEN PELVIS W CONTRAST  Result Date: 06/03/2020 CLINICAL DATA:  Abdominal pain and fever. EXAM: CT ABDOMEN AND PELVIS WITH CONTRAST TECHNIQUE: Multidetector CT imaging of the abdomen and pelvis was performed using the standard protocol following bolus administration of intravenous contrast. CONTRAST:  168mL OMNIPAQUE IOHEXOL 300 MG/ML  SOLN COMPARISON:  November 28, 2019 FINDINGS: Lower chest: Mild atelectasis is seen within the posterior aspect of the bilateral lung bases. Hepatobiliary: There is diffuse fatty infiltration of the liver parenchyma. No focal liver abnormality is seen. A 1.0 cm gallstone is seen within the gallbladder lumen without evidence of gallbladder wall thickening or biliary dilatation. Mild pericholecystic inflammatory fat stranding is seen near the neck of the gallbladder. Pancreas: Unremarkable. No pancreatic ductal  dilatation or surrounding inflammatory changes. Spleen: Normal in size without focal abnormality. Adrenals/Urinary Tract: Adrenal glands are unremarkable. Kidneys are normal in size, without renal calculi or hydronephrosis. A 1.3 cm simple cyst is seen within the anterior aspect of the mid left kidney. Bladder is unremarkable. Stomach/Bowel: Stomach is within normal limits. Appendix appears normal. A 1.3 cm fat density lesion is seen within the lumen of the proximal duodenum. No evidence of bowel wall thickening, distention, or inflammatory changes. Noninflamed diverticula are seen within the proximal sigmoid colon. Vascular/Lymphatic: No significant vascular findings are present. No enlarged abdominal or pelvic lymph nodes. Reproductive: The prostate gland is markedly enlarged. Other: No abdominal wall hernia or abnormality. No abdominopelvic ascites. Musculoskeletal: No acute or significant osseous findings. IMPRESSION: 1. Cholelithiasis with additional findings consistent with acute cholecystitis. 2. Hepatic steatosis. 3. Sigmoid diverticulosis. 4. Markedly enlarged prostate gland. Electronically Signed   By: Virgina Norfolk M.D.   On: 06/03/2020 16:03   NM Hepato W/EF  Result Date: 06/04/2020 CLINICAL DATA:  69 year old male with abdominal pain and cholelithiasis. EXAM: NUCLEAR MEDICINE HEPATOBILIARY IMAGING TECHNIQUE: Sequential images of the abdomen were obtained out to 60 minutes following intravenous administration of radiopharmaceutical. 3 mg of IV morphine was administered during the examination. RADIOPHARMACEUTICALS:  4.8 mCi Tc-60m Choletec IV COMPARISON:  06/03/2020 CT FINDINGS: Prompt uptake and biliary excretion of activity by the liver is seen. Biliary activity passes into small bowel, consistent with patent common bile duct. Gallbladder is not visualized on initial imaging and after morphine administration, compatible with acute cholecystitis. IMPRESSION: 1. Nonvisualized gallbladder  compatible with acute cholecystitis. 2. Patent CBD. Electronically Signed   By: Margarette Canada M.D.   On: 06/04/2020 19:23    Cardiac Studies   Echo 06/02/20 . Left ventricular ejection fraction, by estimation, is 55 to 60%. The  left ventricle has normal function. The left ventricle has no regional  wall motion abnormalities. There is mild left ventricular hypertrophy.  Left ventricular diastolic parameters  were normal.  2. Right ventricular systolic function is normal. The right ventricular  size is normal. There is normal pulmonary artery systolic pressure. The  estimated right ventricular systolic pressure is 95.6 mmHg.  3. The mitral valve is grossly normal.  No evidence of mitral valve  regurgitation.  4. The aortic valve is tricuspid. Aortic valve regurgitation is mild.  5. The inferior vena cava is normal in size with greater than 50%  respiratory variability, suggesting right atrial pressure of 3 mmHg.   Patient Profile        69 y.o. male with a history of hypertension, GERD, BPH, recurrent UTI, and previous urosepsis/bacteremia admitted with chest and abdominal pain.   Previous Myoview in 2019 revealed mild inferior ischemia that was managed medically  Assessment & Plan    1.Chest pain/ Elevated troponin  - Hs troponin flat 256>>244>>241>>263. EKG without acute ischemic changes.  - Elevated troponin consistent with demand ischemia in setting of fevers due to his acute abdominal issue.  - Echo with preserved LVEF and no WM abnormality.  - Continue ASA, Statin and BB - No further cardiac work up planned  2. Acute cholecystitis - Plan for IR per chole tube placement today given bacteremia   3. Gram + bacteremia - On broad spectrum abx - Per attending note: no need for TEE if repeat culture cleared   4. HTN - BP stable on current medications.      For questions or updates, please contact [place] Please consult [email or web address] for contact info under         Signed, Leanor Kail, PA  06/05/2020, 9:54 AM    I have personally seen and examined this patient. I agree with the assessment and plan as outlined above. No chest pain. No abdominal pain today. Febrile overnight.  Troponin mildly elevated with flat trend. Felt to be due to demand ischemia in the setting of acute cholecystitis with bacteremia/fevers. Echo with normal LV systolic function without wall motion abnormalities. No further cardiac workup at this time.  Pt for percutaneous drain today per IR for treatment of acute cholecystitis.  We will follow from a distance. Please call with questions.   Lauree Chandler 06/05/2020 11:51 AM

## 2020-06-06 ENCOUNTER — Inpatient Hospital Stay (HOSPITAL_COMMUNITY): Payer: Medicare Other

## 2020-06-06 DIAGNOSIS — A419 Sepsis, unspecified organism: Secondary | ICD-10-CM

## 2020-06-06 DIAGNOSIS — K810 Acute cholecystitis: Secondary | ICD-10-CM

## 2020-06-06 DIAGNOSIS — K219 Gastro-esophageal reflux disease without esophagitis: Secondary | ICD-10-CM

## 2020-06-06 DIAGNOSIS — R6520 Severe sepsis without septic shock: Secondary | ICD-10-CM

## 2020-06-06 LAB — GLUCOSE, CAPILLARY
Glucose-Capillary: 95 mg/dL (ref 70–99)
Glucose-Capillary: 94 mg/dL (ref 70–99)
Glucose-Capillary: 110 mg/dL — ABNORMAL HIGH (ref 70–99)
Glucose-Capillary: 131 mg/dL — ABNORMAL HIGH (ref 70–99)
Glucose-Capillary: 111 mg/dL — ABNORMAL HIGH (ref 70–99)
Glucose-Capillary: 98 mg/dL (ref 70–99)

## 2020-06-06 LAB — BASIC METABOLIC PANEL
Anion gap: 10 (ref 5–15)
BUN: 9 mg/dL (ref 8–23)
CO2: 25 mmol/L (ref 22–32)
Calcium: 8.4 mg/dL — ABNORMAL LOW (ref 8.9–10.3)
Chloride: 99 mmol/L (ref 98–111)
Creatinine, Ser: 1.04 mg/dL (ref 0.61–1.24)
GFR calc Af Amer: >60 mL/min (ref >60)
GFR calc non Af Amer: >60 mL/min (ref >60)
Glucose, Bld: 97 mg/dL (ref 70–99)
Potassium: 3.5 mmol/L (ref 3.5–5.1)
Sodium: 134 mmol/L — ABNORMAL LOW (ref 135–145)

## 2020-06-06 LAB — CBC
HCT: 38.6 % — ABNORMAL LOW (ref 39.0–52.0)
Hemoglobin: 12.7 g/dL — ABNORMAL LOW (ref 13.0–17.0)
MCH: 29.1 pg (ref 26.0–34.0)
MCHC: 32.9 g/dL (ref 30.0–36.0)
MCV: 88.3 fL (ref 80.0–100.0)
Platelets: 203 10*3/uL (ref 150–400)
RBC: 4.37 MIL/uL (ref 4.22–5.81)
RDW: 13.2 % (ref 11.5–15.5)
WBC: 8.6 10*3/uL (ref 4.0–10.5)
nRBC: 0 % (ref 0.0–0.2)

## 2020-06-06 NOTE — Progress Notes (Signed)
Referring Physician(s): Margie Billet, PA-C  Supervising Physician: Aletta Edouard  Patient Status:  [facility] - In-pt  Chief Complaint: Follow up percutaneous cholecystostomy placement 06/05/20 by Dr. Laurence Ferrari  Subjective:  Patient laying in bed, significant other at bedside. Reports soreness at insertion site but feels well otherwise. Lots of watery stool this morning, tolerating liquid diet without n/v so far today. Eager to go home.  Allergies: Patient has no known allergies.  Medications: Prior to Admission medications   Medication Sig Start Date End Date Taking? Authorizing Provider  acetaminophen (TYLENOL) 500 MG tablet Take 500-1,000 mg by mouth every 6 (six) hours as needed for moderate pain or headache.    Yes [provider]  amLODipine (NORVASC) 5 MG tablet Take 5 mg by mouth daily.    Yes [provider]  aspirin EC 81 MG tablet Take 1 tablet (81 mg total) by mouth daily. 03/04/20  Yes Rehman, Mechele Dawley, MD  metoprolol tartrate (LOPRESSOR) 25 MG tablet Take 12.5 mg by mouth in the morning and at bedtime. 01/20/20  Yes [provider]  montelukast (SINGULAIR) 10 MG tablet Take 1 tablet (10 mg total) by mouth daily. 12/27/19  Yes Rehman, Mechele Dawley, MD  omeprazole (PRILOSEC) 40 MG capsule Take 40 mg by mouth daily. 12/20/19  Yes [provider]  tamsulosin (FLOMAX) 0.4 MG CAPS capsule Take 0.4 mg by mouth daily after breakfast.   Yes [provider]  triamcinolone cream (KENALOG) 0.1 % Apply 1 application topically 3 (three) times daily as needed (psoriasis).  01/21/20  Yes [provider]  zolpidem (AMBIEN) 5 MG tablet Take 5 mg by mouth daily.  01/20/20  Yes [provider]     Vital Signs: BP 132/70 (BP Location: Left Arm)   Pulse 75   Temp 98.4 F (36.9 C) (Oral)   Resp 19   Ht 5\' 9"  (1.753 m)   Wt 169 lb 15.6 oz (77.1 kg)   SpO2 91%   BMI 25.10 kg/m   Physical Exam Vitals reviewed.  Constitutional:       General: He is not in acute distress. HENT:     Head: Normocephalic.  Cardiovascular:     Rate and Rhythm: Normal rate.  Pulmonary:     Effort: Pulmonary effort is normal.  Abdominal:     General: There is no distension.     Palpations: Abdomen is soft.     Tenderness: There is no abdominal tenderness.     Comments: (+) RUQ drain to gravity with ~75 cc cloudy bilious output. Flushes easily. Insertion site clean, dry, dressed appropriately.  Skin:    General: Skin is warm and dry.  Neurological:     Mental Status: He is alert. Mental status is at baseline.     Imaging: CT ABDOMEN PELVIS W CONTRAST  Result Date: 06/03/2020 CLINICAL DATA:  Abdominal pain and fever. EXAM: CT ABDOMEN AND PELVIS WITH CONTRAST TECHNIQUE: Multidetector CT imaging of the abdomen and pelvis was performed using the standard protocol following bolus administration of intravenous contrast. CONTRAST:  184mL OMNIPAQUE IOHEXOL 300 MG/ML  SOLN COMPARISON:  November 28, 2019 FINDINGS: Lower chest: Mild atelectasis is seen within the posterior aspect of the bilateral lung bases. Hepatobiliary: There is diffuse fatty infiltration of the liver parenchyma. No focal liver abnormality is seen. A 1.0 cm gallstone is seen within the gallbladder lumen without evidence of gallbladder wall thickening or biliary dilatation. Mild pericholecystic inflammatory fat stranding is seen near the neck of the  gallbladder. Pancreas: Unremarkable. No pancreatic ductal dilatation or surrounding inflammatory changes. Spleen: Normal in size without focal abnormality. Adrenals/Urinary Tract: Adrenal glands are unremarkable. Kidneys are normal in size, without renal calculi or hydronephrosis. A 1.3 cm simple cyst is seen within the anterior aspect of the mid left kidney. Bladder is unremarkable. Stomach/Bowel: Stomach is within normal limits. Appendix appears normal. A 1.3 cm fat density lesion is seen within the lumen of the proximal duodenum. No  evidence of bowel wall thickening, distention, or inflammatory changes. Noninflamed diverticula are seen within the proximal sigmoid colon. Vascular/Lymphatic: No significant vascular findings are present. No enlarged abdominal or pelvic lymph nodes. Reproductive: The prostate gland is markedly enlarged. Other: No abdominal wall hernia or abnormality. No abdominopelvic ascites. Musculoskeletal: No acute or significant osseous findings. IMPRESSION: 1. Cholelithiasis with additional findings consistent with acute cholecystitis. 2. Hepatic steatosis. 3. Sigmoid diverticulosis. 4. Markedly enlarged prostate gland. Electronically Signed   By: Virgina Norfolk M.D.   On: 06/03/2020 16:03   NM Hepato W/EF  Result Date: 06/04/2020 CLINICAL DATA:  69 year old male with abdominal pain and cholelithiasis. EXAM: NUCLEAR MEDICINE HEPATOBILIARY IMAGING TECHNIQUE: Sequential images of the abdomen were obtained out to 60 minutes following intravenous administration of radiopharmaceutical. 3 mg of IV morphine was administered during the examination. RADIOPHARMACEUTICALS:  4.8 mCi Tc-33m Choletec IV COMPARISON:  06/03/2020 CT FINDINGS: Prompt uptake and biliary excretion of activity by the liver is seen. Biliary activity passes into small bowel, consistent with patent common bile duct. Gallbladder is not visualized on initial imaging and after morphine administration, compatible with acute cholecystitis. IMPRESSION: 1. Nonvisualized gallbladder compatible with acute cholecystitis. 2. Patent CBD. Electronically Signed   By: Margarette Canada M.D.   On: 06/04/2020 19:23   IR Perc Cholecystostomy  Result Date: 06/05/2020 INDICATION: 69 year old male with acute calculus cholecystitis. He is currently not a surgical candidate and requires percutaneous cholecystostomy tube placement. EXAM: CHOLECYSTOSTOMY MEDICATIONS: In patient currently receiving intravenous antibiotic therapy. No additional prophylaxis administered.  ANESTHESIA/SEDATION: Moderate (conscious) sedation was employed during this procedure. A total of Versed 2 mg and Fentanyl 75 mcg was administered intravenously. Moderate Sedation Time: 16 minutes. The patient's level of consciousness and vital signs were monitored continuously by radiology nursing throughout the procedure under my direct supervision. FLUOROSCOPY TIME:  Fluoroscopy Time: 0 minutes 54 seconds (2 mGy). COMPLICATIONS: None immediate. PROCEDURE: Informed written consent was obtained from the patient after a thorough discussion of the procedural risks, benefits and alternatives. All questions were addressed. Maximal Sterile Barrier Technique was utilized including caps, mask, sterile gowns, sterile gloves, sterile drape, hand hygiene and skin antiseptic. A timeout was performed prior to the initiation of the procedure. The right upper quadrant was interrogated with ultrasound. The colon was successfully identified. A suitable skin entry site lateral to the inter positioned: Was selected. Local anesthesia was attained by infiltration with 1% lidocaine. A small dermatotomy was made. Under real time ultrasound guidance, a 21 gauge Accustick needle was advanced along a short transhepatic course and used to puncture the gallbladder lumen. There was return of cloudy bile. A 0.018 inch wire was then coiled in the gallbladder lumen. The Accustick needle was exchanged for the transitional sheath which was advanced over the wire and into the gallbladder lumen. A hand injection of contrast material was performed opacifying the gallbladder lumen. Filling defects are present consistent with known cholelithiasis. A 0.035 wire was then coiled in the gallbladder lumen. The transhepatic tract was dilated to 10 [place] and a Cook 10.2  [place] all-purpose drainage catheter was advanced over the wire and formed. Aspiration yields 30 mL turbid bile which was sent for Gram stain and culture. The drainage catheter was gently  flushed and connected to gravity bag for drainage. The catheter was secured to the skin with 0 Prolene suture and a sterile bandage was applied. The patient tolerated the procedure well. IMPRESSION: Successful placement of a 10 French percutaneous transhepatic cholecystostomy tube for treatment of acute calculus cholecystitis. Electronically Signed   By: Jacqulynn Cadet M.D.   On: 06/05/2020 15:54   DG Abd Portable 1V  Result Date: 06/06/2020 CLINICAL DATA:  Abdominal distension. EXAM: PORTABLE ABDOMEN - 1 VIEW COMPARISON:  None. FINDINGS: Mildly dilated small bowel loops are noted in the left upper quadrant concerning for ileus or possibly distal small bowel obstruction. No significant colonic dilatation is noted. Pigtail drainage catheter is noted in the right upper quadrant. IMPRESSION: Mildly dilated small bowel loops are noted in left upper quadrant concerning for ileus or possibly distal small bowel obstruction. Follow-up radiographs are recommended. Electronically Signed   By: Marijo Conception M.D.   On: 06/06/2020 09:02   ECHOCARDIOGRAM COMPLETE  Result Date: 06/02/2020    ECHOCARDIOGRAM REPORT   Patient Name:   Phillip Kirk Date of Exam: 06/02/2020 Medical Rec #:  [id number]     Height:       69.0 in Accession #:    [id number]    Weight:       171.3 lb Date of Birth:  23-Sep-1951      BSA:          1.934 m Patient Age:    69 years      BP:           143/76 mmHg Patient Gender: M             HR:           77 bpm. Exam Location:  Inpatient Procedure: 2D Echo, Cardiac Doppler, Color Doppler and Intracardiac            Opacification Agent Indications:    NSTEMI  History:        Patient has no prior history of Echocardiogram examinations.                 Acute MI, Signs/Symptoms:Chest Pain and Shortness of Breath;                 Risk Factors:Hypertension and Former Smoker. ETOH abuse.  Sonographer:    Dustin Flock Referring Phys: [id number] Flippin  1. Left ventricular ejection  fraction, by estimation, is 55 to 60%. The left ventricle has normal function. The left ventricle has no regional wall motion abnormalities. There is mild left ventricular hypertrophy. Left ventricular diastolic parameters were normal.  2. Right ventricular systolic function is normal. The right ventricular size is normal. There is normal pulmonary artery systolic pressure. The estimated right ventricular systolic pressure is 61.6 mmHg.  3. The mitral valve is grossly normal. No evidence of mitral valve regurgitation.  4. The aortic valve is tricuspid. Aortic valve regurgitation is mild.  5. The inferior vena cava is normal in size with greater than 50% respiratory variability, suggesting right atrial pressure of 3 mmHg. FINDINGS  Left Ventricle: Left ventricular ejection fraction, by estimation, is 55 to 60%. The left ventricle has normal function. The left ventricle has no regional wall motion abnormalities. Definity contrast agent was given IV to delineate the left ventricular  endocardial borders. The left ventricular internal cavity size was normal in size. There is mild left ventricular hypertrophy. Left ventricular diastolic parameters were normal. Right Ventricle: The right ventricular size is normal. No increase in right ventricular wall thickness. Right ventricular systolic function is normal. There is normal pulmonary artery systolic pressure. The tricuspid regurgitant velocity is 2.87 m/s, and  with an assumed right atrial pressure of 3 mmHg, the estimated right ventricular systolic pressure is 08.1 mmHg. Left Atrium: Left atrial size was normal in size. Right Atrium: Right atrial size was normal in size. Pericardium: There is no evidence of pericardial effusion. Mitral Valve: The mitral valve is grossly normal. No evidence of mitral valve regurgitation. Tricuspid Valve: The tricuspid valve is grossly normal. Tricuspid valve regurgitation is trivial. Aortic Valve: The aortic valve is tricuspid. Aortic  valve regurgitation is mild. Mild aortic valve annular calcification. Pulmonic Valve: The pulmonic valve was grossly normal. Pulmonic valve regurgitation is trivial. Aorta: The aortic root is normal in size and structure. Venous: The inferior vena cava is normal in size with greater than 50% respiratory variability, suggesting right atrial pressure of 3 mmHg. IAS/Shunts: No atrial level shunt detected by color flow Doppler.  LEFT VENTRICLE PLAX 2D LVIDd:         3.80 cm  Diastology LVIDs:         2.60 cm  LV e' lateral:   11.00 cm/s LV PW:         1.10 cm  LV E/e' lateral: 8.3 LV IVS:        1.20 cm  LV e' medial:    12.70 cm/s LVOT diam:     2.20 cm  LV E/e' medial:  7.2 LV SV:         90 LV SV Index:   46 LVOT Area:     3.80 cm  RIGHT VENTRICLE RV Basal diam:  3.50 cm RV S prime:     13.90 cm/s TAPSE (M-mode): 3.0 cm LEFT ATRIUM           Index       RIGHT ATRIUM           Index LA diam:      2.80 cm 1.45 cm/m  RA Area:     14.00 cm LA Vol (A2C): 66.0 ml 34.12 ml/m RA Volume:   34.10 ml  17.63 ml/m LA Vol (A4C): 26.9 ml 13.91 ml/m  AORTIC VALVE LVOT Vmax:   107.00 cm/s LVOT Vmean:  68.800 cm/s LVOT VTI:    0.236 m  AORTA Ao Root diam: 3.20 cm MITRAL VALVE               TRICUSPID VALVE MV Area (PHT): 3.03 cm    TR Peak grad:   32.9 mmHg MV Decel Time: 250 msec    TR Vmax:        287.00 cm/s MV E velocity: 91.20 cm/s MV A velocity: 73.20 cm/s  SHUNTS MV E/A ratio:  1.25        Systemic VTI:  0.24 m                            Systemic Diam: 2.20 cm Rozann Lesches MD Electronically signed by Rozann Lesches MD Signature Date/Time: 06/02/2020/2:46:38 PM    Final     Labs:  CBC: Recent Labs    06/03/20 0115 06/04/20 0630 06/05/20 0602 06/06/20 0046  WBC 18.3* 16.5* 11.1* 8.6  HGB  13.8 13.8 13.6 12.7*  HCT 42.1 42.4 41.7 38.6*  PLT 225 196 210 203    COAGS: Recent Labs    06/01/20 1950  INR 1.0  APTT 27    BMP: Recent Labs    06/01/20 1950 06/04/20 0630 06/05/20 0602 06/06/20 0046    NA 138 134* 134* 134*  K 3.8 3.8 3.3* 3.5  CL 100 94* 99 99  CO2 26 29 23 25   GLUCOSE 170* 102* 97 97  BUN 16 11 16 9   CALCIUM 9.4 9.0 8.9 8.4*  CREATININE 0.95 1.41* 1.14 1.04  GFRNONAA >60 50* >60 >60  GFRAA >60 58* >60 >60    LIVER FUNCTION TESTS: Recent Labs    01/27/20 1122 06/03/20 1033  BILITOT 0.3 1.0  AST 17 18  ALT 14 15  ALKPHOS 85 53  PROT 7.0 6.5  ALBUMIN 3.5 3.1*    Assessment and Plan:  69 y/o M s/p percutaneous cholecystostomy placement 06/05/20 in IR for acute calculus cholecystitis seen today for drain follow up.   150 cc output since placement per I/O, output appears cloudy bilious, flushes easily. Insertion site clean, dry, dressed. Appropriately tender at insertion site. Tolerating liquid diet so far. KUB this AM shows mildly dilated small bowel loops in LUQ concerning for possible ileus or SBO - CCS following.  Continue current drain management - patient will need to be seen as an outpatient at [facility] in 6-8 weeks for routine drain exchange due to presence of stones. Orders have been placed to facilitate this - IR scheduler will call patient with appointment date/time.  IR will continue to follow while admitted - please call with questions or concerns.  Electronically Signed: Joaquim Nam, PA-C 06/06/2020, 10:49 AM   I spent a total of 15 Minutes at the the patient's bedside AND on the patient's hospital floor or unit, greater than 50% of which was counseling/coordinating care for percutaneous cholecystostomy follow up.

## 2020-06-06 NOTE — Progress Notes (Signed)
Paged brooke meuth pa, regarding kub results, pt asking if may have diet again. Pt had to watery stools this am, abdomen not as distended as earlier

## 2020-06-06 NOTE — Progress Notes (Signed)
[facility] Progress Note     Subjective: CC-  S/p perc chole tube placement yesterday. States that last night he was feeling much better after tube was placed. He had multiple bouts of watery diarrhea last night. This morning he drank entire clear liquid tray. He feels very bloated, some nausea no emesis. Still passing some flatus.  WBC normalized 8.6, afebrile last 24 hours.  Objective: Vital signs in last 24 hours: Temp:  [98.1 F (36.7 C)-98.6 F (37 C)] 98.4 F (36.9 C) (09/01 0700) Pulse Rate:  [73-96] 75 (09/01 0700) Resp:  [17-34] 19 (09/01 0700) BP: (117-142)/(62-88) 132/70 (09/01 0700) SpO2:  [91 %-99 %] 91 % (09/01 0700) Weight:  [77.1 kg] 77.1 kg (09/01 0622) Last BM Date: 06/01/20  Intake/Output from previous day: 08/31 0701 - 09/01 0700 In: 1695 [P.O.:120; I.V.:1275; IV Piggyback:300] Out: 955 [Urine:830; Drains:125] Intake/Output this shift: No intake/output data recorded.  PE: Gen: Alert, NAD, pleasant HEENT: EOM's intact, pupils equal and round Card: RRR Pulm: rate and effort normal Abd: distended and somewhat firm, mild diffuse tenderness with more significant TTP RUQ around drain, drain with bilious fluid in bag, no peritonitis, hyperactive BS Skin: no rashes noted, warm and dry    Lab Results:  Recent Labs    06/05/20 0602 06/06/20 0046  WBC 11.1* 8.6  HGB 13.6 12.7*  HCT 41.7 38.6*  PLT 210 203   BMET Recent Labs    06/05/20 0602 06/06/20 0046  NA 134* 134*  K 3.3* 3.5  CL 99 99  CO2 23 25  GLUCOSE 97 97  BUN 16 9  CREATININE 1.14 1.04  CALCIUM 8.9 8.4*   PT/INR No results for input(s): LABPROT, INR in the last 72 hours. CMP     Component Value Date/Time   NA 134 (L) 06/06/2020 0046   K 3.5 06/06/2020 0046   CL 99 06/06/2020 0046   CO2 25 06/06/2020 0046   GLUCOSE 97 06/06/2020 0046   BUN 9 06/06/2020 0046   CREATININE 1.04 06/06/2020 0046   CREATININE 1.00 01/27/2020 1122   CREATININE 0.94 12/27/2019 1232    CALCIUM 8.4 (L) 06/06/2020 0046   PROT 6.5 06/03/2020 1033   ALBUMIN 3.1 (L) 06/03/2020 1033   AST 18 06/03/2020 1033   AST 17 01/27/2020 1122   ALT 15 06/03/2020 1033   ALT 14 01/27/2020 1122   ALKPHOS 53 06/03/2020 1033   BILITOT 1.0 06/03/2020 1033   BILITOT 0.3 01/27/2020 1122   GFRNONAA >60 06/06/2020 0046   GFRNONAA >60 01/27/2020 1122   GFRAA >60 06/06/2020 0046   GFRAA >60 01/27/2020 1122   Lipase     Component Value Date/Time   LIPASE 23 06/03/2020 1033       Studies/Results: NM Hepato W/EF  Result Date: 06/04/2020 CLINICAL DATA:  69 year old male with abdominal pain and cholelithiasis. EXAM: NUCLEAR MEDICINE HEPATOBILIARY IMAGING TECHNIQUE: Sequential images of the abdomen were obtained out to 60 minutes following intravenous administration of radiopharmaceutical. 3 mg of IV morphine was administered during the examination. RADIOPHARMACEUTICALS:  4.8 mCi Tc-34m Choletec IV COMPARISON:  06/03/2020 CT FINDINGS: Prompt uptake and biliary excretion of activity by the liver is seen. Biliary activity passes into small bowel, consistent with patent common bile duct. Gallbladder is not visualized on initial imaging and after morphine administration, compatible with acute cholecystitis. IMPRESSION: 1. Nonvisualized gallbladder compatible with acute cholecystitis. 2. Patent CBD. Electronically Signed   By: Margarette Canada M.D.   On: 06/04/2020 19:23   IR Perc  Cholecystostomy  Result Date: 06/05/2020 INDICATION: 69 year old male with acute calculus cholecystitis. He is currently not a surgical candidate and requires percutaneous cholecystostomy tube placement. EXAM: CHOLECYSTOSTOMY MEDICATIONS: In patient currently receiving intravenous antibiotic therapy. No additional prophylaxis administered. ANESTHESIA/SEDATION: Moderate (conscious) sedation was employed during this procedure. A total of Versed 2 mg and Fentanyl 75 mcg was administered intravenously. Moderate Sedation Time: 16  minutes. The patient's level of consciousness and vital signs were monitored continuously by radiology nursing throughout the procedure under my direct supervision. FLUOROSCOPY TIME:  Fluoroscopy Time: 0 minutes 54 seconds (2 mGy). COMPLICATIONS: None immediate. PROCEDURE: Informed written consent was obtained from the patient after a thorough discussion of the procedural risks, benefits and alternatives. All questions were addressed. Maximal Sterile Barrier Technique was utilized including caps, mask, sterile gowns, sterile gloves, sterile drape, hand hygiene and skin antiseptic. A timeout was performed prior to the initiation of the procedure. The right upper quadrant was interrogated with ultrasound. The colon was successfully identified. A suitable skin entry site lateral to the inter positioned: Was selected. Local anesthesia was attained by infiltration with 1% lidocaine. A small dermatotomy was made. Under real time ultrasound guidance, a 21 gauge Accustick needle was advanced along a short transhepatic course and used to puncture the gallbladder lumen. There was return of cloudy bile. A 0.018 inch wire was then coiled in the gallbladder lumen. The Accustick needle was exchanged for the transitional sheath which was advanced over the wire and into the gallbladder lumen. A hand injection of contrast material was performed opacifying the gallbladder lumen. Filling defects are present consistent with known cholelithiasis. A 0.035 wire was then coiled in the gallbladder lumen. The transhepatic tract was dilated to 10 [place] and a Cook 10.2 [place] all-purpose drainage catheter was advanced over the wire and formed. Aspiration yields 30 mL turbid bile which was sent for Gram stain and culture. The drainage catheter was gently flushed and connected to gravity bag for drainage. The catheter was secured to the skin with 0 Prolene suture and a sterile bandage was applied. The patient tolerated the procedure well.  IMPRESSION: Successful placement of a 10 French percutaneous transhepatic cholecystostomy tube for treatment of acute calculus cholecystitis. Electronically Signed   By: Jacqulynn Cadet M.D.   On: 06/05/2020 15:54    Anti-infectives: Anti-infectives (From admission, onward)   Start     Dose/Rate Route Frequency Ordered Stop   06/05/20 1200  Ampicillin-Sulbactam (UNASYN) 3 g in sodium chloride 0.9 % 100 mL IVPB        3 g 200 mL/hr over 30 Minutes Intravenous Every 6 hours 06/05/20 0930     06/04/20 0600  metroNIDAZOLE (FLAGYL) IVPB 500 mg  Status:  Discontinued        500 mg 100 mL/hr over 60 Minutes Intravenous Every 8 hours 06/04/20 0253 06/05/20 0930   06/04/20 0300  cefTRIAXone (ROCEPHIN) 2 g in sodium chloride 0.9 % 100 mL IVPB  Status:  Discontinued        2 g 200 mL/hr over 30 Minutes Intravenous Every 24 hours 06/04/20 0253 06/05/20 0930   06/03/20 1700  piperacillin-tazobactam (ZOSYN) IVPB 3.375 g  Status:  Discontinued        3.375 g 12.5 mL/hr over 240 Minutes Intravenous Every 8 hours 06/03/20 1630 06/04/20 0253       Assessment/Plan HTN GERD BPH DM - A1c 6 Chest pain, elevated troponin - initially felt be to 2/2 type II NSTEMI, now suspect demand ischemia, cardiology following and  does not recommend cardiac cath  Strep bacteremia - repeat Bcx pending Acute calculous cholecystitis  - HIDA + S/p perc chole 8/31 - culture pending  ID -zosyn 8/29>>8/30, rocephin/flagyl 8/30>>8/31, unasyn 8/31>> FEN -IVF, NPO VTE -SCDs, sq heparin Foley -none Follow up - IR, Dr. Ninfa Linden   Plan: Due to bloating will make patient NPO. Check abdominal film. If he becomes nauseated or vomits will need NG tube. Mobilize. Continue drain and antibiotics.    LOS: 5 days    Wellington Hampshire, [facility] 06/06/2020, 8:05 AM Please see Amion for pager number during day hours 7:00am-4:30pm

## 2020-06-06 NOTE — Plan of Care (Signed)

## 2020-06-06 NOTE — Progress Notes (Addendum)
PROGRESS NOTE    Phillip Kirk  JOA:[id number] DOB: 11/20/1950 DOA: 06/01/2020 PCP: Lavella Lemons, PA   Brief Narrative: Phillip Kirk is a 69 y.o. male with a history of hypertension. He presented secondary to chest pain, flank pain and chills found to have severe sepsis secondary to cholecystitis and bacteremia.    Assessment & Plan:   Principal Problem:   NSTEMI (non-ST elevated myocardial infarction) ([facility]) Active Problems:   Essential hypertension, benign   Acute renal failure (HCC)   GERD (gastroesophageal reflux disease)   BPH (benign prostatic hyperplasia)   Severe sepsis (HCC)   Acute cholecystitis   Elevated troponin   Chest pain EKG without acute ST segment changes and peak troponin of 263. Transthoracic Echocardiogram significant for normal EF and no wall motion abnormalities. Cardiology consulted with recommendation for no catheterization.  Severe sepsis Secondary to strep bacteremia and acute cholecystitis. Patient currently on Unasyn for treatment. Sepsis physiology resolved. -Continue Unasyn  Acute cholecystitis Drain placed by IR. General surgery on board with no recommendations for surgery at this time. Fluid culture pending -General surgery recommendations: clear liquid diet  Enterococcus faecalis bacteruria No urinary symptoms. Unknown significance but will be treated with above regimen.  Strep bacteremia Unasyn as mentioned above.  Essential hypertension On amlodipine as an outpatient -Continue amlodipine  GERD -Continue protonix  BPH -Continue tamsulosin  Hyperglycemia Possibly related to infection. Hemoglobin A1C of 6.0% which qualifies as pre-diabetes. Consider metformin as an outpatient and diet modification   DVT prophylaxis: Heparin subq Code Status:   Code Status: Full Code Family Communication: Wife at bedside Disposition Plan: Discharge home pending PT/General surgery recommendations   Consultants:   General  surgery  Cardiology  Interventional radiology  Procedures:   TRANSTHORACIC ECHOCARDIOGRAM (06/02/2020) IMPRESSIONS    1. Left ventricular ejection fraction, by estimation, is 55 to 60%. The  left ventricle has normal function. The left ventricle has no regional  wall motion abnormalities. There is mild left ventricular hypertrophy.  Left ventricular diastolic parameters  were normal.  2. Right ventricular systolic function is normal. The right ventricular  size is normal. There is normal pulmonary artery systolic pressure. The  estimated right ventricular systolic pressure is 60.1 mmHg.  3. The mitral valve is grossly normal. No evidence of mitral valve  regurgitation.  4. The aortic valve is tricuspid. Aortic valve regurgitation is mild.  5. The inferior vena cava is normal in size with greater than 50%  respiratory variability, suggesting right atrial pressure of 3 mmHg.    PERCUTANEOUS DRAIN PLACEMENT (06/05/2020)   Antimicrobials:  None   Subjective: Bowel movement this morning. Passing gas but scared he will have loose stools if he allows himself to pass gas  Objective: Vitals:   06/06/20 0400 06/06/20 0433 06/06/20 0622 06/06/20 0700  BP: (!) 141/68   132/70  Pulse: 75 82  75  Resp: 20 (!) 21  19  Temp:  98.1 F (36.7 C)  98.4 F (36.9 C)  TempSrc:  Oral  Oral  SpO2: 97% 92%  91%  Weight:   77.1 kg   Height:        Intake/Output Summary (Last 24 hours) at 06/06/2020 1053 Last data filed at 06/06/2020 0701 Gross per 24 hour  Intake 1545 ml  Output 980 ml  Net 565 ml   Filed Weights   06/03/20 0400 06/05/20 0554 06/06/20 0622  Weight: 77.1 kg 77.2 kg 77.1 kg    Examination:  General exam: Appears calm and  comfortable Respiratory system: Clear to auscultation. Respiratory effort normal. Cardiovascular system: S1 & S2 heard, RRR. No murmurs, rubs, gallops or clicks. Gastrointestinal system: Abdomen is distended, soft and nontender. No  organomegaly or masses felt. Normal bowel sounds heard. Drain bandages are clean/dry/intact Central nervous system: Alert and oriented. No focal neurological deficits. Musculoskeletal: No edema. No calf tenderness Skin: No cyanosis. No rashes Psychiatry: Judgement and insight appear normal. Mood & affect appropriate.     Data Reviewed: I have personally reviewed following labs and imaging studies  CBC Lab Results  Component Value Date   WBC 8.6 06/06/2020   RBC 4.37 06/06/2020   HGB 12.7 (L) 06/06/2020   HCT 38.6 (L) 06/06/2020   MCV 88.3 06/06/2020   MCH 29.1 06/06/2020   PLT 203 06/06/2020   MCHC 32.9 06/06/2020   RDW 13.2 06/06/2020   LYMPHSABS 1.7 01/27/2020   MONOABS 0.5 01/27/2020   EOSABS 0.1 01/27/2020   BASOSABS 0.0 25/02/3975     Last metabolic panel Lab Results  Component Value Date   NA 134 (L) 06/06/2020   K 3.5 06/06/2020   CL 99 06/06/2020   CO2 25 06/06/2020   BUN 9 06/06/2020   CREATININE 1.04 06/06/2020   GLUCOSE 97 06/06/2020   GFRNONAA >60 06/06/2020   GFRAA >60 06/06/2020   CALCIUM 8.4 (L) 06/06/2020   PROT 6.5 06/03/2020   ALBUMIN 3.1 (L) 06/03/2020   BILITOT 1.0 06/03/2020   ALKPHOS 53 06/03/2020   AST 18 06/03/2020   ALT 15 06/03/2020   ANIONGAP 10 06/06/2020    CBG (last 3)  Recent Labs    06/05/20 1559 06/05/20 2134 06/06/20 0618  GLUCAP 120* 95 94     GFR: Estimated Creatinine Clearance: 67 mL/min (by C-G formula based on SCr of 1.04 mg/dL).  Coagulation Profile: Recent Labs  Lab 06/01/20 1950  INR 1.0    Recent Results (from the past 240 hour(s))  SARS Coronavirus 2 by RT PCR (hospital order, performed in [facility] hospital lab) Nasopharyngeal Nasopharyngeal Swab     Status: None   Collection Time: 06/01/20  9:55 PM   Specimen: Nasopharyngeal Swab  Result Value Ref Range Status   SARS Coronavirus 2 NEGATIVE NEGATIVE Final    Comment: (NOTE) SARS-CoV-2 target nucleic acids are NOT DETECTED.  The SARS-CoV-2 RNA  is generally detectable in upper and lower respiratory specimens during the acute phase of infection. The lowest concentration of SARS-CoV-2 viral copies this assay can detect is 250 copies / mL. A negative result does not preclude SARS-CoV-2 infection and should not be used as the sole basis for treatment or other patient management decisions.  A negative result may occur with improper specimen collection / handling, submission of specimen other than nasopharyngeal swab, presence of viral mutation(s) within the areas targeted by this assay, and inadequate number of viral copies (<250 copies / mL). A negative result must be combined with clinical observations, patient history, and epidemiological information.  Fact Sheet for Patients:   StrictlyIdeas.no  Fact Sheet for Healthcare Providers: BankingDealers.co.za  This test is not yet approved or  cleared by the [place] FDA and has been authorized for detection and/or diagnosis of SARS-CoV-2 by FDA under an Emergency Use Authorization (EUA).  This EUA will remain in effect (meaning this test can be used) for the duration of the COVID-19 declaration under Section 564(b)(1) of the Act, 21 U.S.C. section 360bbb-3(b)(1), unless the authorization is terminated or revoked sooner.  Performed at [facility], [id number]  [address], [place], [place] [address]   MRSA PCR Screening     Status: None   Collection Time: 06/02/20  5:50 AM   Specimen: Nasal Mucosa; Nasopharyngeal  Result Value Ref Range Status   MRSA by PCR NEGATIVE NEGATIVE Final    Comment:        The GeneXpert MRSA Assay (FDA approved for NASAL specimens only), is one component of a comprehensive MRSA colonization surveillance program. It is not intended to diagnose MRSA infection nor to guide or monitor treatment for MRSA infections. Performed at [facility], [place] [address], [place], [place] [address]   Culture, Urine      Status: Abnormal   Collection Time: 06/03/20 10:13 AM   Specimen: Urine, Random  Result Value Ref Range Status   Specimen Description URINE, RANDOM  Final   Special Requests   Final    NONE Performed at [facility], [place] [address] [place], [place] [address]    Culture >=100,000 COLONIES/mL ENTEROCOCCUS FAECALIS (A)  Final   Report Status 06/05/2020 FINAL  Final   Organism ID, Bacteria ENTEROCOCCUS FAECALIS (A)  Final      Susceptibility   Enterococcus faecalis - MIC*    AMPICILLIN <=2 SENSITIVE Sensitive     NITROFURANTOIN <=16 SENSITIVE Sensitive     VANCOMYCIN 1 SENSITIVE Sensitive     * >=100,000 COLONIES/mL ENTEROCOCCUS FAECALIS  Culture, blood (Routine X 2) w Reflex to ID Panel     Status: Abnormal   Collection Time: 06/03/20 10:38 AM   Specimen: BLOOD  Result Value Ref Range Status   Specimen Description BLOOD LEFT ANTECUBITAL  Final   Special Requests   Final    BOTTLES DRAWN AEROBIC AND ANAEROBIC Blood Culture adequate volume   Culture  Setup Time   Final    GRAM POSITIVE COCCI IN PAIRS IN CHAINS IN BOTH AEROBIC AND ANAEROBIC BOTTLES CRITICAL RESULT CALLED TO, READ BACK BY AND VERIFIED WITH: J. LEDFORD,PHARMD [id number] 06/04/2020 Mena Goes Performed at [facility], [place] [address], [place],  [address]    Culture STREPTOCOCCUS GALLOLYTICUS (A)  Final   Report Status 06/05/2020 FINAL  Final   Organism ID, Bacteria STREPTOCOCCUS GALLOLYTICUS  Final      Susceptibility   Streptococcus gallolyticus - MIC*    PENICILLIN 0.12 SENSITIVE Sensitive     CEFTRIAXONE 0.25 SENSITIVE Sensitive     ERYTHROMYCIN <=0.12 SENSITIVE Sensitive     LEVOFLOXACIN 2 SENSITIVE Sensitive     VANCOMYCIN <=0.12 SENSITIVE Sensitive     * STREPTOCOCCUS GALLOLYTICUS  Blood Culture ID Panel (Reflexed)     Status: Abnormal   Collection Time: 06/03/20 10:38 AM  Result Value Ref Range Status   Enterococcus faecalis NOT DETECTED NOT DETECTED Final   Enterococcus Faecium NOT DETECTED  NOT DETECTED Final   Listeria monocytogenes NOT DETECTED NOT DETECTED Final   Staphylococcus species NOT DETECTED NOT DETECTED Final   Staphylococcus aureus (BCID) NOT DETECTED NOT DETECTED Final   Staphylococcus epidermidis NOT DETECTED NOT DETECTED Final   Staphylococcus lugdunensis NOT DETECTED NOT DETECTED Final   Streptococcus species DETECTED (A) NOT DETECTED Final    Comment: Not Enterococcus species, Streptococcus agalactiae, Streptococcus pyogenes, or Streptococcus pneumoniae. CRITICAL RESULT CALLED TO, READ BACK BY AND VERIFIED WITH: J. LEDFORD,PHARMD 0246 06/04/2020 T. TYSOR    Streptococcus agalactiae NOT DETECTED NOT DETECTED Final   Streptococcus pneumoniae NOT DETECTED NOT DETECTED Final   Streptococcus pyogenes NOT DETECTED NOT DETECTED Final   A.calcoaceticus-baumannii NOT  DETECTED NOT DETECTED Final   Bacteroides fragilis NOT DETECTED NOT DETECTED Final   Enterobacterales NOT DETECTED NOT DETECTED Final   Enterobacter cloacae complex NOT DETECTED NOT DETECTED Final   Escherichia coli NOT DETECTED NOT DETECTED Final   Klebsiella aerogenes NOT DETECTED NOT DETECTED Final   Klebsiella oxytoca NOT DETECTED NOT DETECTED Final   Klebsiella pneumoniae NOT DETECTED NOT DETECTED Final   Proteus species NOT DETECTED NOT DETECTED Final   Salmonella species NOT DETECTED NOT DETECTED Final   Serratia marcescens NOT DETECTED NOT DETECTED Final   Haemophilus influenzae NOT DETECTED NOT DETECTED Final   Neisseria meningitidis NOT DETECTED NOT DETECTED Final   Pseudomonas aeruginosa NOT DETECTED NOT DETECTED Final   Stenotrophomonas maltophilia NOT DETECTED NOT DETECTED Final   Candida albicans NOT DETECTED NOT DETECTED Final   Candida auris NOT DETECTED NOT DETECTED Final   Candida glabrata NOT DETECTED NOT DETECTED Final   Candida krusei NOT DETECTED NOT DETECTED Final   Candida parapsilosis NOT DETECTED NOT DETECTED Final   Candida tropicalis NOT DETECTED NOT DETECTED Final    Cryptococcus neoformans/gattii NOT DETECTED NOT DETECTED Final    Comment: Performed at [facility], [place] [address] [place], [place] [address]  Culture, blood (Routine X 2) w Reflex to ID Panel     Status: Abnormal   Collection Time: 06/03/20 10:45 AM   Specimen: BLOOD LEFT HAND  Result Value Ref Range Status   Specimen Description BLOOD LEFT HAND  Final   Special Requests   Final    BOTTLES DRAWN AEROBIC AND ANAEROBIC Blood Culture results may not be optimal due to an inadequate volume of blood received in culture bottles   Culture  Setup Time   Final    GRAM POSITIVE COCCI IN PAIRS IN CHAINS IN BOTH AEROBIC AND ANAEROBIC BOTTLES CRITICAL VALUE NOTED.  VALUE IS CONSISTENT WITH PREVIOUSLY REPORTED AND CALLED VALUE.    Culture (A)  Final    STREPTOCOCCUS GALLOLYTICUS SUSCEPTIBILITIES PERFORMED ON PREVIOUS CULTURE WITHIN THE LAST 5 DAYS. Performed at [facility], [place] [address] [place], [place] [address]    Report Status 06/05/2020 FINAL  Final  Culture, blood (Routine X 2) w Reflex to ID Panel     Status: None (Preliminary result)   Collection Time: 06/05/20  6:03 AM   Specimen: BLOOD RIGHT ARM  Result Value Ref Range Status   Specimen Description BLOOD RIGHT ARM  Final   Special Requests   Final    BOTTLES DRAWN AEROBIC AND ANAEROBIC Blood Culture adequate volume   Culture   Final    NO GROWTH 1 DAY Performed at [facility], [place] [address], [place], [place] [address]    Report Status PENDING  Incomplete  Aerobic/Anaerobic Culture (surgical/deep wound)     Status: None (Preliminary result)   Collection Time: 06/05/20  1:16 PM   Specimen: BILE  Result Value Ref Range Status   Specimen Description BILE  Final   Special Requests NONE  Final   Gram Stain   Final    FEW WBC PRESENT,BOTH PMN AND MONONUCLEAR MODERATE GRAM POSITIVE COCCI Performed at [facility], [address], [place], [place] [address]    Culture PENDING  Incomplete   Report Status  PENDING  Incomplete        Radiology Studies: NM Hepato W/EF  Result Date: 06/04/2020 CLINICAL DATA:  69 year old male with abdominal pain and cholelithiasis. EXAM: NUCLEAR MEDICINE HEPATOBILIARY IMAGING TECHNIQUE: Sequential images of  the abdomen were obtained out to 60 minutes following intravenous administration of radiopharmaceutical. 3 mg of IV morphine was administered during the examination. RADIOPHARMACEUTICALS:  4.8 mCi Tc-35m Choletec IV COMPARISON:  06/03/2020 CT FINDINGS: Prompt uptake and biliary excretion of activity by the liver is seen. Biliary activity passes into small bowel, consistent with patent common bile duct. Gallbladder is not visualized on initial imaging and after morphine administration, compatible with acute cholecystitis. IMPRESSION: 1. Nonvisualized gallbladder compatible with acute cholecystitis. 2. Patent CBD. Electronically Signed   By: Margarette Canada M.D.   On: 06/04/2020 19:23   IR Perc Cholecystostomy  Result Date: 06/05/2020 INDICATION: 69 year old male with acute calculus cholecystitis. He is currently not a surgical candidate and requires percutaneous cholecystostomy tube placement. EXAM: CHOLECYSTOSTOMY MEDICATIONS: In patient currently receiving intravenous antibiotic therapy. No additional prophylaxis administered. ANESTHESIA/SEDATION: Moderate (conscious) sedation was employed during this procedure. A total of Versed 2 mg and Fentanyl 75 mcg was administered intravenously. Moderate Sedation Time: 16 minutes. The patient's level of consciousness and vital signs were monitored continuously by radiology nursing throughout the procedure under my direct supervision. FLUOROSCOPY TIME:  Fluoroscopy Time: 0 minutes 54 seconds (2 mGy). COMPLICATIONS: None immediate. PROCEDURE: Informed written consent was obtained from the patient after a thorough discussion of the procedural risks, benefits and alternatives. All questions were addressed. Maximal Sterile Barrier  Technique was utilized including caps, mask, sterile gowns, sterile gloves, sterile drape, hand hygiene and skin antiseptic. A timeout was performed prior to the initiation of the procedure. The right upper quadrant was interrogated with ultrasound. The colon was successfully identified. A suitable skin entry site lateral to the inter positioned: Was selected. Local anesthesia was attained by infiltration with 1% lidocaine. A small dermatotomy was made. Under real time ultrasound guidance, a 21 gauge Accustick needle was advanced along a short transhepatic course and used to puncture the gallbladder lumen. There was return of cloudy bile. A 0.018 inch wire was then coiled in the gallbladder lumen. The Accustick needle was exchanged for the transitional sheath which was advanced over the wire and into the gallbladder lumen. A hand injection of contrast material was performed opacifying the gallbladder lumen. Filling defects are present consistent with known cholelithiasis. A 0.035 wire was then coiled in the gallbladder lumen. The transhepatic tract was dilated to 10 [place] and a Cook 10.2 [place] all-purpose drainage catheter was advanced over the wire and formed. Aspiration yields 30 mL turbid bile which was sent for Gram stain and culture. The drainage catheter was gently flushed and connected to gravity bag for drainage. The catheter was secured to the skin with 0 Prolene suture and a sterile bandage was applied. The patient tolerated the procedure well. IMPRESSION: Successful placement of a 10 French percutaneous transhepatic cholecystostomy tube for treatment of acute calculus cholecystitis. Electronically Signed   By: Jacqulynn Cadet M.D.   On: 06/05/2020 15:54   DG Abd Portable 1V  Result Date: 06/06/2020 CLINICAL DATA:  Abdominal distension. EXAM: PORTABLE ABDOMEN - 1 VIEW COMPARISON:  None. FINDINGS: Mildly dilated small bowel loops are noted in the left upper quadrant concerning for ileus or possibly  distal small bowel obstruction. No significant colonic dilatation is noted. Pigtail drainage catheter is noted in the right upper quadrant. IMPRESSION: Mildly dilated small bowel loops are noted in left upper quadrant concerning for ileus or possibly distal small bowel obstruction. Follow-up radiographs are recommended. Electronically Signed   By: Marijo Conception M.D.   On: 06/06/2020 09:02  Scheduled Meds: . amLODipine  5 mg Oral Daily  . aspirin EC  81 mg Oral Daily  . atorvastatin  80 mg Oral Daily  . diclofenac Sodium  2 g Topical QID  . heparin injection (subcutaneous)  5,000 Units Subcutaneous Q8H  . insulin aspart  0-6 Units Subcutaneous TID WC  . metoprolol tartrate  12.5 mg Oral BID  . montelukast  10 mg Oral Daily  . pantoprazole  40 mg Oral Daily  . sodium chloride flush  3 mL Intravenous Q12H  . sodium chloride flush  3 mL Intravenous Q12H  . tamsulosin  0.4 mg Oral QPC breakfast  . zolpidem  5 mg Oral QHS   Continuous Infusions: . sodium chloride    . sodium chloride 50 mL/hr at 06/06/20 0600  . ampicillin-sulbactam (UNASYN) IV 3 g (06/06/20 0427)     LOS: 5 days     Cordelia Poche, MD [facility] 06/06/2020, 10:53 AM  If 7PM-7AM, please contact night-coverage [email or web address]

## 2020-06-06 NOTE — Progress Notes (Signed)
Progress Note  Patient Name: Phillip Kirk Date of Encounter: 06/06/2020  [facility] Cardiologist: Dr. Domenic Polite (Former Bronson Ing)  Subjective   No chest pain.   Inpatient Medications    Scheduled Meds:  amLODipine  5 mg Oral Daily   aspirin EC  81 mg Oral Daily   atorvastatin  80 mg Oral Daily   diclofenac Sodium  2 g Topical QID   heparin injection (subcutaneous)  5,000 Units Subcutaneous Q8H   insulin aspart  0-6 Units Subcutaneous TID WC   metoprolol tartrate  12.5 mg Oral BID   montelukast  10 mg Oral Daily   pantoprazole  40 mg Oral Daily   sodium chloride flush  3 mL Intravenous Q12H   sodium chloride flush  3 mL Intravenous Q12H   tamsulosin  0.4 mg Oral QPC breakfast   zolpidem  5 mg Oral QHS   Continuous Infusions:  sodium chloride     sodium chloride 50 mL/hr at 06/06/20 0600   ampicillin-sulbactam (UNASYN) IV 3 g (06/06/20 0427)   PRN Meds: sodium chloride, acetaminophen, bisacodyl, iohexol, morphine injection, nitroGLYCERIN, ondansetron **OR** ondansetron (ZOFRAN) IV, polyethylene glycol, sodium chloride flush   Vital Signs    Vitals:   06/06/20 0400 06/06/20 0433 06/06/20 0622 06/06/20 0700  BP: (!) 141/68   132/70  Pulse: 75 82  75  Resp: 20 (!) 21  19  Temp:  98.1 F (36.7 C)  98.4 F (36.9 C)  TempSrc:  Oral  Oral  SpO2: 97% 92%  91%  Weight:   77.1 kg   Height:        Intake/Output Summary (Last 24 hours) at 06/06/2020 0931 Last data filed at 06/06/2020 8841 Gross per 24 hour  Intake 1620 ml  Output 955 ml  Net 665 ml   Last 3 Weights 06/06/2020 06/05/2020 06/03/2020  Weight (lbs) 169 lb 15.6 oz 170 lb 3.1 oz 169 lb 15.6 oz  Weight (kg) 77.1 kg 77.2 kg 77.1 kg      Telemetry   Sinus- Personally Reviewed  ECG    No new tracing   Physical Exam   General: Well developed, well nourished, NAD  HEENT: OP clear, mucus membranes moist  SKIN: warm, dry. No rashes. Neuro: No focal deficits  Musculoskeletal: Muscle  strength 5/5 all ext  Psychiatric: Mood and affect normal  Neck: No JVD, no carotid bruits, no thyromegaly, no lymphadenopathy.  Lungs:Clear bilaterally, no wheezes, rhonci, crackles Cardiovascular: Regular rate and rhythm. No murmurs, gallops or rubs. Abdomen:Soft. Bowel sounds present. Non-tender.  Extremities: No lower extremity edema. Pulses are 2 + in the bilateral DP/PT.  Labs    High Sensitivity Troponin:   Recent Labs  Lab 06/01/20 1950 06/01/20 2139 06/02/20 0201 06/02/20 0710  TROPONINIHS 256* 244* 241* 263*      Chemistry Recent Labs  Lab 06/01/20 1950 06/03/20 1033 06/04/20 0630 06/05/20 0602 06/06/20 0046  NA   < >  --  134* 134* 134*  K   < >  --  3.8 3.3* 3.5  CL   < >  --  94* 99 99  CO2   < >  --  29 23 25   GLUCOSE   < >  --  102* 97 97  BUN   < >  --  11 16 9   CREATININE   < >  --  1.41* 1.14 1.04  CALCIUM   < >  --  9.0 8.9 8.4*  PROT  --  6.5  --   --   --  ALBUMIN  --  3.1*  --   --   --   AST  --  18  --   --   --   ALT  --  15  --   --   --   ALKPHOS  --  53  --   --   --   BILITOT  --  1.0  --   --   --   GFRNONAA   < >  --  50* >60 >60  GFRAA   < >  --  58* >60 >60  ANIONGAP   < >  --  11 12 10    < > = values in this interval not displayed.     Hematology Recent Labs  Lab 06/04/20 0630 06/05/20 0602 06/06/20 0046  WBC 16.5* 11.1* 8.6  RBC 4.81 4.76 4.37  HGB 13.8 13.6 12.7*  HCT 42.4 41.7 38.6*  MCV 88.1 87.6 88.3  MCH 28.7 28.6 29.1  MCHC 32.5 32.6 32.9  RDW 13.0 13.1 13.2  PLT 196 210 203    Radiology    NM Hepato W/EF  Result Date: 06/04/2020 CLINICAL DATA:  69 year old male with abdominal pain and cholelithiasis. EXAM: NUCLEAR MEDICINE HEPATOBILIARY IMAGING TECHNIQUE: Sequential images of the abdomen were obtained out to 60 minutes following intravenous administration of radiopharmaceutical. 3 mg of IV morphine was administered during the examination. RADIOPHARMACEUTICALS:  4.8 mCi Tc-18m Choletec IV COMPARISON:   06/03/2020 CT FINDINGS: Prompt uptake and biliary excretion of activity by the liver is seen. Biliary activity passes into small bowel, consistent with patent common bile duct. Gallbladder is not visualized on initial imaging and after morphine administration, compatible with acute cholecystitis. IMPRESSION: 1. Nonvisualized gallbladder compatible with acute cholecystitis. 2. Patent CBD. Electronically Signed   By: Margarette Canada M.D.   On: 06/04/2020 19:23   IR Perc Cholecystostomy  Result Date: 06/05/2020 INDICATION: 69 year old male with acute calculus cholecystitis. He is currently not a surgical candidate and requires percutaneous cholecystostomy tube placement. EXAM: CHOLECYSTOSTOMY MEDICATIONS: In patient currently receiving intravenous antibiotic therapy. No additional prophylaxis administered. ANESTHESIA/SEDATION: Moderate (conscious) sedation was employed during this procedure. A total of Versed 2 mg and Fentanyl 75 mcg was administered intravenously. Moderate Sedation Time: 16 minutes. The patient's level of consciousness and vital signs were monitored continuously by radiology nursing throughout the procedure under my direct supervision. FLUOROSCOPY TIME:  Fluoroscopy Time: 0 minutes 54 seconds (2 mGy). COMPLICATIONS: None immediate. PROCEDURE: Informed written consent was obtained from the patient after a thorough discussion of the procedural risks, benefits and alternatives. All questions were addressed. Maximal Sterile Barrier Technique was utilized including caps, mask, sterile gowns, sterile gloves, sterile drape, hand hygiene and skin antiseptic. A timeout was performed prior to the initiation of the procedure. The right upper quadrant was interrogated with ultrasound. The colon was successfully identified. A suitable skin entry site lateral to the inter positioned: Was selected. Local anesthesia was attained by infiltration with 1% lidocaine. A small dermatotomy was made. Under real time  ultrasound guidance, a 21 gauge Accustick needle was advanced along a short transhepatic course and used to puncture the gallbladder lumen. There was return of cloudy bile. A 0.018 inch wire was then coiled in the gallbladder lumen. The Accustick needle was exchanged for the transitional sheath which was advanced over the wire and into the gallbladder lumen. A hand injection of contrast material was performed opacifying the gallbladder lumen. Filling defects are present consistent with known cholelithiasis. A 0.035 wire was then  coiled in the gallbladder lumen. The transhepatic tract was dilated to 10 [place] and a Cook 10.2 [place] all-purpose drainage catheter was advanced over the wire and formed. Aspiration yields 30 mL turbid bile which was sent for Gram stain and culture. The drainage catheter was gently flushed and connected to gravity bag for drainage. The catheter was secured to the skin with 0 Prolene suture and a sterile bandage was applied. The patient tolerated the procedure well. IMPRESSION: Successful placement of a 10 French percutaneous transhepatic cholecystostomy tube for treatment of acute calculus cholecystitis. Electronically Signed   By: Jacqulynn Cadet M.D.   On: 06/05/2020 15:54   DG Abd Portable 1V  Result Date: 06/06/2020 CLINICAL DATA:  Abdominal distension. EXAM: PORTABLE ABDOMEN - 1 VIEW COMPARISON:  None. FINDINGS: Mildly dilated small bowel loops are noted in the left upper quadrant concerning for ileus or possibly distal small bowel obstruction. No significant colonic dilatation is noted. Pigtail drainage catheter is noted in the right upper quadrant. IMPRESSION: Mildly dilated small bowel loops are noted in left upper quadrant concerning for ileus or possibly distal small bowel obstruction. Follow-up radiographs are recommended. Electronically Signed   By: Marijo Conception M.D.   On: 06/06/2020 09:02    Cardiac Studies   Echo 06/02/20 . Left ventricular ejection fraction, by  estimation, is 55 to 60%. The  left ventricle has normal function. The left ventricle has no regional  wall motion abnormalities. There is mild left ventricular hypertrophy.  Left ventricular diastolic parameters  were normal.  2. Right ventricular systolic function is normal. The right ventricular  size is normal. There is normal pulmonary artery systolic pressure. The  estimated right ventricular systolic pressure is 41.6 mmHg.  3. The mitral valve is grossly normal. No evidence of mitral valve  regurgitation.  4. The aortic valve is tricuspid. Aortic valve regurgitation is mild.  5. The inferior vena cava is normal in size with greater than 50%  respiratory variability, suggesting right atrial pressure of 3 mmHg.   Patient Profile        69 y.o. male with a history of hypertension, GERD, BPH, recurrent UTI, and previous urosepsis/bacteremia admitted with chest and abdominal pain.   Previous Myoview in 2019 revealed mild inferior ischemia that was managed medically  Assessment & Plan    1.Chest pain/ Elevated troponin: Mild troponin elevation in setting of fever/sepsis. LV function is normal. No recurrent chest pain. Elevated troponin felt to be due to demand ischemia.   2. Acute cholecystitis: s/p drain placement yesterday per IR.   Cardiology will sign off. Please call with questions.      For questions or updates, please contact [place] Please consult [email or web address] for contact info under        Signed, Lauree Chandler, MD  06/06/2020, 9:31 AM

## 2020-06-07 ENCOUNTER — Inpatient Hospital Stay (HOSPITAL_COMMUNITY): Payer: Medicare Other

## 2020-06-07 ENCOUNTER — Encounter (HOSPITAL_COMMUNITY): Payer: Self-pay | Admitting: Radiology

## 2020-06-07 HISTORY — PX: IR PATIENT EVAL TECH 0-60 MINS: IMG5564

## 2020-06-07 LAB — BASIC METABOLIC PANEL
Anion gap: 8 (ref 5–15)
BUN: 8 mg/dL (ref 8–23)
CO2: 26 mmol/L (ref 22–32)
Calcium: 8.5 mg/dL — ABNORMAL LOW (ref 8.9–10.3)
Chloride: 102 mmol/L (ref 98–111)
Creatinine, Ser: 0.91 mg/dL (ref 0.61–1.24)
GFR calc Af Amer: >60 mL/min (ref >60)
GFR calc non Af Amer: >60 mL/min (ref >60)
Glucose, Bld: 93 mg/dL (ref 70–99)
Potassium: 3.2 mmol/L — ABNORMAL LOW (ref 3.5–5.1)
Sodium: 136 mmol/L (ref 135–145)

## 2020-06-07 LAB — CBC
HCT: 38.5 % — ABNORMAL LOW (ref 39.0–52.0)
Hemoglobin: 12.6 g/dL — ABNORMAL LOW (ref 13.0–17.0)
MCH: 28.4 pg (ref 26.0–34.0)
MCHC: 32.7 g/dL (ref 30.0–36.0)
MCV: 86.7 fL (ref 80.0–100.0)
Platelets: 209 10*3/uL (ref 150–400)
RBC: 4.44 MIL/uL (ref 4.22–5.81)
RDW: 13.2 % (ref 11.5–15.5)
WBC: 6.7 10*3/uL (ref 4.0–10.5)
nRBC: 0 % (ref 0.0–0.2)

## 2020-06-07 LAB — GLUCOSE, CAPILLARY
Glucose-Capillary: 85 mg/dL (ref 70–99)
Glucose-Capillary: 100 mg/dL — ABNORMAL HIGH (ref 70–99)
Glucose-Capillary: 102 mg/dL — ABNORMAL HIGH (ref 70–99)
Glucose-Capillary: 108 mg/dL — ABNORMAL HIGH (ref 70–99)

## 2020-06-07 LAB — MAGNESIUM: Magnesium: 1.9 mg/dL (ref 1.7–2.4)

## 2020-06-07 MED ORDER — MORPHINE SULFATE (PF) 2 MG/ML IV SOLN: 2.0000 mg | INTRAVENOUS | Status: DC | PRN

## 2020-06-07 MED ORDER — POTASSIUM CHLORIDE CRYS ER 20 MEQ PO TBCR
40.0000 meq | EXTENDED_RELEASE_TABLET | Freq: Once | ORAL | Status: AC
  Administered 2020-06-07: 40 meq via ORAL
  Filled 2020-06-07: qty 2

## 2020-06-07 MED ORDER — OXYCODONE HCL 5 MG PO TABS
5.0000 mg | ORAL_TABLET | ORAL | Status: DC | PRN
  Administered 2020-06-07: 10 mg via ORAL
  Administered 2020-06-07 – 2020-06-08 (×2): 5 mg via ORAL
  Filled 2020-06-07: qty 2
  Filled 2020-06-07: qty 1
  Filled 2020-06-07: qty 2

## 2020-06-07 NOTE — Progress Notes (Signed)
PROGRESS NOTE    Phillip Kirk  ZCH:[id number] DOB: [birth year] DOA: 06/01/2020 PCP: Lavella Lemons, PA   Brief Narrative: Phillip Kirk is a 69 y.o. male with a history of hypertension. He presented secondary to chest pain, flank pain and chills found to have severe sepsis secondary to cholecystitis and bacteremia.    Assessment & Plan:   Principal Problem:   NSTEMI (non-ST elevated myocardial infarction) ([place]) Active Problems:   Essential hypertension, benign   Acute renal failure (HCC)   GERD (gastroesophageal reflux disease)   BPH (benign prostatic hyperplasia)   Severe sepsis (HCC)   Acute cholecystitis   Elevated troponin   Chest pain EKG without acute ST segment changes and peak troponin of 263. Transthoracic Echocardiogram significant for normal EF and no wall motion abnormalities. Cardiology consulted with recommendation for no catheterization.  Severe sepsis Secondary to strep bacteremia and acute cholecystitis. Patient currently on Unasyn for treatment. Sepsis physiology resolved. -Continue Unasyn, transition to Augmentin in AM  Acute cholecystitis Drain placed by IR. General surgery on board with no recommendations for surgery at this time. Fluid culture pending -General surgery recommendations: Full liquid diet  Enterococcus faecalis bacteruria No urinary symptoms. Unknown significance but will be treated with above regimen.  Strep bacteremia Unasyn as mentioned above.  Essential hypertension On amlodipine as an outpatient -Continue amlodipine  GERD -Continue protonix  BPH -Continue tamsulosin  Hyperglycemia Possibly related to infection. Hemoglobin A1C of 6.0% which qualifies as pre-diabetes. Consider metformin as an outpatient and diet modification   DVT prophylaxis: Heparin subq Code Status:   Code Status: Full Code Family Communication: Wife at bedside Disposition Plan: Discharge home likely in 24 hours pending PT/General surgery  recommendations   Consultants:   General surgery  Cardiology  Interventional radiology  Procedures:   TRANSTHORACIC ECHOCARDIOGRAM (06/02/2020) IMPRESSIONS    1. Left ventricular ejection fraction, by estimation, is 55 to 60%. The  left ventricle has normal function. The left ventricle has no regional  wall motion abnormalities. There is mild left ventricular hypertrophy.  Left ventricular diastolic parameters  were normal.  2. Right ventricular systolic function is normal. The right ventricular  size is normal. There is normal pulmonary artery systolic pressure. The  estimated right ventricular systolic pressure is 28.7 mmHg.  3. The mitral valve is grossly normal. No evidence of mitral valve  regurgitation.  4. The aortic valve is tricuspid. Aortic valve regurgitation is mild.  5. The inferior vena cava is normal in size with greater than 50%  respiratory variability, suggesting right atrial pressure of 3 mmHg.    PERCUTANEOUS DRAIN PLACEMENT (06/05/2020)   Antimicrobials:  None   Subjective: Having bowel movements. Tolerating diet. Ambulating well.  Objective: Vitals:   06/07/20 0339 06/07/20 0610 06/07/20 0730 06/07/20 1125  BP: 129/72  (!) 151/75 139/72  Pulse: 73  79 69  Resp: 18  19 17   Temp: 98.4 F (36.9 C)  97.8 F (36.6 C) (!) 97.3 F (36.3 C)  TempSrc: Oral  Oral Oral  SpO2: 93%  95% 94%  Weight:  75.7 kg    Height:        Intake/Output Summary (Last 24 hours) at 06/07/2020 1350 Last data filed at 06/07/2020 0730 Gross per 24 hour  Intake 922.5 ml  Output 85 ml  Net 837.5 ml   Filed Weights   06/05/20 0554 06/06/20 0622 06/07/20 0610  Weight: 77.2 kg 77.1 kg 75.7 kg    Examination:  General exam: Appears calm and comfortable  Respiratory system: Clear to auscultation. Respiratory effort normal. Cardiovascular system: S1 & S2 heard, RRR. No murmurs, rubs, gallops or clicks. Gastrointestinal system: Abdomen is minimally distended,  soft and nontender. No organomegaly or masses felt. Normal bowel sounds heard. Central nervous system: Alert and oriented. No focal neurological deficits. Musculoskeletal: No edema. No calf tenderness Skin: No cyanosis. No rashes Psychiatry: Judgement and insight appear normal. Mood & affect appropriate.      Data Reviewed: I have personally reviewed following labs and imaging studies  CBC Lab Results  Component Value Date   WBC 6.7 06/07/2020   RBC 4.44 06/07/2020   HGB 12.6 (L) 06/07/2020   HCT 38.5 (L) 06/07/2020   MCV 86.7 06/07/2020   MCH 28.4 06/07/2020   PLT 209 06/07/2020   MCHC 32.7 06/07/2020   RDW 13.2 06/07/2020   LYMPHSABS 1.7 01/27/2020   MONOABS 0.5 01/27/2020   EOSABS 0.1 01/27/2020   BASOSABS 0.0 75/17/0017     Last metabolic panel Lab Results  Component Value Date   NA 136 06/07/2020   K 3.2 (L) 06/07/2020   CL 102 06/07/2020   CO2 26 06/07/2020   BUN 8 06/07/2020   CREATININE 0.91 06/07/2020   GLUCOSE 93 06/07/2020   GFRNONAA >60 06/07/2020   GFRAA >60 06/07/2020   CALCIUM 8.5 (L) 06/07/2020   PROT 6.5 06/03/2020   ALBUMIN 3.1 (L) 06/03/2020   BILITOT 1.0 06/03/2020   ALKPHOS 53 06/03/2020   AST 18 06/03/2020   ALT 15 06/03/2020   ANIONGAP 8 06/07/2020    CBG (last 3)  Recent Labs    06/06/20 2108 06/07/20 0632 06/07/20 1122  GLUCAP 98 85 100*     GFR: Estimated Creatinine Clearance: 76.6 mL/min (by C-G formula based on SCr of 0.91 mg/dL).  Coagulation Profile: Recent Labs  Lab 06/01/20 1950  INR 1.0    Recent Results (from the past 240 hour(s))  SARS Coronavirus 2 by RT PCR (hospital order, performed in [facility] hospital lab) Nasopharyngeal Nasopharyngeal Swab     Status: None   Collection Time: 06/01/20  9:55 PM   Specimen: Nasopharyngeal Swab  Result Value Ref Range Status   SARS Coronavirus 2 NEGATIVE NEGATIVE Final    Comment: (NOTE) SARS-CoV-2 target nucleic acids are NOT DETECTED.  The SARS-CoV-2 RNA is  generally detectable in upper and lower respiratory specimens during the acute phase of infection. The lowest concentration of SARS-CoV-2 viral copies this assay can detect is 250 copies / mL. A negative result does not preclude SARS-CoV-2 infection and should not be used as the sole basis for treatment or other patient management decisions.  A negative result may occur with improper specimen collection / handling, submission of specimen other than nasopharyngeal swab, presence of viral mutation(s) within the areas targeted by this assay, and inadequate number of viral copies (<250 copies / mL). A negative result must be combined with clinical observations, patient history, and epidemiological information.  Fact Sheet for Patients:   StrictlyIdeas.no  Fact Sheet for Healthcare Providers: BankingDealers.co.za  This test is not yet approved or  cleared by the [place] FDA and has been authorized for detection and/or diagnosis of SARS-CoV-2 by FDA under an Emergency Use Authorization (EUA).  This EUA will remain in effect (meaning this test can be used) for the duration of the COVID-19 declaration under Section 564(b)(1) of the Act, 21 U.S.C. section 360bbb-3(b)(1), unless the authorization is terminated or revoked sooner.  Performed at [facility], [address], [place],  [place] [address]   MRSA PCR Screening     Status: None   Collection Time: 06/02/20  5:50 AM   Specimen: Nasal Mucosa; Nasopharyngeal  Result Value Ref Range Status   MRSA by PCR NEGATIVE NEGATIVE Final    Comment:        The GeneXpert MRSA Assay (FDA approved for NASAL specimens only), is one component of a comprehensive MRSA colonization surveillance program. It is not intended to diagnose MRSA infection nor to guide or monitor treatment for MRSA infections. Performed at [facility], [place] [address], [place], [place] [address]   Culture, Urine      Status: Abnormal   Collection Time: 06/03/20 10:13 AM   Specimen: Urine, Random  Result Value Ref Range Status   Specimen Description URINE, RANDOM  Final   Special Requests   Final    NONE Performed at [facility], [place] [address] [place], [place] [address]    Culture >=100,000 COLONIES/mL ENTEROCOCCUS FAECALIS (A)  Final   Report Status 06/05/2020 FINAL  Final   Organism ID, Bacteria ENTEROCOCCUS FAECALIS (A)  Final      Susceptibility   Enterococcus faecalis - MIC*    AMPICILLIN <=2 SENSITIVE Sensitive     NITROFURANTOIN <=16 SENSITIVE Sensitive     VANCOMYCIN 1 SENSITIVE Sensitive     * >=100,000 COLONIES/mL ENTEROCOCCUS FAECALIS  Culture, blood (Routine X 2) w Reflex to ID Panel     Status: Abnormal   Collection Time: 06/03/20 10:38 AM   Specimen: BLOOD  Result Value Ref Range Status   Specimen Description BLOOD LEFT ANTECUBITAL  Final   Special Requests   Final    BOTTLES DRAWN AEROBIC AND ANAEROBIC Blood Culture adequate volume   Culture  Setup Time   Final    GRAM POSITIVE COCCI IN PAIRS IN CHAINS IN BOTH AEROBIC AND ANAEROBIC BOTTLES CRITICAL RESULT CALLED TO, READ BACK BY AND VERIFIED WITH: J. LEDFORD,PHARMD 0246 06/04/2020 Mena Goes Performed at [facility], [place] [address] [place], [place] [address]    Culture STREPTOCOCCUS GALLOLYTICUS (A)  Final   Report Status 06/05/2020 FINAL  Final   Organism ID, Bacteria STREPTOCOCCUS GALLOLYTICUS  Final      Susceptibility   Streptococcus gallolyticus - MIC*    PENICILLIN 0.12 SENSITIVE Sensitive     CEFTRIAXONE 0.25 SENSITIVE Sensitive     ERYTHROMYCIN <=0.12 SENSITIVE Sensitive     LEVOFLOXACIN 2 SENSITIVE Sensitive     VANCOMYCIN <=0.12 SENSITIVE Sensitive     * STREPTOCOCCUS GALLOLYTICUS  Blood Culture ID Panel (Reflexed)     Status: Abnormal   Collection Time: 06/03/20 10:38 AM  Result Value Ref Range Status   Enterococcus faecalis NOT DETECTED NOT DETECTED Final   Enterococcus Faecium NOT DETECTED  NOT DETECTED Final   Listeria monocytogenes NOT DETECTED NOT DETECTED Final   Staphylococcus species NOT DETECTED NOT DETECTED Final   Staphylococcus aureus (BCID) NOT DETECTED NOT DETECTED Final   Staphylococcus epidermidis NOT DETECTED NOT DETECTED Final   Staphylococcus lugdunensis NOT DETECTED NOT DETECTED Final   Streptococcus species DETECTED (A) NOT DETECTED Final    Comment: Not Enterococcus species, Streptococcus agalactiae, Streptococcus pyogenes, or Streptococcus pneumoniae. CRITICAL RESULT CALLED TO, READ BACK BY AND VERIFIED WITH: J. LEDFORD,PHARMD 0246 06/04/2020 T. TYSOR    Streptococcus agalactiae NOT DETECTED NOT DETECTED Final   Streptococcus pneumoniae NOT DETECTED NOT DETECTED Final   Streptococcus pyogenes NOT DETECTED NOT DETECTED Final   A.calcoaceticus-baumannii NOT DETECTED NOT DETECTED  Final   Bacteroides fragilis NOT DETECTED NOT DETECTED Final   Enterobacterales NOT DETECTED NOT DETECTED Final   Enterobacter cloacae complex NOT DETECTED NOT DETECTED Final   Escherichia coli NOT DETECTED NOT DETECTED Final   Klebsiella aerogenes NOT DETECTED NOT DETECTED Final   Klebsiella oxytoca NOT DETECTED NOT DETECTED Final   Klebsiella pneumoniae NOT DETECTED NOT DETECTED Final   Proteus species NOT DETECTED NOT DETECTED Final   Salmonella species NOT DETECTED NOT DETECTED Final   Serratia marcescens NOT DETECTED NOT DETECTED Final   Haemophilus influenzae NOT DETECTED NOT DETECTED Final   Neisseria meningitidis NOT DETECTED NOT DETECTED Final   Pseudomonas aeruginosa NOT DETECTED NOT DETECTED Final   Stenotrophomonas maltophilia NOT DETECTED NOT DETECTED Final   Candida albicans NOT DETECTED NOT DETECTED Final   Candida auris NOT DETECTED NOT DETECTED Final   Candida glabrata NOT DETECTED NOT DETECTED Final   Candida krusei NOT DETECTED NOT DETECTED Final   Candida parapsilosis NOT DETECTED NOT DETECTED Final   Candida tropicalis NOT DETECTED NOT DETECTED Final    Cryptococcus neoformans/gattii NOT DETECTED NOT DETECTED Final    Comment: Performed at [facility], [place] [address] [place], [place] [address]  Culture, blood (Routine X 2) w Reflex to ID Panel     Status: Abnormal   Collection Time: 06/03/20 10:45 AM   Specimen: BLOOD LEFT HAND  Result Value Ref Range Status   Specimen Description BLOOD LEFT HAND  Final   Special Requests   Final    BOTTLES DRAWN AEROBIC AND ANAEROBIC Blood Culture results may not be optimal due to an inadequate volume of blood received in culture bottles   Culture  Setup Time   Final    GRAM POSITIVE COCCI IN PAIRS IN CHAINS IN BOTH AEROBIC AND ANAEROBIC BOTTLES CRITICAL VALUE NOTED.  VALUE IS CONSISTENT WITH PREVIOUSLY REPORTED AND CALLED VALUE.    Culture (A)  Final    STREPTOCOCCUS GALLOLYTICUS SUSCEPTIBILITIES PERFORMED ON PREVIOUS CULTURE WITHIN THE LAST 5 DAYS. Performed at [facility], [place] [address], [place], [place] [address]    Report Status 06/05/2020 FINAL  Final  Culture, blood (Routine X 2) w Reflex to ID Panel     Status: None (Preliminary result)   Collection Time: 06/05/20  6:03 AM   Specimen: BLOOD RIGHT ARM  Result Value Ref Range Status   Specimen Description BLOOD RIGHT ARM  Final   Special Requests   Final    BOTTLES DRAWN AEROBIC AND ANAEROBIC Blood Culture adequate volume   Culture   Final    NO GROWTH 2 DAYS Performed at [facility], [place] [address], [place], [place] [address]    Report Status PENDING  Incomplete  Aerobic/Anaerobic Culture (surgical/deep wound)     Status: None (Preliminary result)   Collection Time: 06/05/20  1:16 PM   Specimen: BILE  Result Value Ref Range Status   Specimen Description BILE  Final   Special Requests NONE  Final   Gram Stain   Final    FEW WBC PRESENT,BOTH PMN AND MONONUCLEAR MODERATE GRAM POSITIVE COCCI Performed at [facility], [address] [place],  [address]    Culture FEW STREPTOCOCCUS GALLOLYTICUS   Final   Report Status PENDING  Incomplete   Organism ID, Bacteria STREPTOCOCCUS GALLOLYTICUS  Final      Susceptibility   Streptococcus gallolyticus - MIC*    PENICILLIN 0.12 SENSITIVE Sensitive     CEFTRIAXONE <=0.12 SENSITIVE Sensitive  ERYTHROMYCIN <=0.12 SENSITIVE Sensitive     LEVOFLOXACIN 2 SENSITIVE Sensitive     VANCOMYCIN <=0.12 SENSITIVE Sensitive     * FEW STREPTOCOCCUS GALLOLYTICUS        Radiology Studies: DG Abd Portable 1V  Result Date: 06/06/2020 CLINICAL DATA:  Abdominal distension. EXAM: PORTABLE ABDOMEN - 1 VIEW COMPARISON:  None. FINDINGS: Mildly dilated small bowel loops are noted in the left upper quadrant concerning for ileus or possibly distal small bowel obstruction. No significant colonic dilatation is noted. Pigtail drainage catheter is noted in the right upper quadrant. IMPRESSION: Mildly dilated small bowel loops are noted in left upper quadrant concerning for ileus or possibly distal small bowel obstruction. Follow-up radiographs are recommended. Electronically Signed   By: Marijo Conception M.D.   On: 06/06/2020 09:02   IR PATIENT EVAL TECH 0-60 MINS  Result Date: 06/07/2020 Rosann Auerbach, RT     06/07/2020  8:48 AM RN from 2c16 called and requested 2 gravity bags for chole drain.  Tube station 24 by tech Deirdre Evener)       Scheduled Meds: . amLODipine  5 mg Oral Daily  . aspirin EC  81 mg Oral Daily  . atorvastatin  80 mg Oral Daily  . diclofenac Sodium  2 g Topical QID  . heparin injection (subcutaneous)  5,000 Units Subcutaneous Q8H  . insulin aspart  0-6 Units Subcutaneous TID WC  . metoprolol tartrate  12.5 mg Oral BID  . montelukast  10 mg Oral Daily  . pantoprazole  40 mg Oral Daily  . sodium chloride flush  3 mL Intravenous Q12H  . sodium chloride flush  3 mL Intravenous Q12H  . tamsulosin  0.4 mg Oral QPC breakfast  . zolpidem  5 mg Oral QHS   Continuous Infusions: . sodium chloride    . sodium chloride 50 mL/hr at 06/06/20 2144    . ampicillin-sulbactam (UNASYN) IV 3 g (06/07/20 1132)     LOS: 6 days     Cordelia Poche, MD [facility] 06/07/2020, 1:50 PM  If 7PM-7AM, please contact night-coverage [email or web address]

## 2020-06-07 NOTE — Procedures (Signed)
RN from 2c16 called and requested 2 gravity bags for chole drain.  Tube station 24 by tech Deirdre Evener)

## 2020-06-07 NOTE — Progress Notes (Signed)
Pt is functioning independently in ADL and ADL transfers. No OT needs.   06/07/20 1400  OT Visit Information  Last OT Received On 06/07/20  Assistance Needed +1  History of Present Illness Phillip Kirk is a 69 y.o. male with a history of hypertension. He presented secondary to chest pain, flank pain and chills found to have severe sepsis secondary to cholecystitis and bacteremia.   Precautions  Precautions None  Home Living  Family/patient expects to be discharged to: Private residence  Living Arrangements Spouse/significant other  Available Help at Discharge Family;Available 24 hours/day  Type of Home House  Home Access Stairs to enter  Entrance Stairs-Number of Steps 3  Entrance Stairs-Rails None  Home Layout One level  Bathroom Shower/Tub Tub/shower unit  Automotive engineer Grab bars - tub/shower  Prior Function  Level of Independence Independent  Communication  Communication No difficulties  Pain Assessment  Pain Assessment Faces  Faces Pain Scale 4  Pain Location  L flank  Pain Descriptors / Indicators Sore  Pain Intervention(s) Monitored during session  Cognition  Arousal/Alertness Awake/alert  Behavior During Therapy WFL for tasks assessed/performed  Overall Cognitive Status Within Functional Limits for tasks assessed  Upper Extremity Assessment  Upper Extremity Assessment Overall WFL for tasks assessed  Lower Extremity Assessment  Lower Extremity Assessment Defer to PT evaluation  Cervical / Trunk Assessment  Cervical / Trunk Assessment Normal  ADL  Overall ADL's  Independent  Vision- History  Baseline Vision/History Wears glasses  Wears Glasses At all times  Patient Visual Report No change from baseline  Bed Mobility  Overal bed mobility Independent  Transfers  Overall transfer level Independent  Equipment used None  Balance  Overall balance assessment No apparent balance deficits (not formally assessed)  OT - End of Session   Activity Tolerance Patient tolerated treatment well  Patient left in bed;with call bell/phone within reach  OT Assessment  OT Recommendation/Assessment Patient does not need any further OT services  OT Visit Diagnosis Pain  OT Problem List Pain  AM-PAC OT "6 Clicks" Daily Activity Outcome Measure (Version 2)  Help from another person eating meals? 4  Help from another person taking care of personal grooming? 4  Help from another person toileting, which includes using toliet, bedpan, or urinal? 4  Help from another person bathing (including washing, rinsing, drying)? 4  Help from another person to put on and taking off regular upper body clothing? 4  Help from another person to put on and taking off regular lower body clothing? 4  6 Click Score 24  OT Recommendation  Follow Up Recommendations No OT follow up  OT Equipment None recommended by OT  Acute Rehab OT Goals  Patient Stated Goal to go home  OT Time Calculation  OT Start Time (ACUTE ONLY) 1400  OT Stop Time (ACUTE ONLY) 1412  OT Time Calculation (min) 12 min  OT General Charges  $OT Visit 1 Visit  OT Evaluation  $OT Eval Low Complexity 1 Low  Written Expression  Dominant Hand Right  Nestor Lewandowsky, OTR/L Acute Rehabilitation Services Pager: [phone] Office: [phone]

## 2020-06-07 NOTE — Evaluation (Addendum)
Physical Therapy Evaluation and D/C Patient Details Name: Phillip Kirk MRN: [id number] DOB: 07/05/1951 Today's Date: 06/07/2020   History of Present Illness  Jaeden Kirk is a 69 y.o. male with a history of hypertension. He presented secondary to chest pain, flank pain and chills found to have severe sepsis secondary to cholecystitis and bacteremia.   Clinical Impression  Pt admitted with above diagnosis. Pt was able to ambulate without device without LOB. Does not need skilled PT at this time.  Will sign off.        Follow Up Recommendations No PT follow up    Equipment Recommendations  None recommended by PT    Recommendations for Other Services       Precautions / Restrictions Precautions Precautions: Fall Restrictions Weight Bearing Restrictions: No      Mobility  Bed Mobility Overal bed mobility: Independent                Transfers Overall transfer level: Independent                  Ambulation/Gait Ambulation/Gait assistance: Independent Gait Distance (Feet): 250 Feet Assistive device: None Gait Pattern/deviations: Step-through pattern;Decreased stride length   Gait velocity interpretation: 1.31 - 2.62 ft/sec, indicative of limited community ambulator General Gait Details: Pt without LOB with good overall balance.    Stairs            Wheelchair Mobility    Modified Rankin (Stroke Patients Only)       Balance Overall balance assessment: No apparent balance deficits (not formally assessed)                                           Pertinent Vitals/Pain Pain Assessment: No/denies pain    Home Living Family/patient expects to be discharged to:: Private residence Living Arrangements: Spouse/significant other Available Help at Discharge: Family;Available 24 hours/day Type of Home: House Home Access: Stairs to enter Entrance Stairs-Rails: None Entrance Stairs-Number of Steps: 3 Home Layout: One level Home  Equipment: None;Grab bars - tub/shower      Prior Function Level of Independence: Independent         Comments: wife does driving because pt doesnt like to drive; mow grass with riding ,mower and weed eats.      Hand Dominance   Dominant Hand: Right    Extremity/Trunk Assessment   Upper Extremity Assessment Upper Extremity Assessment: Defer to OT evaluation    Lower Extremity Assessment Lower Extremity Assessment: Overall WFL for tasks assessed    Cervical / Trunk Assessment Cervical / Trunk Assessment: Normal  Communication   Communication: No difficulties  Cognition Arousal/Alertness: Awake/alert Behavior During Therapy: WFL for tasks assessed/performed Overall Cognitive Status: Within Functional Limits for tasks assessed                                        General Comments General comments (skin integrity, edema, etc.): 66bpm, 95% O2 on RA    Exercises     Assessment/Plan    PT Assessment Patent does not need any further PT services  PT Problem List         PT Treatment Interventions      PT Goals (Current goals can be found in the Care Plan section)  Acute Rehab PT Goals Patient  Stated Goal: to go home PT Goal Formulation: All assessment and education complete, DC therapy    Frequency     Barriers to discharge        Co-evaluation               AM-PAC PT "6 Clicks" Mobility  Outcome Measure Help needed turning from your back to your side while in a flat bed without using bedrails?: None Help needed moving from lying on your back to sitting on the side of a flat bed without using bedrails?: None Help needed moving to and from a bed to a chair (including a wheelchair)?: None Help needed standing up from a chair using your arms (e.g., wheelchair or bedside chair)?: None Help needed to walk in hospital room?: None Help needed climbing 3-5 steps with a railing? : None 6 Click Score: 24    End of Session Equipment  Utilized During Treatment: Gait belt Activity Tolerance: Patient tolerated treatment well Patient left: in bed;with call bell/phone within reach;with family/visitor present (on EOB) Nurse Communication: Mobility status PT Visit Diagnosis: Muscle weakness (generalized) (M62.81)    Time: 1100-1110 PT Time Calculation (min) (ACUTE ONLY): 10 min   Charges:   PT Evaluation $PT Eval Low Complexity: 1 Low          Nevah Dalal W,PT Acute Rehabilitation Services Pager:  [phone]  Office:  [place] 06/07/2020, 1:42 PM

## 2020-06-07 NOTE — Plan of Care (Signed)

## 2020-06-07 NOTE — Progress Notes (Signed)
[facility] Progress Note     Subjective: CC-  Abdomen sore at site of drain but overall abdominal pain much better. Bloating resolved. Tolerating clear liquids without n/v. He had 3-4 loose BMs yesterday, and 1 so far this morning. Ambulated in the halls yesterday without issues. WBC 6.7, afebrile.  Objective: Vital signs in last 24 hours: Temp:  [97.8 F (36.6 C)-98.4 F (36.9 C)] 97.8 F (36.6 C) (09/02 0730) Pulse Rate:  [66-80] 79 (09/02 0730) Resp:  [15-23] 19 (09/02 0730) BP: (126-151)/(53-84) 151/75 (09/02 0730) SpO2:  [92 %-95 %] 95 % (09/02 0730) Weight:  [75.7 kg] 75.7 kg (09/02 0610) Last BM Date: 06/06/20  Intake/Output from previous day: 09/01 0701 - 09/02 0700 In: 1670 [P.O.:720; I.V.:650; IV Piggyback:300] Out: 100 [Drains:100] Intake/Output this shift: No intake/output data recorded.  PE: Gen: Alert, NAD, pleasant HEENT: EOM's intact, pupils equal and round Card: RRR Pulm: rate and effort normal Abd: soft, mild distension, mild TTP RUQ around drain, drain with bilious fluid in bag, no peritonitis, +BS Skin: no rashes noted, warm and dry   Lab Results:  Recent Labs    06/06/20 0046 06/07/20 0146  WBC 8.6 6.7  HGB 12.7* 12.6*  HCT 38.6* 38.5*  PLT 203 209   BMET Recent Labs    06/06/20 0046 06/07/20 0146  NA 134* 136  K 3.5 3.2*  CL 99 102  CO2 25 26  GLUCOSE 97 93  BUN 9 8  CREATININE 1.04 0.91  CALCIUM 8.4* 8.5*   PT/INR No results for input(s): LABPROT, INR in the last 72 hours. CMP     Component Value Date/Time   NA 136 06/07/2020 0146   K 3.2 (L) 06/07/2020 0146   CL 102 06/07/2020 0146   CO2 26 06/07/2020 0146   GLUCOSE 93 06/07/2020 0146   BUN 8 06/07/2020 0146   CREATININE 0.91 06/07/2020 0146   CREATININE 1.00 01/27/2020 1122   CREATININE 0.94 12/27/2019 1232   CALCIUM 8.5 (L) 06/07/2020 0146   PROT 6.5 06/03/2020 1033   ALBUMIN 3.1 (L) 06/03/2020 1033   AST 18 06/03/2020 1033   AST 17 01/27/2020  1122   ALT 15 06/03/2020 1033   ALT 14 01/27/2020 1122   ALKPHOS 53 06/03/2020 1033   BILITOT 1.0 06/03/2020 1033   BILITOT 0.3 01/27/2020 1122   GFRNONAA >60 06/07/2020 0146   GFRNONAA >60 01/27/2020 1122   GFRAA >60 06/07/2020 0146   GFRAA >60 01/27/2020 1122   Lipase     Component Value Date/Time   LIPASE 23 06/03/2020 1033       Studies/Results: IR Perc Cholecystostomy  Result Date: 06/05/2020 INDICATION: 69 year old male with acute calculus cholecystitis. He is currently not a surgical candidate and requires percutaneous cholecystostomy tube placement. EXAM: CHOLECYSTOSTOMY MEDICATIONS: In patient currently receiving intravenous antibiotic therapy. No additional prophylaxis administered. ANESTHESIA/SEDATION: Moderate (conscious) sedation was employed during this procedure. A total of Versed 2 mg and Fentanyl 75 mcg was administered intravenously. Moderate Sedation Time: 16 minutes. The patient's level of consciousness and vital signs were monitored continuously by radiology nursing throughout the procedure under my direct supervision. FLUOROSCOPY TIME:  Fluoroscopy Time: 0 minutes 54 seconds (2 mGy). COMPLICATIONS: None immediate. PROCEDURE: Informed written consent was obtained from the patient after a thorough discussion of the procedural risks, benefits and alternatives. All questions were addressed. Maximal Sterile Barrier Technique was utilized including caps, mask, sterile gowns, sterile gloves, sterile drape, hand hygiene and skin antiseptic. A timeout was performed prior to  the initiation of the procedure. The right upper quadrant was interrogated with ultrasound. The colon was successfully identified. A suitable skin entry site lateral to the inter positioned: Was selected. Local anesthesia was attained by infiltration with 1% lidocaine. A small dermatotomy was made. Under real time ultrasound guidance, a 21 gauge Accustick needle was advanced along a short transhepatic course  and used to puncture the gallbladder lumen. There was return of cloudy bile. A 0.018 inch wire was then coiled in the gallbladder lumen. The Accustick needle was exchanged for the transitional sheath which was advanced over the wire and into the gallbladder lumen. A hand injection of contrast material was performed opacifying the gallbladder lumen. Filling defects are present consistent with known cholelithiasis. A 0.035 wire was then coiled in the gallbladder lumen. The transhepatic tract was dilated to 10 [place] and a Cook 10.2 [place] all-purpose drainage catheter was advanced over the wire and formed. Aspiration yields 30 mL turbid bile which was sent for Gram stain and culture. The drainage catheter was gently flushed and connected to gravity bag for drainage. The catheter was secured to the skin with 0 Prolene suture and a sterile bandage was applied. The patient tolerated the procedure well. IMPRESSION: Successful placement of a 10 French percutaneous transhepatic cholecystostomy tube for treatment of acute calculus cholecystitis. Electronically Signed   By: Jacqulynn Cadet M.D.   On: 06/05/2020 15:54   DG Abd Portable 1V  Result Date: 06/06/2020 CLINICAL DATA:  Abdominal distension. EXAM: PORTABLE ABDOMEN - 1 VIEW COMPARISON:  None. FINDINGS: Mildly dilated small bowel loops are noted in the left upper quadrant concerning for ileus or possibly distal small bowel obstruction. No significant colonic dilatation is noted. Pigtail drainage catheter is noted in the right upper quadrant. IMPRESSION: Mildly dilated small bowel loops are noted in left upper quadrant concerning for ileus or possibly distal small bowel obstruction. Follow-up radiographs are recommended. Electronically Signed   By: Marijo Conception M.D.   On: 06/06/2020 09:02    Anti-infectives: Anti-infectives (From admission, onward)   Start     Dose/Rate Route Frequency Ordered Stop   06/05/20 1200  Ampicillin-Sulbactam (UNASYN) 3 g in  sodium chloride 0.9 % 100 mL IVPB        3 g 200 mL/hr over 30 Minutes Intravenous Every 6 hours 06/05/20 0930     06/04/20 0600  metroNIDAZOLE (FLAGYL) IVPB 500 mg  Status:  Discontinued        500 mg 100 mL/hr over 60 Minutes Intravenous Every 8 hours 06/04/20 0253 06/05/20 0930   06/04/20 0300  cefTRIAXone (ROCEPHIN) 2 g in sodium chloride 0.9 % 100 mL IVPB  Status:  Discontinued        2 g 200 mL/hr over 30 Minutes Intravenous Every 24 hours 06/04/20 0253 06/05/20 0930   06/03/20 1700  piperacillin-tazobactam (ZOSYN) IVPB 3.375 g  Status:  Discontinued        3.375 g 12.5 mL/hr over 240 Minutes Intravenous Every 8 hours 06/03/20 1630 06/04/20 0253       Assessment/Plan HTN GERD BPH DM - A1c 6 Chest pain,elevated troponin - initially felt be to2/2 type II NSTEMI, now suspect demand ischemia, cardiology following and does not recommend cardiac cath  Strep bacteremia- repeat Bcx pending Acute calculous cholecystitis  -HIDA + S/p perc chole 8/31 - culture growing strep gallolyticus, report pending  ID -zosyn 8/29>>8/30, rocephin/flagyl 8/30>>8/31, unasyn 8/31>> FEN -IVF, FLD VTE -SCDs, sq heparin Foley -none Follow up - IR, Dr. Ninfa Linden  Plan: Advance to FLD and advance diet as tolerated. If doing well with diet I think he would be ready for discharge tomorrow from surgical standpoint. Abx per primary due to positive blood culture. He will need follow up with IR, and then with Dr. Ninfa Linden in our office to discuss interval cholecystectomy.   LOS: 6 days    [facility] 06/07/2020, 8:07 AM Please see Amion for pager number during day hours 7:00am-4:30pm

## 2020-06-08 ENCOUNTER — Other Ambulatory Visit (HOSPITAL_COMMUNITY): Payer: Self-pay | Admitting: Radiology

## 2020-06-08 ENCOUNTER — Other Ambulatory Visit: Payer: Self-pay | Admitting: Radiology

## 2020-06-08 LAB — GLUCOSE, CAPILLARY
Glucose-Capillary: 94 mg/dL (ref 70–99)
Glucose-Capillary: 91 mg/dL (ref 70–99)

## 2020-06-08 MED ORDER — TRIAMCINOLONE 0.1 % CREAM:EUCERIN CREAM 1:1: TOPICAL_CREAM | Freq: Two times a day (BID) | CUTANEOUS | Status: DC

## 2020-06-08 MED ORDER — SODIUM CHLORIDE 0.9 % IJ SOLN: 10.0000 mL | Freq: Every day | INTRAMUSCULAR | 3 refills | Status: AC

## 2020-06-08 MED ORDER — TRIAMCINOLONE 0.1 % CREAM:EUCERIN CREAM 1:1: 1.0000 "application " | TOPICAL_CREAM | Freq: Two times a day (BID) | CUTANEOUS | 0 refills | Status: AC

## 2020-06-08 MED ORDER — DIPHENHYDRAMINE-ZINC ACETATE 2-0.1 % EX CREA
TOPICAL_CREAM | Freq: Three times a day (TID) | CUTANEOUS | Status: DC | PRN
  Filled 2020-06-08: qty 28

## 2020-06-08 MED ORDER — ATORVASTATIN CALCIUM 80 MG PO TABS
80.0000 mg | ORAL_TABLET | Freq: Every day | ORAL | 0 refills | Status: DC
Start: 2020-06-09 — End: 2020-08-22

## 2020-06-08 MED ORDER — TRIAMCINOLONE 0.1 % CREAM:EUCERIN CREAM 1:1
TOPICAL_CREAM | Freq: Two times a day (BID) | CUTANEOUS | Status: DC
  Filled 2020-06-08: qty 1

## 2020-06-08 MED ORDER — AMOXICILLIN 500 MG PO CAPS: 500.0000 mg | ORAL_CAPSULE | Freq: Three times a day (TID) | ORAL | 0 refills | Status: AC

## 2020-06-08 MED FILL — NORMAL SALINE FLUSH SYRINGE: 0.9 | 28 days supply | Qty: 280 | Fill #0

## 2020-06-08 NOTE — Progress Notes (Signed)
Pt discharged home, instructions given to Pt, and teaching provided on dressing change and how to empty the drainage bag.

## 2020-06-08 NOTE — Progress Notes (Signed)
Referring Physician(s): B. Barnard PA  Supervising Physician: Jacqulynn Cadet  Patient Status:  [facility] - In-pt  Chief Complaint: Acute calculous cholecystitis s/p cholecystomy tube placement on 8.31.21 by Dr. Shellia Cleverly  Subjective:  Patient alert and initially standing near bed, wife is at bedside. Patient is calm and comfortable. Denies any fevers, headache, chest pain, SOB, cough, abdominal pain, nausea, vomiting or bleeding. Patient is pending discharge.   Allergies: Patient has no known allergies.  Medications: Prior to Admission medications   Medication Sig Start Date End Date Taking? Authorizing Provider  acetaminophen (TYLENOL) 500 MG tablet Take 500-1,000 mg by mouth every 6 (six) hours as needed for moderate pain or headache.    Yes [provider]  amLODipine (NORVASC) 5 MG tablet Take 5 mg by mouth daily.    Yes [provider]  aspirin EC 81 MG tablet Take 1 tablet (81 mg total) by mouth daily. 03/04/20  Yes Rehman, Mechele Dawley, MD  metoprolol tartrate (LOPRESSOR) 25 MG tablet Take 12.5 mg by mouth in the morning and at bedtime. 01/20/20  Yes [provider]  montelukast (SINGULAIR) 10 MG tablet Take 1 tablet (10 mg total) by mouth daily. 12/27/19  Yes Rehman, Mechele Dawley, MD  omeprazole (PRILOSEC) 40 MG capsule Take 40 mg by mouth daily. 12/20/19  Yes [provider]  tamsulosin (FLOMAX) 0.4 MG CAPS capsule Take 0.4 mg by mouth daily after breakfast.   Yes [provider]  triamcinolone cream (KENALOG) 0.1 % Apply 1 application topically 3 (three) times daily as needed (psoriasis).  01/21/20  Yes [provider]  zolpidem (AMBIEN) 5 MG tablet Take 5 mg by mouth daily.  01/20/20  Yes [provider]  amoxicillin (AMOXIL) 500 MG capsule Take 1 capsule (500 mg total) by mouth 3 (three) times daily for 9 days. 06/08/20 06/17/20  Mariel Aloe, MD  atorvastatin (LIPITOR) 80 MG tablet Take 1 tablet (80 mg total) by mouth  daily. 06/09/20   Mariel Aloe, MD  Triamcinolone Acetonide (TRIAMCINOLONE 0.1 % CREAM : EUCERIN) CREA Apply 1 application topically 2 (two) times daily. Apply to back. 06/08/20   Mariel Aloe, MD     Vital Signs: BP (!) 149/80 (BP Location: Left Arm)    Pulse 68    Temp 98.1 F (36.7 C) (Oral)    Resp 20    Ht 5\' 9"  (1.753 m)    Wt 168 lb 6.9 oz (76.4 kg)    SpO2 94%    BMI 24.87 kg/m   Physical Exam Vitals and nursing note reviewed.  Constitutional:      Appearance: He is well-developed.  HENT:     Head: Normocephalic.  Pulmonary:     Effort: Pulmonary effort is normal.  Abdominal:     Comments: Positive RUQ drain  to  suction or gravity bag. site is unremarkable with no erythema, edema, tenderness, bleeding or drainage noted at exit site. Suture and stat lock in place. Dressing is clean dry and intact. 5 ml of  brown colored fluid noted in gravity bag. Drain is able to be flushed easily.    Musculoskeletal:        General: Normal range of motion.     Cervical back: Normal range of motion.  Skin:    General: Skin is dry.  Neurological:     Mental Status: He is alert and oriented to person, place, and time.     Imaging: NM Hepato W/EF  Result Date:  06/04/2020 CLINICAL DATA:  69 year old male with abdominal pain and cholelithiasis. EXAM: NUCLEAR MEDICINE HEPATOBILIARY IMAGING TECHNIQUE: Sequential images of the abdomen were obtained out to 60 minutes following intravenous administration of radiopharmaceutical. 3 mg of IV morphine was administered during the examination. RADIOPHARMACEUTICALS:  4.8 mCi Tc-21m Choletec IV COMPARISON:  06/03/2020 CT FINDINGS: Prompt uptake and biliary excretion of activity by the liver is seen. Biliary activity passes into small bowel, consistent with patent common bile duct. Gallbladder is not visualized on initial imaging and after morphine administration, compatible with acute cholecystitis. IMPRESSION: 1. Nonvisualized gallbladder compatible with  acute cholecystitis. 2. Patent CBD. Electronically Signed   By: Margarette Canada M.D.   On: 06/04/2020 19:23   IR Perc Cholecystostomy  Result Date: 06/05/2020 INDICATION: 69 year old male with acute calculus cholecystitis. He is currently not a surgical candidate and requires percutaneous cholecystostomy tube placement. EXAM: CHOLECYSTOSTOMY MEDICATIONS: In patient currently receiving intravenous antibiotic therapy. No additional prophylaxis administered. ANESTHESIA/SEDATION: Moderate (conscious) sedation was employed during this procedure. A total of Versed 2 mg and Fentanyl 75 mcg was administered intravenously. Moderate Sedation Time: 16 minutes. The patient's level of consciousness and vital signs were monitored continuously by radiology nursing throughout the procedure under my direct supervision. FLUOROSCOPY TIME:  Fluoroscopy Time: 0 minutes 54 seconds (2 mGy). COMPLICATIONS: None immediate. PROCEDURE: Informed written consent was obtained from the patient after a thorough discussion of the procedural risks, benefits and alternatives. All questions were addressed. Maximal Sterile Barrier Technique was utilized including caps, mask, sterile gowns, sterile gloves, sterile drape, hand hygiene and skin antiseptic. A timeout was performed prior to the initiation of the procedure. The right upper quadrant was interrogated with ultrasound. The colon was successfully identified. A suitable skin entry site lateral to the inter positioned: Was selected. Local anesthesia was attained by infiltration with 1% lidocaine. A small dermatotomy was made. Under real time ultrasound guidance, a 21 gauge Accustick needle was advanced along a short transhepatic course and used to puncture the gallbladder lumen. There was return of cloudy bile. A 0.018 inch wire was then coiled in the gallbladder lumen. The Accustick needle was exchanged for the transitional sheath which was advanced over the wire and into the gallbladder lumen. A  hand injection of contrast material was performed opacifying the gallbladder lumen. Filling defects are present consistent with known cholelithiasis. A 0.035 wire was then coiled in the gallbladder lumen. The transhepatic tract was dilated to 10 [place] and a Cook 10.2 [place] all-purpose drainage catheter was advanced over the wire and formed. Aspiration yields 30 mL turbid bile which was sent for Gram stain and culture. The drainage catheter was gently flushed and connected to gravity bag for drainage. The catheter was secured to the skin with 0 Prolene suture and a sterile bandage was applied. The patient tolerated the procedure well. IMPRESSION: Successful placement of a 10 French percutaneous transhepatic cholecystostomy tube for treatment of acute calculus cholecystitis. Electronically Signed   By: Jacqulynn Cadet M.D.   On: 06/05/2020 15:54   DG Abd Portable 1V  Result Date: 06/06/2020 CLINICAL DATA:  Abdominal distension. EXAM: PORTABLE ABDOMEN - 1 VIEW COMPARISON:  None. FINDINGS: Mildly dilated small bowel loops are noted in the left upper quadrant concerning for ileus or possibly distal small bowel obstruction. No significant colonic dilatation is noted. Pigtail drainage catheter is noted in the right upper quadrant. IMPRESSION: Mildly dilated small bowel loops are noted in left upper quadrant concerning for ileus or possibly distal small bowel obstruction. Follow-up radiographs  are recommended. Electronically Signed   By: Marijo Conception M.D.   On: 06/06/2020 09:02   IR PATIENT EVAL TECH 0-60 MINS  Result Date: 06/07/2020 Rosann Auerbach, RT     06/07/2020  8:48 AM RN from 2c16 called and requested 2 gravity bags for chole drain.  Tube station 24 by tech [facility])   Labs:  CBC: Recent Labs    06/04/20 0630 06/05/20 0602 06/06/20 0046 06/07/20 0146  WBC 16.5* 11.1* 8.6 6.7  HGB 13.8 13.6 12.7* 12.6*  HCT 42.4 41.7 38.6* 38.5*  PLT 196 210 203 209    COAGS: Recent Labs     06/01/20 1950  INR 1.0  APTT 27    BMP: Recent Labs    06/04/20 0630 06/05/20 0602 06/06/20 0046 06/07/20 0146  NA 134* 134* 134* 136  K 3.8 3.3* 3.5 3.2*  CL 94* 99 99 102  CO2 29 23 25 26   GLUCOSE 102* 97 97 93  BUN 11 16 9 8   CALCIUM 9.0 8.9 8.4* 8.5*  CREATININE 1.41* 1.14 1.04 0.91  GFRNONAA 50* >60 >60 >60  GFRAA 58* >60 >60 >60    LIVER FUNCTION TESTS: Recent Labs    01/27/20 1122 06/03/20 1033  BILITOT 0.3 1.0  AST 17 18  ALT 14 15  ALKPHOS 85 53  PROT 7.0 6.5  ALBUMIN 3.5 3.1*    Assessment and Plan:  69 y.o, male inpatient. History of HTN, presented to this facility with chest pain and flank pain found to septic secondary to acute calculous cholecystitis with elevated troponin thought to be demand ischemia. Cultures from cholecystitis gram strep gallolyticus, pansenstive.  IR placed a cholecystomy tube on 8.31.21. Patient is being followed by surgery. Per Epic output has been:   60 ml, 100 ml, 125 ml  Output appears brown and bilious in nature, flushes easily. Insertion site clean, dry, dressed. KUB from 9.1.21  Reads Mildly dilated small bowel loops are noted in left upper quadrant concerning for ileus or possibly distal small bowel obstruction. Team is following. Patient has no known allergies.  All labs are within acceptable parameters. Patient is on subcutaneous prophylactic dose of heparin and is afebrile. Patient is pending discharge.    Discharge instructions are as follows: - Flush each drain once daily with 5-10 cc NS flush (patient will need an order for flushes upon discharge). Patient and wife educated on flushing drain at bedside. Request besides RN to enforce education.  - Record output from each drain once daily. - Follow-up at drain clinic 6-8 weeks  after discharge for possible drain exchange. Clinic schedulers will contact patient directly with appointment information.    Electronically Signed: Jacqualine Mau, NP 06/08/2020, 11:57  AM   I spent a total of 15 Minutes at the patient's bedside AND on the patient's hospital floor or unit, greater than 50% of which was counseling/coordinating care for cholecystotomy tube.

## 2020-06-08 NOTE — Plan of Care (Signed)

## 2020-06-08 NOTE — Progress Notes (Signed)
[facility] Progress Note     Subjective: CC-  Sitting up in bed eating breakfast. Feeling well. Denies abdominal pain, nausea, vomiting. BM yesterday.  Objective: Vital signs in last 24 hours: Temp:  [97.3 F (36.3 C)-98.6 F (37 C)] 97.8 F (36.6 C) (09/03 0400) Pulse Rate:  [69-75] 75 (09/03 0400) Resp:  [17-20] 20 (09/03 0400) BP: (121-155)/(68-83) 154/81 (09/03 0400) SpO2:  [93 %-95 %] 94 % (09/03 0400) Weight:  [76.4 kg] 76.4 kg (09/03 0400) Last BM Date: 06/07/20  Intake/Output from previous day: 09/02 0701 - 09/03 0700 In: 3338.3 [P.O.:960; I.V.:1778.3; IV Piggyback:600] Out: 910 [Urine:850; Drains:60] Intake/Output this shift: No intake/output data recorded.  PE: Gen: Alert, NAD, pleasant HEENT: EOM's intact, pupils equal and round Card: RRR Pulm: rate and effort normal Abd: soft, mild distension, mild TTP RUQ around drain, drain with bilious fluid in bag, no peritonitis, +BS Skin: no rashes noted, warm and dry   Lab Results:  Recent Labs    06/06/20 0046 06/07/20 0146  WBC 8.6 6.7  HGB 12.7* 12.6*  HCT 38.6* 38.5*  PLT 203 209   BMET Recent Labs    06/06/20 0046 06/07/20 0146  NA 134* 136  K 3.5 3.2*  CL 99 102  CO2 25 26  GLUCOSE 97 93  BUN 9 8  CREATININE 1.04 0.91  CALCIUM 8.4* 8.5*   PT/INR No results for input(s): LABPROT, INR in the last 72 hours. CMP     Component Value Date/Time   NA 136 06/07/2020 0146   K 3.2 (L) 06/07/2020 0146   CL 102 06/07/2020 0146   CO2 26 06/07/2020 0146   GLUCOSE 93 06/07/2020 0146   BUN 8 06/07/2020 0146   CREATININE 0.91 06/07/2020 0146   CREATININE 1.00 01/27/2020 1122   CREATININE 0.94 12/27/2019 1232   CALCIUM 8.5 (L) 06/07/2020 0146   PROT 6.5 06/03/2020 1033   ALBUMIN 3.1 (L) 06/03/2020 1033   AST 18 06/03/2020 1033   AST 17 01/27/2020 1122   ALT 15 06/03/2020 1033   ALT 14 01/27/2020 1122   ALKPHOS 53 06/03/2020 1033   BILITOT 1.0 06/03/2020 1033   BILITOT 0.3  01/27/2020 1122   GFRNONAA >60 06/07/2020 0146   GFRNONAA >60 01/27/2020 1122   GFRAA >60 06/07/2020 0146   GFRAA >60 01/27/2020 1122   Lipase     Component Value Date/Time   LIPASE 23 06/03/2020 1033       Studies/Results: DG Abd Portable 1V  Result Date: 06/06/2020 CLINICAL DATA:  Abdominal distension. EXAM: PORTABLE ABDOMEN - 1 VIEW COMPARISON:  None. FINDINGS: Mildly dilated small bowel loops are noted in the left upper quadrant concerning for ileus or possibly distal small bowel obstruction. No significant colonic dilatation is noted. Pigtail drainage catheter is noted in the right upper quadrant. IMPRESSION: Mildly dilated small bowel loops are noted in left upper quadrant concerning for ileus or possibly distal small bowel obstruction. Follow-up radiographs are recommended. Electronically Signed   By: Marijo Conception M.D.   On: 06/06/2020 09:02   IR PATIENT EVAL TECH 0-60 MINS  Result Date: 06/07/2020 Rosann Auerbach, RT     06/07/2020  8:48 AM RN from 2c16 called and requested 2 gravity bags for chole drain.  Tube station 24 by tech Deirdre Evener)   Anti-infectives: Anti-infectives (From admission, onward)   Start     Dose/Rate Route Frequency Ordered Stop   06/05/20 1200  Ampicillin-Sulbactam (UNASYN) 3 g in sodium chloride 0.9 % 100 mL  IVPB        3 g 200 mL/hr over 30 Minutes Intravenous Every 6 hours 06/05/20 0930     06/04/20 0600  metroNIDAZOLE (FLAGYL) IVPB 500 mg  Status:  Discontinued        500 mg 100 mL/hr over 60 Minutes Intravenous Every 8 hours 06/04/20 0253 06/05/20 0930   06/04/20 0300  cefTRIAXone (ROCEPHIN) 2 g in sodium chloride 0.9 % 100 mL IVPB  Status:  Discontinued        2 g 200 mL/hr over 30 Minutes Intravenous Every 24 hours 06/04/20 0253 06/05/20 0930   06/03/20 1700  piperacillin-tazobactam (ZOSYN) IVPB 3.375 g  Status:  Discontinued        3.375 g 12.5 mL/hr over 240 Minutes Intravenous Every 8 hours 06/03/20 1630 06/04/20 0253        Assessment/Plan HTN GERD BPH DM - A1c 6 Chest pain,elevated troponin - initially felt be to2/2 type II NSTEMI, now suspect demand ischemia, cardiology following and does not recommend cardiac cath  Strep bacteremia- repeat Bcx pending Acute calculous cholecystitis  -HIDA + S/p perc chole 8/31 - culture growing strep gallolyticus, pansensitive  ID -zosyn 8/29>>8/30, rocephin/flagyl 8/30>>8/31, unasyn 8/31>> FEN -IVF, soft diet VTE -SCDs, sq heparin Foley -none Follow up -IR, Dr. Ninfa Linden  Plan:Patient stable for discharge from surgical standpoint. Continue drain and follow up with IR. Follow up with Dr. Ninfa Linden to discuss interval cholecystectomy, appt on AVS. Abx per primary due to positive blood culture. General surgery will sign off, please call with concerns.   LOS: 7 days    Wellington Hampshire, [facility] 06/08/2020, 7:51 AM Please see Amion for pager number during day hours 7:00am-4:30pm

## 2020-06-08 NOTE — Discharge Summary (Signed)
Physician Discharge Summary  [name] ZYS:[id number] DOB: [birth year] DOA: 06/01/2020  PCP: Lavella Lemons, PA  Admit date: 06/01/2020 Discharge date: 06/08/2020  Admitted From: Home Disposition: Home  Recommendations for Outpatient Follow-up:  1. Follow up with PCP in 1 week 2. Follow up with IR as an outpatient 3. Follow up with General surgery 4. Please obtain BMP/CBC in one week 5. Please follow up on the following pending results: None  Home Health: None Equipment/Devices: None  Discharge Condition: Stable CODE STATUS: Full code Diet recommendation: Soft diet   Brief/Interim Summary:  Admission HPI written by Lynetta Mare, MD   HPI:   HPI: Phillip Kirk is a 69 y.o. male with medical history significant of hypertension, GERD, BPH who presented to the ER with chest pain.  Patient reports sudden onset sharp, aching substernal chest pain, moderate intensity, nonradiating, associated with diaphoresis starting around 4 PM.  Patient also reported having nausea and multiple episodes of vomiting.  No prior history of coronary artery disease however patient was reports that he may have had a MI 4-1/2 years ago however he was only seen in the urgent care and discharge at the time.  Never had a cardiology evaluation. ED Course:  Vital Signs reviewed on presentation, significant for temperature 98.1, heart rate 67, blood pressure 155/73, saturation 98% on room air. Labs reviewed, significant for sodium 138, potassium 3.8, BUN 16, creatinine 0.95, troponin II 56, WBC count 15.6, hemoglobin 15.8, hematocrit 48, platelets 158, INR 1.0, PTT 27. Imaging personally Reviewed, chest x-ray shows no acute cardiopulmonary disease EKG personally reviewed, shows sinus rhythm, PVCs, no significant acute ischemic changes.   Hospital course:  Chest pain EKG without acute ST segment changes and peak troponin of 263. Transthoracic Echocardiogram significant for normal EF and no wall  motion abnormalities. Cardiology consulted with recommendation for no catheterization. Cardiology's assessment is patient had demand ischemia.  Severe sepsis Secondary to strep bacteremia and acute cholecystitis with associated cardiac strain/demand ischemia. Patient started on Unasyn for treatment for which he received 3 days. Discharge on Amoxicillin 500 mg TID on discharge to complete a 14 day course. Sepsis physiology resolved. Unasyn Continue Unasyn, transition to Augmentin in AM  Acute cholecystitis Drain placed by IR. General surgery on board with no recommendations for surgery at this time. Fluid culture significant for strep gallolyticus. General surgery recommendations: Full liquid diet  Enterococcus faecalis bacteruria No urinary symptoms. Unknown significance but will be treated with above regimen.  Strep bacteremia Strep gallolyticus grown on culture. Management as mentioned above (14 day antibiotic treatment). Transthoracic Echocardiogram without vegetation.  Essential hypertension On amlodipine as an outpatient. Continue amlodipine on discharge.  GERD Continue Prilosec  BPH Continue tamsulosin  Hyperglycemia Possibly related to infection. Hemoglobin A1C of 6.0% which qualifies as pre-diabetes. Consider metformin as an outpatient and diet modification  Discharge Diagnoses:  Principal Problem:   NSTEMI (non-ST elevated myocardial infarction) ([place]) Active Problems:   Essential hypertension, benign   Acute renal failure (HCC)   GERD (gastroesophageal reflux disease)   BPH (benign prostatic hyperplasia)   Severe sepsis (HCC)   Acute cholecystitis   Elevated troponin    Discharge Instructions   Allergies as of 06/08/2020   No Known Allergies     Medication List    TAKE these medications   acetaminophen 500 MG tablet Commonly known as: TYLENOL Take 500-1,000 mg by mouth every 6 (six) hours as needed for moderate pain or headache.   amLODipine 5 MG  tablet Commonly known as: NORVASC Take 5 mg by mouth daily.   amoxicillin 500 MG capsule Commonly known as: AMOXIL Take 1 capsule (500 mg total) by mouth 3 (three) times daily for 9 days.   aspirin EC 81 MG tablet Take 1 tablet (81 mg total) by mouth daily.   atorvastatin 80 MG tablet Commonly known as: LIPITOR Take 1 tablet (80 mg total) by mouth daily. Start taking on: June 09, 2020   metoprolol tartrate 25 MG tablet Commonly known as: LOPRESSOR Take 12.5 mg by mouth in the morning and at bedtime.   montelukast 10 MG tablet Commonly known as: Singulair Take 1 tablet (10 mg total) by mouth daily.   omeprazole 40 MG capsule Commonly known as: PRILOSEC Take 40 mg by mouth daily.   tamsulosin 0.4 MG Caps capsule Commonly known as: FLOMAX Take 0.4 mg by mouth daily after breakfast.   triamcinolone 0.1 % cream : eucerin Crea Apply 1 application topically 2 (two) times daily. Apply to back.   triamcinolone cream 0.1 % Commonly known as: KENALOG Apply 1 application topically 3 (three) times daily as needed (psoriasis).   zolpidem 5 MG tablet Commonly known as: AMBIEN Take 5 mg by mouth daily.       Follow-up Information    Coralie Keens, MD. Go on 07/06/2020.   Specialty: General Surgery Why: Your appointment is 10/1 at 10:20am Pleaes arrive 30 minutes prior to your appointment to check in and fill out paperwork. Bring photo ID and insurance information. Contact information: [address] [place] [address] [phone]        Jacqulynn Cadet, MD Follow up in 6 week(s).   Specialties: Interventional Radiology, Radiology Why: 6-8 weeks for drain exchange - IR scheduler will call you with appointment date/time. Please call with questions or concerns prior to your appointment. Contact information: [address] [place] [address] [phone]              No Known Allergies  Consultations:  General  surgery  Interventional radiology   Procedures/Studies: DG Chest 2 View  Result Date: 06/01/2020 CLINICAL DATA:  Chest pain EXAM: CHEST - 2 VIEW COMPARISON:  08/16/2019 FINDINGS: The heart size and mediastinal contours are within normal limits. Both lungs are clear. The visualized skeletal structures are unremarkable. IMPRESSION: No active cardiopulmonary disease. Electronically Signed   By: Rolm Baptise M.D.   On: 06/01/2020 21:00   CT ABDOMEN PELVIS W CONTRAST  Result Date: 06/03/2020 CLINICAL DATA:  Abdominal pain and fever. EXAM: CT ABDOMEN AND PELVIS WITH CONTRAST TECHNIQUE: Multidetector CT imaging of the abdomen and pelvis was performed using the standard protocol following bolus administration of intravenous contrast. CONTRAST:  144mL OMNIPAQUE IOHEXOL 300 MG/ML  SOLN COMPARISON:  November 28, 2019 FINDINGS: Lower chest: Mild atelectasis is seen within the posterior aspect of the bilateral lung bases. Hepatobiliary: There is diffuse fatty infiltration of the liver parenchyma. No focal liver abnormality is seen. A 1.0 cm gallstone is seen within the gallbladder lumen without evidence of gallbladder wall thickening or biliary dilatation. Mild pericholecystic inflammatory fat stranding is seen near the neck of the gallbladder. Pancreas: Unremarkable. No pancreatic ductal dilatation or surrounding inflammatory changes. Spleen: Normal in size without focal abnormality. Adrenals/Urinary Tract: Adrenal glands are unremarkable. Kidneys are normal in size, without renal calculi or hydronephrosis. A 1.3 cm simple cyst is seen within the anterior aspect of the mid left kidney. Bladder is unremarkable. Stomach/Bowel: Stomach is within normal  limits. Appendix appears normal. A 1.3 cm fat density lesion is seen within the lumen of the proximal duodenum. No evidence of bowel wall thickening, distention, or inflammatory changes. Noninflamed diverticula are seen within the proximal sigmoid colon.  Vascular/Lymphatic: No significant vascular findings are present. No enlarged abdominal or pelvic lymph nodes. Reproductive: The prostate gland is markedly enlarged. Other: No abdominal wall hernia or abnormality. No abdominopelvic ascites. Musculoskeletal: No acute or significant osseous findings. IMPRESSION: 1. Cholelithiasis with additional findings consistent with acute cholecystitis. 2. Hepatic steatosis. 3. Sigmoid diverticulosis. 4. Markedly enlarged prostate gland. Electronically Signed   By: Virgina Norfolk M.D.   On: 06/03/2020 16:03   NM Hepato W/EF  Result Date: 06/04/2020 CLINICAL DATA:  69 year old male with abdominal pain and cholelithiasis. EXAM: NUCLEAR MEDICINE HEPATOBILIARY IMAGING TECHNIQUE: Sequential images of the abdomen were obtained out to 60 minutes following intravenous administration of radiopharmaceutical. 3 mg of IV morphine was administered during the examination. RADIOPHARMACEUTICALS:  4.8 mCi Tc-83m Choletec IV COMPARISON:  06/03/2020 CT FINDINGS: Prompt uptake and biliary excretion of activity by the liver is seen. Biliary activity passes into small bowel, consistent with patent common bile duct. Gallbladder is not visualized on initial imaging and after morphine administration, compatible with acute cholecystitis. IMPRESSION: 1. Nonvisualized gallbladder compatible with acute cholecystitis. 2. Patent CBD. Electronically Signed   By: Margarette Canada M.D.   On: 06/04/2020 19:23   IR Perc Cholecystostomy  Result Date: 06/05/2020 INDICATION: 69 year old male with acute calculus cholecystitis. He is currently not a surgical candidate and requires percutaneous cholecystostomy tube placement. EXAM: CHOLECYSTOSTOMY MEDICATIONS: In patient currently receiving intravenous antibiotic therapy. No additional prophylaxis administered. ANESTHESIA/SEDATION: Moderate (conscious) sedation was employed during this procedure. A total of Versed 2 mg and Fentanyl 75 mcg was administered  intravenously. Moderate Sedation Time: 16 minutes. The patient's level of consciousness and vital signs were monitored continuously by radiology nursing throughout the procedure under my direct supervision. FLUOROSCOPY TIME:  Fluoroscopy Time: 0 minutes 54 seconds (2 mGy). COMPLICATIONS: None immediate. PROCEDURE: Informed written consent was obtained from the patient after a thorough discussion of the procedural risks, benefits and alternatives. All questions were addressed. Maximal Sterile Barrier Technique was utilized including caps, mask, sterile gowns, sterile gloves, sterile drape, hand hygiene and skin antiseptic. A timeout was performed prior to the initiation of the procedure. The right upper quadrant was interrogated with ultrasound. The colon was successfully identified. A suitable skin entry site lateral to the inter positioned: Was selected. Local anesthesia was attained by infiltration with 1% lidocaine. A small dermatotomy was made. Under real time ultrasound guidance, a 21 gauge Accustick needle was advanced along a short transhepatic course and used to puncture the gallbladder lumen. There was return of cloudy bile. A 0.018 inch wire was then coiled in the gallbladder lumen. The Accustick needle was exchanged for the transitional sheath which was advanced over the wire and into the gallbladder lumen. A hand injection of contrast material was performed opacifying the gallbladder lumen. Filling defects are present consistent with known cholelithiasis. A 0.035 wire was then coiled in the gallbladder lumen. The transhepatic tract was dilated to 10 [place] and a Cook 10.2 [place] all-purpose drainage catheter was advanced over the wire and formed. Aspiration yields 30 mL turbid bile which was sent for Gram stain and culture. The drainage catheter was gently flushed and connected to gravity bag for drainage. The catheter was secured to the skin with 0 Prolene suture and a sterile bandage was applied. The  patient  tolerated the procedure well. IMPRESSION: Successful placement of a 10 French percutaneous transhepatic cholecystostomy tube for treatment of acute calculus cholecystitis. Electronically Signed   By: Jacqulynn Cadet M.D.   On: 06/05/2020 15:54   DG Abd Portable 1V  Result Date: 06/06/2020 CLINICAL DATA:  Abdominal distension. EXAM: PORTABLE ABDOMEN - 1 VIEW COMPARISON:  None. FINDINGS: Mildly dilated small bowel loops are noted in the left upper quadrant concerning for ileus or possibly distal small bowel obstruction. No significant colonic dilatation is noted. Pigtail drainage catheter is noted in the right upper quadrant. IMPRESSION: Mildly dilated small bowel loops are noted in left upper quadrant concerning for ileus or possibly distal small bowel obstruction. Follow-up radiographs are recommended. Electronically Signed   By: Marijo Conception M.D.   On: 06/06/2020 09:02   IR PATIENT EVAL TECH 0-60 MINS  Result Date: 06/07/2020 Rosann Auerbach, RT     06/07/2020  8:48 AM RN from 2c16 called and requested 2 gravity bags for chole drain.  Tube station 24 by tech Deirdre Evener)  ECHOCARDIOGRAM COMPLETE  Result Date: 06/02/2020    ECHOCARDIOGRAM REPORT   Patient Name:   [facility] Casto Date of Exam: 06/02/2020 Medical Rec #:  [id number]     Height:       69.0 in Accession #:    [id number]    Weight:       171.3 lb Date of Birth:  1951/08/14      BSA:          1.934 m Patient Age:    39 years      BP:           143/76 mmHg Patient Gender: M             HR:           77 bpm. Exam Location:  Inpatient Procedure: 2D Echo, Cardiac Doppler, Color Doppler and Intracardiac            Opacification Agent Indications:    NSTEMI  History:        Patient has no prior history of Echocardiogram examinations.                 Acute MI, Signs/Symptoms:Chest Pain and Shortness of Breath;                 Risk Factors:Hypertension and Former Smoker. ETOH abuse.  Sonographer:    Dustin Flock Referring Phys: [id number] [place]  1. Left ventricular ejection fraction, by estimation, is 55 to 60%. The left ventricle has normal function. The left ventricle has no regional wall motion abnormalities. There is mild left ventricular hypertrophy. Left ventricular diastolic parameters were normal.  2. Right ventricular systolic function is normal. The right ventricular size is normal. There is normal pulmonary artery systolic pressure. The estimated right ventricular systolic pressure is 22.9 mmHg.  3. The mitral valve is grossly normal. No evidence of mitral valve regurgitation.  4. The aortic valve is tricuspid. Aortic valve regurgitation is mild.  5. The inferior vena cava is normal in size with greater than 50% respiratory variability, suggesting right atrial pressure of 3 mmHg. FINDINGS  Left Ventricle: Left ventricular ejection fraction, by estimation, is 55 to 60%. The left ventricle has normal function. The left ventricle has no regional wall motion abnormalities. Definity contrast agent was given IV to delineate the left ventricular  endocardial borders. The left ventricular internal cavity size was normal in size. There is mild left  ventricular hypertrophy. Left ventricular diastolic parameters were normal. Right Ventricle: The right ventricular size is normal. No increase in right ventricular wall thickness. Right ventricular systolic function is normal. There is normal pulmonary artery systolic pressure. The tricuspid regurgitant velocity is 2.87 m/s, and  with an assumed right atrial pressure of 3 mmHg, the estimated right ventricular systolic pressure is 56.8 mmHg. Left Atrium: Left atrial size was normal in size. Right Atrium: Right atrial size was normal in size. Pericardium: There is no evidence of pericardial effusion. Mitral Valve: The mitral valve is grossly normal. No evidence of mitral valve regurgitation. Tricuspid Valve: The tricuspid valve is grossly normal. Tricuspid valve regurgitation is trivial.  Aortic Valve: The aortic valve is tricuspid. Aortic valve regurgitation is mild. Mild aortic valve annular calcification. Pulmonic Valve: The pulmonic valve was grossly normal. Pulmonic valve regurgitation is trivial. Aorta: The aortic root is normal in size and structure. Venous: The inferior vena cava is normal in size with greater than 50% respiratory variability, suggesting right atrial pressure of 3 mmHg. IAS/Shunts: No atrial level shunt detected by color flow Doppler.  LEFT VENTRICLE PLAX 2D LVIDd:         3.80 cm  Diastology LVIDs:         2.60 cm  LV e' lateral:   11.00 cm/s LV PW:         1.10 cm  LV E/e' lateral: 8.3 LV IVS:        1.20 cm  LV e' medial:    12.70 cm/s LVOT diam:     2.20 cm  LV E/e' medial:  7.2 LV SV:         90 LV SV Index:   46 LVOT Area:     3.80 cm  RIGHT VENTRICLE RV Basal diam:  3.50 cm RV S prime:     13.90 cm/s TAPSE (M-mode): 3.0 cm LEFT ATRIUM           Index       RIGHT ATRIUM           Index LA diam:      2.80 cm 1.45 cm/m  RA Area:     14.00 cm LA Vol (A2C): 66.0 ml 34.12 ml/m RA Volume:   34.10 ml  17.63 ml/m LA Vol (A4C): 26.9 ml 13.91 ml/m  AORTIC VALVE LVOT Vmax:   107.00 cm/s LVOT Vmean:  68.800 cm/s LVOT VTI:    0.236 m  AORTA Ao Root diam: 3.20 cm MITRAL VALVE               TRICUSPID VALVE MV Area (PHT): 3.03 cm    TR Peak grad:   32.9 mmHg MV Decel Time: 250 msec    TR Vmax:        287.00 cm/s MV E velocity: 91.20 cm/s MV A velocity: 73.20 cm/s  SHUNTS MV E/A ratio:  1.25        Systemic VTI:  0.24 m                            Systemic Diam: 2.20 cm Rozann Lesches MD Electronically signed by Rozann Lesches MD Signature Date/Time: 06/02/2020/2:46:38 PM    Final       Subjective: No issues overnight. No abdominal pain. Having bowel movements.  Discharge Exam: Vitals:   06/08/20 0801 06/08/20 0907  BP: (!) 144/79 119/64  Pulse:  68  Resp: 20   Temp: 97.9 F (36.6  C)   SpO2:     Vitals:   06/07/20 2317 06/08/20 0400 06/08/20 0801 06/08/20 0907   BP: 121/68 (!) 154/81 (!) 144/79 119/64  Pulse: 72 75  68  Resp: 18 20 20    Temp: 98.2 F (36.8 C) 97.8 F (36.6 C) 97.9 F (36.6 C)   TempSrc: Oral Oral Oral   SpO2: 93% 94%    Weight:  76.4 kg    Height:        General: Pt is alert, awake, not in acute distress Cardiovascular: RRR, S1/S2 +, no rubs, no gallops Respiratory: CTA bilaterally, no wheezing, no rhonchi Abdominal: Soft, NT, ND, bowel sounds + Extremities: no edema, no cyanosis    The results of significant diagnostics from this hospitalization (including imaging, microbiology, ancillary and laboratory) are listed below for reference.     Microbiology: Recent Results (from the past 240 hour(s))  SARS Coronavirus 2 by RT PCR (hospital order, performed in [facility] hospital lab) Nasopharyngeal Nasopharyngeal Swab     Status: None   Collection Time: 06/01/20  9:55 PM   Specimen: Nasopharyngeal Swab  Result Value Ref Range Status   SARS Coronavirus 2 NEGATIVE NEGATIVE Final    Comment: (NOTE) SARS-CoV-2 target nucleic acids are NOT DETECTED.  The SARS-CoV-2 RNA is generally detectable in upper and lower respiratory specimens during the acute phase of infection. The lowest concentration of SARS-CoV-2 viral copies this assay can detect is 250 copies / mL. A negative result does not preclude SARS-CoV-2 infection and should not be used as the sole basis for treatment or other patient management decisions.  A negative result may occur with improper specimen collection / handling, submission of specimen other than nasopharyngeal swab, presence of viral mutation(s) within the areas targeted by this assay, and inadequate number of viral copies (<250 copies / mL). A negative result must be combined with clinical observations, patient history, and epidemiological information.  Fact Sheet for Patients:   StrictlyIdeas.no  Fact Sheet for Healthcare  Providers: BankingDealers.co.za  This test is not yet approved or  cleared by the [place] FDA and has been authorized for detection and/or diagnosis of SARS-CoV-2 by FDA under an Emergency Use Authorization (EUA).  This EUA will remain in effect (meaning this test can be used) for the duration of the COVID-19 declaration under Section 564(b)(1) of the Act, 21 U.S.C. section 360bbb-3(b)(1), unless the authorization is terminated or revoked sooner.  Performed at [facility], [address] [place], [place] [address]   MRSA PCR Screening     Status: None   Collection Time: 06/02/20  5:50 AM   Specimen: Nasal Mucosa; Nasopharyngeal  Result Value Ref Range Status   MRSA by PCR NEGATIVE NEGATIVE Final    Comment:        The GeneXpert MRSA Assay (FDA approved for NASAL specimens only), is one component of a comprehensive MRSA colonization surveillance program. It is not intended to diagnose MRSA infection nor to guide or monitor treatment for MRSA infections. Performed at [facility], [place] [address], [place], [place] [address]   Culture, Urine     Status: Abnormal   Collection Time: 06/03/20 10:13 AM   Specimen: Urine, Random  Result Value Ref Range Status   Specimen Description URINE, RANDOM  Final   Special Requests   Final    NONE Performed at [facility], [place] [address] [place], [place] [address]    Culture >=100,000 COLONIES/mL ENTEROCOCCUS FAECALIS (A)  Final  Report Status 06/05/2020 FINAL  Final   Organism ID, Bacteria ENTEROCOCCUS FAECALIS (A)  Final      Susceptibility   Enterococcus faecalis - MIC*    AMPICILLIN <=2 SENSITIVE Sensitive     NITROFURANTOIN <=16 SENSITIVE Sensitive     VANCOMYCIN 1 SENSITIVE Sensitive     * >=100,000 COLONIES/mL ENTEROCOCCUS FAECALIS  Culture, blood (Routine X 2) w Reflex to ID Panel     Status: Abnormal   Collection Time: 06/03/20 10:38 AM   Specimen: BLOOD  Result Value Ref Range Status    Specimen Description BLOOD LEFT ANTECUBITAL  Final   Special Requests   Final    BOTTLES DRAWN AEROBIC AND ANAEROBIC Blood Culture adequate volume   Culture  Setup Time   Final    GRAM POSITIVE COCCI IN PAIRS IN CHAINS IN BOTH AEROBIC AND ANAEROBIC BOTTLES CRITICAL RESULT CALLED TO, READ BACK BY AND VERIFIED WITH: J. LEDFORD,PHARMD [id number] 06/04/2020 Mena Goes Performed at [facility], [place] [address], [place], [place] [address]    Culture STREPTOCOCCUS GALLOLYTICUS (A)  Final   Report Status 06/05/2020 FINAL  Final   Organism ID, Bacteria STREPTOCOCCUS GALLOLYTICUS  Final      Susceptibility   Streptococcus gallolyticus - MIC*    PENICILLIN 0.12 SENSITIVE Sensitive     CEFTRIAXONE 0.25 SENSITIVE Sensitive     ERYTHROMYCIN <=0.12 SENSITIVE Sensitive     LEVOFLOXACIN 2 SENSITIVE Sensitive     VANCOMYCIN <=0.12 SENSITIVE Sensitive     * STREPTOCOCCUS GALLOLYTICUS  Blood Culture ID Panel (Reflexed)     Status: Abnormal   Collection Time: 06/03/20 10:38 AM  Result Value Ref Range Status   Enterococcus faecalis NOT DETECTED NOT DETECTED Final   Enterococcus Faecium NOT DETECTED NOT DETECTED Final   Listeria monocytogenes NOT DETECTED NOT DETECTED Final   Staphylococcus species NOT DETECTED NOT DETECTED Final   Staphylococcus aureus (BCID) NOT DETECTED NOT DETECTED Final   Staphylococcus epidermidis NOT DETECTED NOT DETECTED Final   Staphylococcus lugdunensis NOT DETECTED NOT DETECTED Final   Streptococcus species DETECTED (A) NOT DETECTED Final    Comment: Not Enterococcus species, Streptococcus agalactiae, Streptococcus pyogenes, or Streptococcus pneumoniae. CRITICAL RESULT CALLED TO, READ BACK BY AND VERIFIED WITH: J. LEDFORD,PHARMD [id number] 06/04/2020 T. TYSOR    Streptococcus agalactiae NOT DETECTED NOT DETECTED Final   Streptococcus pneumoniae NOT DETECTED NOT DETECTED Final   Streptococcus pyogenes NOT DETECTED NOT DETECTED Final   A.calcoaceticus-baumannii NOT DETECTED NOT  DETECTED Final   Bacteroides fragilis NOT DETECTED NOT DETECTED Final   Enterobacterales NOT DETECTED NOT DETECTED Final   Enterobacter cloacae complex NOT DETECTED NOT DETECTED Final   Escherichia coli NOT DETECTED NOT DETECTED Final   Klebsiella aerogenes NOT DETECTED NOT DETECTED Final   Klebsiella oxytoca NOT DETECTED NOT DETECTED Final   Klebsiella pneumoniae NOT DETECTED NOT DETECTED Final   Proteus species NOT DETECTED NOT DETECTED Final   Salmonella species NOT DETECTED NOT DETECTED Final   Serratia marcescens NOT DETECTED NOT DETECTED Final   Haemophilus influenzae NOT DETECTED NOT DETECTED Final   Neisseria meningitidis NOT DETECTED NOT DETECTED Final   Pseudomonas aeruginosa NOT DETECTED NOT DETECTED Final   Stenotrophomonas maltophilia NOT DETECTED NOT DETECTED Final   Candida albicans NOT DETECTED NOT DETECTED Final   Candida auris NOT DETECTED NOT DETECTED Final   Candida glabrata NOT DETECTED NOT DETECTED Final   Candida krusei NOT DETECTED NOT DETECTED Final   Candida parapsilosis NOT DETECTED NOT DETECTED Final   Candida  tropicalis NOT DETECTED NOT DETECTED Final   Cryptococcus neoformans/gattii NOT DETECTED NOT DETECTED Final    Comment: Performed at [facility], [place] [address], [place], [place] [address]  Culture, blood (Routine X 2) w Reflex to ID Panel     Status: Abnormal   Collection Time: 06/03/20 10:45 AM   Specimen: BLOOD LEFT HAND  Result Value Ref Range Status   Specimen Description BLOOD LEFT HAND  Final   Special Requests   Final    BOTTLES DRAWN AEROBIC AND ANAEROBIC Blood Culture results may not be optimal due to an inadequate volume of blood received in culture bottles   Culture  Setup Time   Final    GRAM POSITIVE COCCI IN PAIRS IN CHAINS IN BOTH AEROBIC AND ANAEROBIC BOTTLES CRITICAL VALUE NOTED.  VALUE IS CONSISTENT WITH PREVIOUSLY REPORTED AND CALLED VALUE.    Culture (A)  Final    STREPTOCOCCUS GALLOLYTICUS SUSCEPTIBILITIES PERFORMED  ON PREVIOUS CULTURE WITHIN THE LAST 5 DAYS. Performed at [facility], [place] [address], [place], [place] [address]    Report Status 06/05/2020 FINAL  Final  Culture, blood (Routine X 2) w Reflex to ID Panel     Status: None (Preliminary result)   Collection Time: 06/05/20  6:03 AM   Specimen: BLOOD RIGHT ARM  Result Value Ref Range Status   Specimen Description BLOOD RIGHT ARM  Final   Special Requests   Final    BOTTLES DRAWN AEROBIC AND ANAEROBIC Blood Culture adequate volume   Culture   Final    NO GROWTH 3 DAYS Performed at [facility], [place] [address], [place], [place] [address]    Report Status PENDING  Incomplete  Aerobic/Anaerobic Culture (surgical/deep wound)     Status: None (Preliminary result)   Collection Time: 06/05/20  1:16 PM   Specimen: BILE  Result Value Ref Range Status   Specimen Description BILE  Final   Special Requests NONE  Final   Gram Stain   Final    FEW WBC PRESENT,BOTH PMN AND MONONUCLEAR MODERATE GRAM POSITIVE COCCI Performed at [facility], [address], [place],  [address]    Culture   Final    FEW STREPTOCOCCUS GALLOLYTICUS NO ANAEROBES ISOLATED; CULTURE IN PROGRESS FOR 5 DAYS    Report Status PENDING  Incomplete   Organism ID, Bacteria STREPTOCOCCUS GALLOLYTICUS  Final      Susceptibility   Streptococcus gallolyticus - MIC*    PENICILLIN 0.12 SENSITIVE Sensitive     CEFTRIAXONE <=0.12 SENSITIVE Sensitive     ERYTHROMYCIN <=0.12 SENSITIVE Sensitive     LEVOFLOXACIN 2 SENSITIVE Sensitive     VANCOMYCIN <=0.12 SENSITIVE Sensitive     * FEW STREPTOCOCCUS GALLOLYTICUS     Labs: BNP (last 3 results) No results for input(s): BNP in the last 8760 hours. Basic Metabolic Panel: Recent Labs  Lab 06/01/20 1950 06/04/20 0630 06/05/20 0602 06/06/20 0046 06/07/20 0146  NA 138 134* 134* 134* 136  K 3.8 3.8 3.3* 3.5 3.2*  CL 100 94* 99 99 102  CO2 26 29 23 25 26   GLUCOSE 170* 102* 97 97 93  BUN 16 11 16 9 8    CREATININE 0.95 1.41* 1.14 1.04 0.91  CALCIUM 9.4 9.0 8.9 8.4* 8.5*  MG  --   --   --   --  1.9   Liver Function Tests: Recent Labs  Lab 06/03/20 1033  AST 18  ALT 15  ALKPHOS 53  BILITOT  1.0  PROT 6.5  ALBUMIN 3.1*   Recent Labs  Lab 06/03/20 1033  LIPASE 23   No results for input(s): AMMONIA in the last 168 hours. CBC: Recent Labs  Lab 06/03/20 0115 06/04/20 0630 06/05/20 0602 06/06/20 0046 06/07/20 0146  WBC 18.3* 16.5* 11.1* 8.6 6.7  HGB 13.8 13.8 13.6 12.7* 12.6*  HCT 42.1 42.4 41.7 38.6* 38.5*  MCV 87.7 88.1 87.6 88.3 86.7  PLT 225 196 210 203 209   Cardiac Enzymes: No results for input(s): CKTOTAL, CKMB, CKMBINDEX, TROPONINI in the last 168 hours. BNP: Invalid input(s): POCBNP CBG: Recent Labs  Lab 06/07/20 0632 06/07/20 1122 06/07/20 1612 06/07/20 2057 06/08/20 0628  GLUCAP 85 100* 102* 108* 94   D-Dimer No results for input(s): DDIMER in the last 72 hours. Hgb A1c No results for input(s): HGBA1C in the last 72 hours. Lipid Profile No results for input(s): CHOL, HDL, LDLCALC, TRIG, CHOLHDL, LDLDIRECT in the last 72 hours. Thyroid function studies No results for input(s): TSH, T4TOTAL, T3FREE, THYROIDAB in the last 72 hours.  Invalid input(s): FREET3 Anemia work up No results for input(s): VITAMINB12, FOLATE, FERRITIN, TIBC, IRON, RETICCTPCT in the last 72 hours. Urinalysis    Component Value Date/Time   COLORURINE YELLOW 06/03/2020 1110   APPEARANCEUR HAZY (A) 06/03/2020 1110   LABSPEC 1.020 06/03/2020 1110   PHURINE 5.0 06/03/2020 1110   GLUCOSEU NEGATIVE 06/03/2020 1110   HGBUR SMALL (A) 06/03/2020 1110   BILIRUBINUR NEGATIVE 06/03/2020 1110   KETONESUR NEGATIVE 06/03/2020 1110   PROTEINUR 30 (A) 06/03/2020 1110   NITRITE NEGATIVE 06/03/2020 1110   LEUKOCYTESUR MODERATE (A) 06/03/2020 1110   Sepsis Labs Invalid input(s): PROCALCITONIN,  WBC,  LACTICIDVEN Microbiology Recent Results (from the past 240 hour(s))  SARS  Coronavirus 2 by RT PCR (hospital order, performed in [place] hospital lab) Nasopharyngeal Nasopharyngeal Swab     Status: None   Collection Time: 06/01/20  9:55 PM   Specimen: Nasopharyngeal Swab  Result Value Ref Range Status   SARS Coronavirus 2 NEGATIVE NEGATIVE Final    Comment: (NOTE) SARS-CoV-2 target nucleic acids are NOT DETECTED.  The SARS-CoV-2 RNA is generally detectable in upper and lower respiratory specimens during the acute phase of infection. The lowest concentration of SARS-CoV-2 viral copies this assay can detect is 250 copies / mL. A negative result does not preclude SARS-CoV-2 infection and should not be used as the sole basis for treatment or other patient management decisions.  A negative result may occur with improper specimen collection / handling, submission of specimen other than nasopharyngeal swab, presence of viral mutation(s) within the areas targeted by this assay, and inadequate number of viral copies (<250 copies / mL). A negative result must be combined with clinical observations, patient history, and epidemiological information.  Fact Sheet for Patients:   StrictlyIdeas.no  Fact Sheet for Healthcare Providers: BankingDealers.co.za  This test is not yet approved or  cleared by the [place] FDA and has been authorized for detection and/or diagnosis of SARS-CoV-2 by FDA under an Emergency Use Authorization (EUA).  This EUA will remain in effect (meaning this test can be used) for the duration of the COVID-19 declaration under Section 564(b)(1) of the Act, 21 U.S.C. section 360bbb-3(b)(1), unless the authorization is terminated or revoked sooner.  Performed at [facility], [address], [place], [place] [address]   MRSA PCR Screening     Status: None   Collection Time: 06/02/20  5:50 AM   Specimen: Nasal Mucosa; Nasopharyngeal  Result Value Ref Range Status   MRSA by PCR NEGATIVE NEGATIVE  Final    Comment:        The GeneXpert MRSA Assay (FDA approved for NASAL specimens only), is one component of a comprehensive MRSA colonization surveillance program. It is not intended to diagnose MRSA infection nor to guide or monitor treatment for MRSA infections. Performed at [facility], [place] [address], [place], [place] [address]   Culture, Urine     Status: Abnormal   Collection Time: 06/03/20 10:13 AM   Specimen: Urine, Random  Result Value Ref Range Status   Specimen Description URINE, RANDOM  Final   Special Requests   Final    NONE Performed at [facility], [place] [address] [place], [place] [address]    Culture >=100,000 COLONIES/mL ENTEROCOCCUS FAECALIS (A)  Final   Report Status 06/05/2020 FINAL  Final   Organism ID, Bacteria ENTEROCOCCUS FAECALIS (A)  Final      Susceptibility   Enterococcus faecalis - MIC*    AMPICILLIN <=2 SENSITIVE Sensitive     NITROFURANTOIN <=16 SENSITIVE Sensitive     VANCOMYCIN 1 SENSITIVE Sensitive     * >=100,000 COLONIES/mL ENTEROCOCCUS FAECALIS  Culture, blood (Routine X 2) w Reflex to ID Panel     Status: Abnormal   Collection Time: 06/03/20 10:38 AM   Specimen: BLOOD  Result Value Ref Range Status   Specimen Description BLOOD LEFT ANTECUBITAL  Final   Special Requests   Final    BOTTLES DRAWN AEROBIC AND ANAEROBIC Blood Culture adequate volume   Culture  Setup Time   Final    GRAM POSITIVE COCCI IN PAIRS IN CHAINS IN BOTH AEROBIC AND ANAEROBIC BOTTLES CRITICAL RESULT CALLED TO, READ BACK BY AND VERIFIED WITH: J. LEDFORD,PHARMD 0246 06/04/2020 Mena Goes Performed at [facility], [place] [address], [place], [place] [address]    Culture STREPTOCOCCUS GALLOLYTICUS (A)  Final   Report Status 06/05/2020 FINAL  Final   Organism ID, Bacteria STREPTOCOCCUS GALLOLYTICUS  Final      Susceptibility   Streptococcus gallolyticus - MIC*    PENICILLIN 0.12 SENSITIVE Sensitive     CEFTRIAXONE 0.25 SENSITIVE Sensitive      ERYTHROMYCIN <=0.12 SENSITIVE Sensitive     LEVOFLOXACIN 2 SENSITIVE Sensitive     VANCOMYCIN <=0.12 SENSITIVE Sensitive     * STREPTOCOCCUS GALLOLYTICUS  Blood Culture ID Panel (Reflexed)     Status: Abnormal   Collection Time: 06/03/20 10:38 AM  Result Value Ref Range Status   Enterococcus faecalis NOT DETECTED NOT DETECTED Final   Enterococcus Faecium NOT DETECTED NOT DETECTED Final   Listeria monocytogenes NOT DETECTED NOT DETECTED Final   Staphylococcus species NOT DETECTED NOT DETECTED Final   Staphylococcus aureus (BCID) NOT DETECTED NOT DETECTED Final   Staphylococcus epidermidis NOT DETECTED NOT DETECTED Final   Staphylococcus lugdunensis NOT DETECTED NOT DETECTED Final   Streptococcus species DETECTED (A) NOT DETECTED Final    Comment: Not Enterococcus species, Streptococcus agalactiae, Streptococcus pyogenes, or Streptococcus pneumoniae. CRITICAL RESULT CALLED TO, READ BACK BY AND VERIFIED WITH: J. LEDFORD,PHARMD 0246 06/04/2020 T. TYSOR    Streptococcus agalactiae NOT DETECTED NOT DETECTED Final   Streptococcus pneumoniae NOT DETECTED NOT DETECTED Final   Streptococcus pyogenes NOT DETECTED NOT DETECTED Final   A.calcoaceticus-baumannii NOT DETECTED NOT DETECTED Final   Bacteroides fragilis NOT DETECTED NOT DETECTED Final   Enterobacterales NOT DETECTED NOT DETECTED Final   Enterobacter cloacae complex NOT DETECTED NOT DETECTED Final  Escherichia coli NOT DETECTED NOT DETECTED Final   Klebsiella aerogenes NOT DETECTED NOT DETECTED Final   Klebsiella oxytoca NOT DETECTED NOT DETECTED Final   Klebsiella pneumoniae NOT DETECTED NOT DETECTED Final   Proteus species NOT DETECTED NOT DETECTED Final   Salmonella species NOT DETECTED NOT DETECTED Final   Serratia marcescens NOT DETECTED NOT DETECTED Final   Haemophilus influenzae NOT DETECTED NOT DETECTED Final   Neisseria meningitidis NOT DETECTED NOT DETECTED Final   Pseudomonas aeruginosa NOT DETECTED NOT DETECTED Final    Stenotrophomonas maltophilia NOT DETECTED NOT DETECTED Final   Candida albicans NOT DETECTED NOT DETECTED Final   Candida auris NOT DETECTED NOT DETECTED Final   Candida glabrata NOT DETECTED NOT DETECTED Final   Candida krusei NOT DETECTED NOT DETECTED Final   Candida parapsilosis NOT DETECTED NOT DETECTED Final   Candida tropicalis NOT DETECTED NOT DETECTED Final   Cryptococcus neoformans/gattii NOT DETECTED NOT DETECTED Final    Comment: Performed at [facility], [place] [address], [place], [place] [address]  Culture, blood (Routine X 2) w Reflex to ID Panel     Status: Abnormal   Collection Time: 06/03/20 10:45 AM   Specimen: BLOOD LEFT HAND  Result Value Ref Range Status   Specimen Description BLOOD LEFT HAND  Final   Special Requests   Final    BOTTLES DRAWN AEROBIC AND ANAEROBIC Blood Culture results may not be optimal due to an inadequate volume of blood received in culture bottles   Culture  Setup Time   Final    GRAM POSITIVE COCCI IN PAIRS IN CHAINS IN BOTH AEROBIC AND ANAEROBIC BOTTLES CRITICAL VALUE NOTED.  VALUE IS CONSISTENT WITH PREVIOUSLY REPORTED AND CALLED VALUE.    Culture (A)  Final    STREPTOCOCCUS GALLOLYTICUS SUSCEPTIBILITIES PERFORMED ON PREVIOUS CULTURE WITHIN THE LAST 5 DAYS. Performed at [facility], [place] [address], [place], [place] [address]    Report Status 06/05/2020 FINAL  Final  Culture, blood (Routine X 2) w Reflex to ID Panel     Status: None (Preliminary result)   Collection Time: 06/05/20  6:03 AM   Specimen: BLOOD RIGHT ARM  Result Value Ref Range Status   Specimen Description BLOOD RIGHT ARM  Final   Special Requests   Final    BOTTLES DRAWN AEROBIC AND ANAEROBIC Blood Culture adequate volume   Culture   Final    NO GROWTH 3 DAYS Performed at [facility], [place] [address], [place], [place] [address]    Report Status PENDING  Incomplete  Aerobic/Anaerobic Culture (surgical/deep wound)     Status: None (Preliminary result)    Collection Time: 06/05/20  1:16 PM   Specimen: BILE  Result Value Ref Range Status   Specimen Description BILE  Final   Special Requests NONE  Final   Gram Stain   Final    FEW WBC PRESENT,BOTH PMN AND MONONUCLEAR MODERATE GRAM POSITIVE COCCI Performed at [facility], [address] [place],  [address]    Culture   Final    FEW STREPTOCOCCUS GALLOLYTICUS NO ANAEROBES ISOLATED; CULTURE IN PROGRESS FOR 5 DAYS    Report Status PENDING  Incomplete   Organism ID, Bacteria STREPTOCOCCUS GALLOLYTICUS  Final      Susceptibility   Streptococcus gallolyticus - MIC*    PENICILLIN 0.12 SENSITIVE Sensitive     CEFTRIAXONE <=0.12 SENSITIVE Sensitive     ERYTHROMYCIN <=0.12 SENSITIVE Sensitive     LEVOFLOXACIN 2 SENSITIVE Sensitive  VANCOMYCIN <=0.12 SENSITIVE Sensitive     * FEW STREPTOCOCCUS GALLOLYTICUS     Time coordinating discharge: 35 minutes  SIGNED:   Cordelia Poche, MD [facility] 06/08/2020, 11:13 AM

## 2020-06-10 LAB — CULTURE, BLOOD (ROUTINE X 2)
Culture: NO GROWTH
Special Requests: ADEQUATE

## 2020-06-10 LAB — AEROBIC/ANAEROBIC CULTURE W GRAM STAIN (SURGICAL/DEEP WOUND)

## 2020-07-06 ENCOUNTER — Other Ambulatory Visit: Payer: Self-pay | Admitting: Surgery

## 2020-07-17 ENCOUNTER — Ambulatory Visit (HOSPITAL_COMMUNITY)
Admission: RE | Admit: 2020-07-17 | Discharge: 2020-07-17 | Disposition: A | Discharge status: Home / Self Care | Payer: BC Managed Care – PPO | Source: Ambulatory Visit | Attending: Physician Assistant | Admitting: Physician Assistant

## 2020-07-17 ENCOUNTER — Other Ambulatory Visit: Payer: Self-pay

## 2020-07-17 ENCOUNTER — Other Ambulatory Visit (HOSPITAL_COMMUNITY): Payer: Self-pay | Admitting: Physician Assistant

## 2020-07-17 DIAGNOSIS — R1310 Dysphagia, unspecified: Secondary | ICD-10-CM | POA: Diagnosis not present

## 2020-07-17 DIAGNOSIS — I10 Essential (primary) hypertension: Secondary | ICD-10-CM | POA: Insufficient documentation

## 2020-07-17 DIAGNOSIS — Z4803 Encounter for change or removal of drains: Secondary | ICD-10-CM | POA: Insufficient documentation

## 2020-07-17 DIAGNOSIS — K810 Acute cholecystitis: Secondary | ICD-10-CM | POA: Insufficient documentation

## 2020-07-17 DIAGNOSIS — Z79899 Other long term (current) drug therapy: Secondary | ICD-10-CM | POA: Diagnosis not present

## 2020-07-17 DIAGNOSIS — K219 Gastro-esophageal reflux disease without esophagitis: Secondary | ICD-10-CM | POA: Insufficient documentation

## 2020-07-17 DIAGNOSIS — I248 Other forms of acute ischemic heart disease: Secondary | ICD-10-CM | POA: Insufficient documentation

## 2020-07-17 DIAGNOSIS — Z87891 Personal history of nicotine dependence: Secondary | ICD-10-CM | POA: Diagnosis not present

## 2020-07-17 DIAGNOSIS — Z7982 Long term (current) use of aspirin: Secondary | ICD-10-CM | POA: Insufficient documentation

## 2020-07-17 HISTORY — PX: IR EXCHANGE BILIARY DRAIN: IMG6046

## 2020-07-17 MED ORDER — LIDOCAINE HCL (PF) 1 % IJ SOLN
INTRAMUSCULAR | Status: AC
  Filled 2020-07-17: qty 30

## 2020-07-17 MED ORDER — LIDOCAINE HCL (PF) 1 % IJ SOLN
INTRAMUSCULAR | Status: DC | PRN
  Administered 2020-07-17: 5 mL

## 2020-07-17 MED ORDER — IOHEXOL 300 MG/ML  SOLN
50.0000 mL | Freq: Once | INTRAMUSCULAR | Status: AC | PRN
  Administered 2020-07-17: 4 mL

## 2020-07-17 NOTE — H&P (Signed)
Chief Complaint: Acute calculus cholecystitis  Supervising Physician: Dr. Gretta Cool  Patient Status: [name] - Out-pt  History of Present Illness: Phillip Kirk is a 69 y.o. male 69 y.o. male outpatient. History of HTN, presented to the ED at [facility] with abdominal pain and chest pain found to have acute calculus cholecystitis and elevated troponin through to be demand ischemia. IR placed a cholecystotomy tube o 8.31.21 by Dr. Shellia Cleverly. Per notes from [facility] patient was seen in the [place] ED at [facility] for cellulitis around the exit site of cholecystomy tube. Patient presents for cholecystomy tube exchange.    Patient is scheduled for lap cholecystomy in 11.17.21    Past Medical History:  Diagnosis Date  . Abnormal stress test    a. 11/2017 MV: EF 69%, HTN response. Ex time 7:13. Mild inf ischemia-->low risk.  . Alcohol abuse   . BPH (benign prostatic hyperplasia)   . Demand ischemia ([place])    a. 01/2020 in setting of urosepsis/bacteremia-->Echo: EF >55%, mild MR/TR. No evidence of veg.  [name] Dysphagia 07/24/2017  . Essential hypertension, benign 07/24/2017  . GERD (gastroesophageal reflux disease)   . History of bacteremia    a. 01/2020 E. coli and Streptococcus gallolyticus bacteremia in setting of urosepsis.  [name] History of tobacco abuse    a. Quit 1993.  [name] Recurrent UTI    a. 01/2020 Urosepsis and bacteremia    Past Surgical History:  Procedure Laterality Date  . BIOPSY  07/24/2017   Procedure: BIOPSY;  Surgeon: Rogene Houston, MD;  [facility];  Service: Endoscopy;;  gastric esophagus  . BIOPSY  03/01/2020   Procedure: BIOPSY;  Surgeon: Rogene Houston, MD;  [facility];  Service: Endoscopy;;  . CATARACT EXTRACTION    . CATARACT EXTRACTION Left   . COLONOSCOPY N/A 02/15/2020   Procedure: COLONOSCOPY;  Surgeon: Rogene Houston, MD;  [facility];  Service: Endoscopy;  Laterality: N/A;  940  . ESOPHAGEAL BRUSHING  02/15/2020   Procedure:  ESOPHAGEAL BRUSHING;  Surgeon: Rogene Houston, MD;  [facility];  Service: Endoscopy;;  . ESOPHAGEAL DILATION N/A 07/24/2017   Procedure: ESOPHAGEAL DILATION;  Surgeon: Rogene Houston, MD;  [facility];  Service: Endoscopy;  Laterality: N/A;  . ESOPHAGEAL DILATION N/A 11/19/2017   Procedure: ESOPHAGEAL DILATION;  Surgeon: Rogene Houston, MD;  [facility];  Service: Endoscopy;  Laterality: N/A;  . ESOPHAGEAL DILATION N/A 02/15/2020   Procedure: ESOPHAGEAL DILATION;  Surgeon: Rogene Houston, MD;  [facility];  Service: Endoscopy;  Laterality: N/A;  . ESOPHAGEAL DILATION N/A 03/01/2020   Procedure: ESOPHAGEAL DILATION;  Surgeon: Rogene Houston, MD;  [facility];  Service: Endoscopy;  Laterality: N/A;  . ESOPHAGOGASTRODUODENOSCOPY N/A 07/24/2017   Procedure: ESOPHAGOGASTRODUODENOSCOPY (EGD);  Surgeon: Rogene Houston, MD;  [facility];  Service: Endoscopy;  Laterality: N/A;  . ESOPHAGOGASTRODUODENOSCOPY N/A 11/19/2017   Procedure: ESOPHAGOGASTRODUODENOSCOPY (EGD);  Surgeon: Rogene Houston, MD;  [facility];  Service: Endoscopy;  Laterality: N/A;  . ESOPHAGOGASTRODUODENOSCOPY N/A 02/15/2020   Procedure: ESOPHAGOGASTRODUODENOSCOPY (EGD);  Surgeon: Rogene Houston, MD;  [facility];  Service: Endoscopy;  Laterality: N/A;  . ESOPHAGOGASTRODUODENOSCOPY N/A 03/01/2020   Procedure: ESOPHAGOGASTRODUODENOSCOPY (EGD);  Surgeon: Rogene Houston, MD;  [facility];  Service: Endoscopy;  Laterality: N/A;  1255  . IR PATIENT EVAL TECH 0-60 MINS  06/07/2020  . IR PERC  CHOLECYSTOSTOMY  06/05/2020  . ROTATOR CUFF REPAIR Left   . rt eye surgery     trauma    Allergies: Patient has no known allergies.  Medications: Prior to Admission medications   Medication Sig Start Date End Date Taking? Authorizing Provider  acetaminophen (TYLENOL) 500 MG tablet Take 500-1,000 mg by mouth every 6 (six) hours as  needed for moderate pain or headache.     [provider]  amLODipine (NORVASC) 5 MG tablet Take 5 mg by mouth daily.     [provider]  aspirin EC 81 MG tablet Take 1 tablet (81 mg total) by mouth daily. 03/04/20   Rehman, Mechele Dawley, MD  atorvastatin (LIPITOR) 80 MG tablet Take 1 tablet (80 mg total) by mouth daily. 06/09/20   Mariel Aloe, MD  metoprolol tartrate (LOPRESSOR) 25 MG tablet Take 12.5 mg by mouth in the morning and at bedtime. 01/20/20   [provider]  montelukast (SINGULAIR) 10 MG tablet Take 1 tablet (10 mg total) by mouth daily. 12/27/19   Rogene Houston, MD  omeprazole (PRILOSEC) 40 MG capsule Take 40 mg by mouth daily. 12/20/19   [provider]  sodium chloride 0.9 % injection Inject 10 mLs into the vein daily. 06/08/20   Jacqualine Mau, NP  tamsulosin (FLOMAX) 0.4 MG CAPS capsule Take 0.4 mg by mouth daily after breakfast.    [provider]  Triamcinolone Acetonide (TRIAMCINOLONE 0.1 % CREAM : EUCERIN) CREA Apply 1 application topically 2 (two) times daily. Apply to back. 06/08/20   Mariel Aloe, MD  triamcinolone cream (KENALOG) 0.1 % Apply 1 application topically 3 (three) times daily as needed (psoriasis).  01/21/20   [provider]  zolpidem (AMBIEN) 5 MG tablet Take 5 mg by mouth daily.  01/20/20   [provider]     Family History  Problem Relation Age of Onset  . Alzheimer's disease Sister   . Breast cancer Sister   . Breast cancer Sister     Social History   Socioeconomic History  . Marital status: Married    Spouse name: Not on file  . Number of children: Not on file  . Years of education: Not on file  . Highest education level: Not on file  Occupational History  . Not on file  Tobacco Use  . Smoking status: Former Smoker    Packs/day: 1.00    Years: 28.00    Pack years: 28.00    Types: Cigarettes    Quit date: 10/05/1992    Years since quitting: 27.8  . Smokeless tobacco:  Current User    Types: Chew  Vaping Use  . Vaping Use: Never used  Substance and Sexual Activity  . Alcohol use: Yes    Comment: 6 pack of beer q 2 days  . Drug use: No  . Sexual activity: Not on file  Other Topics Concern  . Not on file  Social History Narrative   Has two homes - one just [place] in [place] and one in the [place].  Currently retired and living in [place].   Social Determinants of Health   Financial Resource Strain:   . Difficulty of Paying Living Expenses: Not on file  Food Insecurity:   . Worried About Charity fundraiser in the Last Year: Not on file  . Ran Out of Food in the Last Year: Not on file  Transportation Needs:   . Lack of Transportation (  Medical): Not on file  . Lack of Transportation (Non-Medical): Not on file  Physical Activity:   . Days of Exercise per Week: Not on file  . Minutes of Exercise per Session: Not on file  Stress:   . Feeling of Stress : Not on file  Social Connections:   . Frequency of Communication with Friends and Family: Not on file  . Frequency of Social Gatherings with Friends and Family: Not on file  . Attends Religious Services: Not on file  . Active Member of Clubs or Organizations: Not on file  . Attends Archivist Meetings: Not on file  . Marital Status: Not on file    Review of Systems: A 12 point ROS discussed and pertinent positives are indicated in the HPI above.  All other systems are negative.  Review of Systems  Constitutional: Negative for fever.  HENT: Negative for congestion.   Respiratory: Negative for cough and shortness of breath.   Cardiovascular: Negative for chest pain.  Gastrointestinal: Negative for abdominal pain.  Skin:       Redness around exit site.   Neurological: Negative for headaches.  Psychiatric/Behavioral: Negative for behavioral problems and confusion.    Vital Signs: There were no vitals taken for this visit.  Physical Exam Vitals and  nursing note reviewed.  Constitutional:      Appearance: He is well-developed.  HENT:     Head: Normocephalic.  Pulmonary:     Effort: Pulmonary effort is normal.  Musculoskeletal:        General: Normal range of motion.     Cervical back: Normal range of motion.  Skin:    General: Skin is dry.     Comments: erythema noted around exit site with dry drainage.   Neurological:     Mental Status: He is alert and oriented to person, place, and time.     Imaging: No results found.  Labs:  CBC: Recent Labs    06/04/20 0630 06/05/20 0602 06/06/20 0046 06/07/20 0146  WBC 16.5* 11.1* 8.6 6.7  HGB 13.8 13.6 12.7* 12.6*  HCT 42.4 41.7 38.6* 38.5*  PLT 196 210 203 209    COAGS: Recent Labs    06/01/20 1950  INR 1.0  APTT 27    BMP: Recent Labs    06/04/20 0630 06/05/20 0602 06/06/20 0046 06/07/20 0146  NA 134* 134* 134* 136  K 3.8 3.3* 3.5 3.2*  CL 94* 99 99 102  CO2 29 23 25 26   GLUCOSE 102* 97 97 93  BUN 11 16 9 8   CALCIUM 9.0 8.9 8.4* 8.5*  CREATININE 1.41* 1.14 1.04 0.91  GFRNONAA 50* >60 >60 >60  GFRAA 58* >60 >60 >60    LIVER FUNCTION TESTS: Recent Labs    01/27/20 1122 06/03/20 1033  BILITOT 0.3 1.0  AST 17 18  ALT 14 15  ALKPHOS 85 53  PROT 7.0 6.5  ALBUMIN 3.5 3.1*    Assessment and Plan:  69 y.o. male outpatient. History of HTN, presented to the ED at [facility] with abdominal pain and chest pain found to have acute calculus cholecystitis and elevated troponin through to be demand ischemia. IR placed a cholecystotomy tube o 8.31.21 by Dr. Shellia Cleverly. Per notes from [facility] patient was seen in the [place] ED at [facility] for cellulitis around the exit site of cholecystomy tube. Patient presents for cholecystomy tube exchange.    Patient is scheduled for cholecystomy in 11.17.21   Thank you for this interesting consult.  I greatly enjoyed meeting [employer] and look forward to participating in their care.  A copy of this report was sent to the  requesting provider on this date.  Risks and benefits discussed with the patient including bleeding, infection, damage to adjacent structures, bowel perforation/fistula connection, and sepsis.  All of the patient's questions were answered, patient is agreeable to proceed. Consent signed and in chart.   Electronically Signed: Jacqualine Mau, NP 07/17/2020, 9:56 AM   I spent a total of  15 Minutes   in face to face in clinical consultation, greater than 50% of which was counseling/coordinating care for cholecystomy tube exchange

## 2020-07-17 NOTE — Procedures (Signed)
Interventional Radiology Procedure Note  Procedure: Check/exchange of indwelling cholecystostomy tube  Findings: Please refer to procedural dictation for full description.  Exchange for same 10.2 Fr drainage catheter.  Complications: None immediate  Estimated Blood Loss: <5 mL  Recommendations: Keep drain to bag drainage. Per patient, cholecystectomy planned for a few weeks from now.  Will schedule for 8 week routine check and change should operative plan change.  OK to cancel appointment if surgery successful.  Ruthann Cancer, MD Pager: [phone]

## 2020-08-18 ENCOUNTER — Other Ambulatory Visit (HOSPITAL_COMMUNITY)
Admission: RE | Admit: 2020-08-18 | Discharge: 2020-08-18 | Disposition: A | Discharge status: Home / Self Care | Payer: BC Managed Care – PPO | Source: Ambulatory Visit | Attending: Surgery | Admitting: Surgery

## 2020-08-18 DIAGNOSIS — Z20822 Contact with and (suspected) exposure to covid-19: Secondary | ICD-10-CM | POA: Diagnosis not present

## 2020-08-18 DIAGNOSIS — Z01812 Encounter for preprocedural laboratory examination: Secondary | ICD-10-CM | POA: Insufficient documentation

## 2020-08-18 LAB — SARS CORONAVIRUS 2 (TAT 6-24 HRS): SARS Coronavirus 2: NEGATIVE

## 2020-08-20 NOTE — Progress Notes (Signed)
-------------    SDW INSTRUCTIONS:  Your procedure is scheduled on 08/22/20.  Report to [facility] Main Entrance "A" at 1030 A.M., and check in at the Admitting office.  Call this number if you have problems the morning of surgery: [phone]   Remember: Do not eat after midnight the night before your surgery  You may drink clear liquids until 1000 the morning of your surgery.   Clear liquids allowed are: Water, Non-Citrus Juices (without pulp), Carbonated Beverages, Clear Tea, Black Coffee Only, and Gatorade   Take these medicines the morning of surgery with A SIP OF WATER: acetaminophen (TYLENOL) if needed amLODipine (NORVASC) metoprolol tartrate (LOPRESSOR)  montelukast (SINGULAIR)  omeprazole (PRILOSEC)   Follow your surgeon's instructions on when to stop Aspirin.  If no instructions were given by your surgeon then you will need to call the office to get those instructions.    As of today, STOP taking any Aleve, Naproxen, Ibuprofen, Motrin, Advil, Goody's, BC's, all herbal medications, fish oil, and all vitamins.    The Morning of Surgery  Do not wear jewelry  Do not wear lotions, powders, colognes, or deodorant Men may shave face and neck.  Do not bring valuables to the hospital.  [facility] is not responsible for any belongings or valuables.  If you are a smoker, DO NOT Smoke 24 hours prior to surgery  If you wear a CPAP at night please bring your mask the morning of surgery   Remember that you must have someone to transport you home after your surgery, and remain with you for 24 hours if you are discharged the same day.  Please bring cases for contacts, glasses, hearing aids, dentures or bridgework because it cannot be worn into surgery.   Leave your suitcase in the car.  After surgery it may be brought to your room.  For patients admitted to the hospital, discharge time will be determined by your treatment team.  Patients discharged the day of surgery will  not be allowed to drive home.    Special instructions:   [facility]- Preparing For Surgery  Oral Hygiene is also important to reduce your risk of infection.  Remember - BRUSH YOUR TEETH THE MORNING OF SURGERY WITH YOUR REGULAR TOOTHPASTE  Please follow these instructions carefully.   1. Shower the NIGHT BEFORE SURGERY and the MORNING OF SURGERY with DIAL Soap.   2. Wash thoroughly, paying special attention to the area where your surgery will be performed.  3. Thoroughly rinse your body with warm water from the neck down.  4. Pat yourself dry with a CLEAN TOWEL.  5. Wear CLEAN PAJAMAS to bed the night before surgery  6. Place CLEAN SHEETS on your bed the night of your first shower and DO NOT SLEEP WITH PETS.  7. Wear comfortable clothes the morning of surgery.    Day of Surgery:  Please shower the morning of surgery with the DIAL soap Do not apply any deodorants/lotions. Please wear clean clothes to the hospital/surgery center.   Remember to brush your teeth WITH YOUR REGULAR TOOTHPASTE.   Please read over the following fact sheets that you were given.  Patient denies shortness of breath, fever, cough and chest pain.

## 2020-08-21 ENCOUNTER — Other Ambulatory Visit: Payer: Self-pay

## 2020-08-21 ENCOUNTER — Encounter (HOSPITAL_COMMUNITY): Payer: Self-pay | Admitting: Surgery

## 2020-08-21 MED ORDER — SODIUM CHLORIDE 0.9 % WEIGHT BASED INFUSION: 3.0000 mL/kg/h | INTRAVENOUS | Status: DC

## 2020-08-21 MED ORDER — ASPIRIN 81 MG PO CHEW
81.0000 mg | CHEWABLE_TABLET | ORAL | Status: DC
  Filled 2020-08-21: qty 1

## 2020-08-21 MED ORDER — SODIUM CHLORIDE 0.9 % WEIGHT BASED INFUSION: 1.0000 mL/kg/h | INTRAVENOUS | Status: DC

## 2020-08-21 MED ORDER — SODIUM CHLORIDE 0.9% FLUSH: 3.0000 mL | INTRAVENOUS | Status: DC | PRN

## 2020-08-21 MED ORDER — SODIUM CHLORIDE 0.9 % IV SOLN: 250.0000 mL | INTRAVENOUS | Status: DC | PRN

## 2020-08-21 NOTE — Progress Notes (Signed)
   08/21/20 1248  OBSTRUCTIVE SLEEP APNEA  Have you ever been diagnosed with sleep apnea through a sleep study? No  Do you snore loudly (loud enough to be heard through closed doors)?  1  Do you often feel tired, fatigued, or sleepy during the daytime (such as falling asleep during driving or talking to someone)? 0  Has anyone observed you stop breathing during your sleep? 1  Do you have, or are you being treated for high blood pressure? 1  BMI more than 35 kg/m2? 0  Age > 87 (1-yes) 1  Male Gender (Yes=1) 1  Obstructive Sleep Apnea Score 5  Score 5 or greater  Results sent to PCP

## 2020-08-21 NOTE — Progress Notes (Signed)
Anesthesia Chart Review: Same day workup  Recent admission 8/27-06/08/20 for severe sepsis secondary to strep bacteremia and acute cholecystitis with associated cardiac strain/demand ischemia. Drain was placed by IR for cholecystitis. Pt was evaluated by cardiology during admission due to report of chest pain and elevated troponin.  Per progress note by Dr. Angelena Form 06/06/20, "1.Chest pain/ Elevated troponin: Mild troponin elevation in setting of fever/sepsis. LV function is normal. No recurrent chest pain. Elevated troponin felt to be due to demand ischemia. 2. Acute cholecystitis: s/p drain placement yesterday per IR. Cardiology will sign off. Please call with questions." Previous Myoview in 2019 revealed mild inferior ischemia, felt to be low risk, was managed medically.   Follows with GI for hx of GERD and eosinophilic esophagitis.   Will need DOS labs and eval.   EKG  06/01/20: Sinus rhythm with PACs. Rate 61.  TTE 06/02/20: 1. Left ventricular ejection fraction, by estimation, is 55 to 60%. The  left ventricle has normal function. The left ventricle has no regional  wall motion abnormalities. There is mild left ventricular hypertrophy.  Left ventricular diastolic parameters  were normal.  2. Right ventricular systolic function is normal. The right ventricular  size is normal. There is normal pulmonary artery systolic pressure. The  estimated right ventricular systolic pressure is 15.0 mmHg.  3. The mitral valve is grossly normal. No evidence of mitral valve  regurgitation.  4. The aortic valve is tricuspid. Aortic valve regurgitation is mild.  5. The inferior vena cava is normal in size with greater than 50%  respiratory variability, suggesting right atrial pressure of 3 mmHg.   Nuclear stress 11/06/17:  No diagnostic ST segment changes to indicate ischemia. No chest pain reported. Hypertensive response to exercise. No arrhythmias. Low risk Duke treadmill score of 7.  Moderate sized,  mild intensity, reversible inferior defect consistent with ischemia, possibly RCA distribution.  This is a low risk study.  Nuclear stress EF: 69%.   Wynonia Musty [facility] Short Stay Center/Anesthesiology Phone [phone] 08/21/2020 10:56 AM

## 2020-08-21 NOTE — Anesthesia Preprocedure Evaluation (Addendum)
Anesthesia Evaluation  Patient identified by MRN, date of birth, ID band Patient awake    Reviewed: Allergy & Precautions, NPO status , Patient's Chart, lab work & pertinent test results  Airway Mallampati: I  TM Distance: >3 FB Neck ROM: Full    Dental  (+) Edentulous Upper, Edentulous Lower, Dental Advisory Given Has cuts on both sides of mouth between upper and lower lip from dryness:   Pulmonary former smoker,  Quit smoking 1993, 28 pack year history    Pulmonary exam normal breath sounds clear to auscultation       Cardiovascular hypertension, Pt. on medications and Pt. on home beta blockers Normal cardiovascular exam+ Valvular Problems/Murmurs (mild AI) AI  Rhythm:Regular Rate:Normal  Recent admission 8/27-06/08/20 for severe sepsis secondary to strep bacteremia and acute cholecystitis with associated cardiac strain/demand ischemia. Drain was placed by IR for cholecystitis. Pt was evaluated by cardiology during admission due to report of chest pain and elevated troponin.  Per progress note by Dr. Angelena Form 06/06/20, "1.Chest pain/ Elevated troponin: Mild troponin elevation in setting of fever/sepsis. LV function is normal. No recurrent chest pain. Elevated troponin felt to be due to demand ischemia. 2. Acute cholecystitis: s/p drain placement yesterday per IR. Cardiology will sign off. Please call with questions." Previous Myoview in 2019 revealed mild inferior ischemia, felt to be low risk, was managed medically.     Echo 05/2020: 1. Left ventricular ejection fraction, by estimation, is 55 to 60%. The  left ventricle has normal function. The left ventricle has no regional  wall motion abnormalities. There is mild left ventricular hypertrophy.  Left ventricular diastolic parameters  were normal.  2. Right ventricular systolic function is normal. The right ventricular  size is normal. There is normal pulmonary artery systolic pressure.  The  estimated right ventricular systolic pressure is 79.0 mmHg.  3. The mitral valve is grossly normal. No evidence of mitral valve  regurgitation.  4. The aortic valve is tricuspid. Aortic valve regurgitation is mild.  5. The inferior vena cava is normal in size with greater than 50%  respiratory variability, suggesting right atrial pressure of 3 mmHg.   Stress test 2019:  No diagnostic ST segment changes to indicate ischemia. No chest pain reported. Hypertensive response to exercise. No arrhythmias. Low risk Duke treadmill score of 7.  Moderate sized, mild intensity, reversible inferior defect consistent with ischemia, possibly RCA distribution.  This is a low risk study.  Nuclear stress EF: 69%.   Neuro/Psych negative neurological ROS  negative psych ROS   GI/Hepatic GERD  Medicated and Controlled,(+)     substance abuse (etoh abuse, 35 drinks per week)  alcohol use, Last drink last night, has never withdrawn  Dysphagia  cholecystitis w/ gallstones   Endo/Other  negative endocrine ROS  Renal/GU negative Renal ROS  negative genitourinary   Musculoskeletal negative musculoskeletal ROS (+)   Abdominal   Peds  Hematology negative hematology ROS (+)   Anesthesia Other Findings HOH  Reproductive/Obstetrics negative OB ROS                           Anesthesia Physical Anesthesia Plan  ASA: III  Anesthesia Plan: General   Post-op Pain Management:    Induction: Intravenous  PONV Risk Score and Plan: 2 and Ondansetron, Dexamethasone and Treatment may vary due to age or medical condition  Airway Management Planned: Oral ETT  Additional Equipment: None  Intra-op Plan:   Post-operative Plan: Extubation in  OR  Informed Consent: I have reviewed the patients History and Physical, chart, labs and discussed the procedure including the risks, benefits and alternatives for the proposed anesthesia with the patient or authorized  representative who has indicated his/her understanding and acceptance.     Dental advisory given  Plan Discussed with: CRNA  Anesthesia Plan Comments: (   )      Anesthesia Quick Evaluation

## 2020-08-21 NOTE — Progress Notes (Signed)
Mr. Piechota ask that I speak to his wife, Angie.  Angie reports that Mr. Choate has not had any chest pain or shortness of breath, no s/s of Covid. Patient tested negative for Covid_11/13/21_ and has been in quarantine since that time.  I gave instructions on clear liquids until 1000, Mrs. Ram is unable to pick up Pre- Or Ensure- they leave 3 hours away.

## 2020-08-21 NOTE — Progress Notes (Signed)
Covbisd test negative on 08/18/20.  I asked anesthesia to review chart.

## 2020-08-22 ENCOUNTER — Ambulatory Visit: Admit: 2020-08-22 | Payer: Self-pay | Admitting: Surgery

## 2020-08-22 ENCOUNTER — Other Ambulatory Visit: Payer: Self-pay

## 2020-08-22 ENCOUNTER — Ambulatory Visit (HOSPITAL_COMMUNITY): Payer: BC Managed Care – PPO | Admitting: Physician Assistant

## 2020-08-22 ENCOUNTER — Ambulatory Visit (HOSPITAL_COMMUNITY)
Admission: RE | Admit: 2020-08-22 | Discharge: 2020-08-22 | Disposition: A | Discharge status: Home / Self Care | Payer: BC Managed Care – PPO | Attending: Surgery | Admitting: Surgery

## 2020-08-22 ENCOUNTER — Encounter (HOSPITAL_COMMUNITY): Payer: Self-pay | Admitting: Surgery

## 2020-08-22 ENCOUNTER — Encounter (HOSPITAL_COMMUNITY)
Admission: RE | Disposition: A | Discharge status: Home / Self Care | Payer: Self-pay | Source: Home / Self Care | Attending: Surgery

## 2020-08-22 DIAGNOSIS — Z87891 Personal history of nicotine dependence: Secondary | ICD-10-CM | POA: Diagnosis not present

## 2020-08-22 DIAGNOSIS — K8010 Calculus of gallbladder with chronic cholecystitis without obstruction: Secondary | ICD-10-CM | POA: Diagnosis not present

## 2020-08-22 DIAGNOSIS — Z7982 Long term (current) use of aspirin: Secondary | ICD-10-CM | POA: Diagnosis not present

## 2020-08-22 DIAGNOSIS — Z79899 Other long term (current) drug therapy: Secondary | ICD-10-CM | POA: Insufficient documentation

## 2020-08-22 HISTORY — PX: CHOLECYSTECTOMY: SHX55

## 2020-08-22 LAB — CBC
HCT: 46.3 % (ref 39.0–52.0)
Hemoglobin: 14.9 g/dL (ref 13.0–17.0)
MCH: 28.5 pg (ref 26.0–34.0)
MCHC: 32.2 g/dL (ref 30.0–36.0)
MCV: 88.7 fL (ref 80.0–100.0)
Platelets: 249 10*3/uL (ref 150–400)
RBC: 5.22 MIL/uL (ref 4.22–5.81)
RDW: 14.6 % (ref 11.5–15.5)
WBC: 7.1 10*3/uL (ref 4.0–10.5)
nRBC: 0 % (ref 0.0–0.2)

## 2020-08-22 LAB — BASIC METABOLIC PANEL
Anion gap: 12 (ref 5–15)
BUN: 6 mg/dL — ABNORMAL LOW (ref 8–23)
CO2: 25 mmol/L (ref 22–32)
Calcium: 9.2 mg/dL (ref 8.9–10.3)
Chloride: 99 mmol/L (ref 98–111)
Creatinine, Ser: 0.95 mg/dL (ref 0.61–1.24)
GFR, Estimated: >60 mL/min (ref >60)
Glucose, Bld: 111 mg/dL — ABNORMAL HIGH (ref 70–99)
Potassium: 3.6 mmol/L (ref 3.5–5.1)
Sodium: 136 mmol/L (ref 135–145)

## 2020-08-22 SURGERY — LAPAROSCOPIC CHOLECYSTECTOMY
Anesthesia: General

## 2020-08-22 SURGERY — LAPAROSCOPIC CHOLECYSTECTOMY
Anesthesia: General | Site: Abdomen

## 2020-08-22 MED ORDER — ORAL CARE MOUTH RINSE: 15.0000 mL | Freq: Once | OROMUCOSAL | Status: AC

## 2020-08-22 MED ORDER — LIDOCAINE 2% (20 MG/ML) 5 ML SYRINGE
INTRAMUSCULAR | Status: AC
  Filled 2020-08-22: qty 5

## 2020-08-22 MED ORDER — ENSURE PRE-SURGERY PO LIQD: 296.0000 mL | Freq: Once | ORAL | Status: DC

## 2020-08-22 MED ORDER — CEFAZOLIN SODIUM-DEXTROSE 2-4 GM/100ML-% IV SOLN
2.0000 g | INTRAVENOUS | Status: AC
  Administered 2020-08-22: 2 g via INTRAVENOUS
  Filled 2020-08-22: qty 100

## 2020-08-22 MED ORDER — ONDANSETRON HCL 4 MG/2ML IJ SOLN
INTRAMUSCULAR | Status: AC
  Filled 2020-08-22: qty 2

## 2020-08-22 MED ORDER — ACETAMINOPHEN 500 MG PO TABS: 1000.0000 mg | ORAL_TABLET | Freq: Once | ORAL | Status: DC

## 2020-08-22 MED ORDER — FENTANYL CITRATE (PF) 250 MCG/5ML IJ SOLN
INTRAMUSCULAR | Status: AC
  Filled 2020-08-22: qty 5

## 2020-08-22 MED ORDER — ONDANSETRON 4 MG PO TBDP
4.0000 mg | ORAL_TABLET | Freq: Once | ORAL | Status: AC
  Administered 2020-08-22: 4 mg via ORAL
  Filled 2020-08-22: qty 1

## 2020-08-22 MED ORDER — BUPIVACAINE HCL (PF) 0.25 % IJ SOLN
INTRAMUSCULAR | Status: AC
  Filled 2020-08-22: qty 30

## 2020-08-22 MED ORDER — OXYCODONE HCL 5 MG PO TABS: 5.0000 mg | ORAL_TABLET | Freq: Once | ORAL | Status: AC | PRN

## 2020-08-22 MED ORDER — PHENYLEPHRINE 40 MCG/ML (10ML) SYRINGE FOR IV PUSH (FOR BLOOD PRESSURE SUPPORT)
PREFILLED_SYRINGE | INTRAVENOUS | Status: AC
  Filled 2020-08-22: qty 10

## 2020-08-22 MED ORDER — MIDAZOLAM HCL 2 MG/2ML IJ SOLN
INTRAMUSCULAR | Status: AC
  Filled 2020-08-22: qty 2

## 2020-08-22 MED ORDER — GABAPENTIN 100 MG PO CAPS
100.0000 mg | ORAL_CAPSULE | ORAL | Status: AC
  Administered 2020-08-22: 100 mg via ORAL
  Filled 2020-08-22: qty 1

## 2020-08-22 MED ORDER — ONDANSETRON HCL 4 MG/2ML IJ SOLN
INTRAMUSCULAR | Status: DC | PRN
  Administered 2020-08-22: 4 mg via INTRAVENOUS

## 2020-08-22 MED ORDER — DEXAMETHASONE SODIUM PHOSPHATE 10 MG/ML IJ SOLN
INTRAMUSCULAR | Status: DC | PRN
  Administered 2020-08-22: 10 mg via INTRAVENOUS

## 2020-08-22 MED ORDER — LIDOCAINE 2% (20 MG/ML) 5 ML SYRINGE
INTRAMUSCULAR | Status: DC | PRN
  Administered 2020-08-22: 40 mg via INTRAVENOUS

## 2020-08-22 MED ORDER — SUGAMMADEX SODIUM 500 MG/5ML IV SOLN
INTRAVENOUS | Status: AC
  Filled 2020-08-22: qty 5

## 2020-08-22 MED ORDER — OXYCODONE HCL 5 MG/5ML PO SOLN: 5.0000 mg | Freq: Once | ORAL | Status: AC | PRN

## 2020-08-22 MED ORDER — BUPIVACAINE-EPINEPHRINE (PF) 0.25% -1:200000 IJ SOLN
INTRAMUSCULAR | Status: AC
  Filled 2020-08-22: qty 30

## 2020-08-22 MED ORDER — EPHEDRINE 5 MG/ML INJ
INTRAVENOUS | Status: AC
  Filled 2020-08-22: qty 30

## 2020-08-22 MED ORDER — LIDOCAINE 2% (20 MG/ML) 5 ML SYRINGE
INTRAMUSCULAR | Status: AC
  Filled 2020-08-22: qty 10

## 2020-08-22 MED ORDER — OXYCODONE HCL 5 MG PO TABS
ORAL_TABLET | ORAL | Status: AC
  Administered 2020-08-22: 5 mg via ORAL
  Filled 2020-08-22: qty 1

## 2020-08-22 MED ORDER — 0.9 % SODIUM CHLORIDE (POUR BTL) OPTIME
TOPICAL | Status: DC | PRN
  Administered 2020-08-22: 1000 mL

## 2020-08-22 MED ORDER — LACTATED RINGERS IV SOLN: INTRAVENOUS | Status: DC

## 2020-08-22 MED ORDER — CHLORHEXIDINE GLUCONATE CLOTH 2 % EX PADS: 6.0000 | MEDICATED_PAD | Freq: Once | CUTANEOUS | Status: DC

## 2020-08-22 MED ORDER — ACETAMINOPHEN 500 MG PO TABS
1000.0000 mg | ORAL_TABLET | ORAL | Status: AC
  Administered 2020-08-22: 1000 mg via ORAL
  Filled 2020-08-22: qty 2

## 2020-08-22 MED ORDER — HYDROMORPHONE HCL 1 MG/ML IJ SOLN
INTRAMUSCULAR | Status: AC
  Administered 2020-08-22: 0.5 mg via INTRAVENOUS
  Filled 2020-08-22: qty 1

## 2020-08-22 MED ORDER — FENTANYL CITRATE (PF) 250 MCG/5ML IJ SOLN
INTRAMUSCULAR | Status: DC | PRN
  Administered 2020-08-22: 75 ug via INTRAVENOUS
  Administered 2020-08-22: 50 ug via INTRAVENOUS
  Administered 2020-08-22: 25 ug via INTRAVENOUS
  Administered 2020-08-22 (×2): 50 ug via INTRAVENOUS

## 2020-08-22 MED ORDER — NEOSTIGMINE METHYLSULFATE 3 MG/3ML IV SOSY
PREFILLED_SYRINGE | INTRAVENOUS | Status: AC
  Filled 2020-08-22: qty 3

## 2020-08-22 MED ORDER — DEXAMETHASONE SODIUM PHOSPHATE 10 MG/ML IJ SOLN
INTRAMUSCULAR | Status: AC
  Filled 2020-08-22: qty 1

## 2020-08-22 MED ORDER — ROCURONIUM BROMIDE 10 MG/ML (PF) SYRINGE
PREFILLED_SYRINGE | INTRAVENOUS | Status: AC
  Filled 2020-08-22: qty 10

## 2020-08-22 MED ORDER — PROPOFOL 10 MG/ML IV BOLUS
INTRAVENOUS | Status: AC
  Filled 2020-08-22: qty 20

## 2020-08-22 MED ORDER — CHLORHEXIDINE GLUCONATE 0.12 % MT SOLN
15.0000 mL | Freq: Once | OROMUCOSAL | Status: AC
  Administered 2020-08-22: 15 mL via OROMUCOSAL
  Filled 2020-08-22: qty 15

## 2020-08-22 MED ORDER — SODIUM CHLORIDE 0.9 % IR SOLN
Status: DC | PRN
  Administered 2020-08-22: 1000 mL

## 2020-08-22 MED ORDER — SUGAMMADEX SODIUM 200 MG/2ML IV SOLN
INTRAVENOUS | Status: DC | PRN
  Administered 2020-08-22: 300 mg via INTRAVENOUS

## 2020-08-22 MED ORDER — PROPOFOL 10 MG/ML IV BOLUS
INTRAVENOUS | Status: DC | PRN
  Administered 2020-08-22: 130 mg via INTRAVENOUS

## 2020-08-22 MED ORDER — TRAMADOL HCL 50 MG PO TABS
50.0000 mg | ORAL_TABLET | Freq: Four times a day (QID) | ORAL | 1 refills | Status: AC | PRN
Start: 2020-08-22 — End: ?

## 2020-08-22 MED ORDER — HYDROMORPHONE HCL 1 MG/ML IJ SOLN
0.2500 mg | INTRAMUSCULAR | Status: DC | PRN
  Administered 2020-08-22 (×2): 0.5 mg via INTRAVENOUS

## 2020-08-22 MED ORDER — PROMETHAZINE HCL 25 MG/ML IJ SOLN: 6.2500 mg | INTRAMUSCULAR | Status: DC | PRN

## 2020-08-22 MED ORDER — EPHEDRINE SULFATE-NACL 50-0.9 MG/10ML-% IV SOSY
PREFILLED_SYRINGE | INTRAVENOUS | Status: DC | PRN
  Administered 2020-08-22: 10 mg via INTRAVENOUS

## 2020-08-22 MED ORDER — ROCURONIUM BROMIDE 10 MG/ML (PF) SYRINGE
PREFILLED_SYRINGE | INTRAVENOUS | Status: DC | PRN
  Administered 2020-08-22: 60 mg via INTRAVENOUS

## 2020-08-22 MED ORDER — GLYCOPYRROLATE PF 0.2 MG/ML IJ SOSY
PREFILLED_SYRINGE | INTRAMUSCULAR | Status: AC
  Filled 2020-08-22: qty 1

## 2020-08-22 MED ORDER — ONDANSETRON HCL 4 MG PO TABS
4.0000 mg | ORAL_TABLET | Freq: Once | ORAL | Status: DC
  Filled 2020-08-22 (×2): qty 1

## 2020-08-22 MED ORDER — BUPIVACAINE-EPINEPHRINE 0.25% -1:200000 IJ SOLN
INTRAMUSCULAR | Status: DC | PRN
  Administered 2020-08-22: 20 mL

## 2020-08-22 SURGICAL SUPPLY — 34 items
APPLIER CLIP 5 13 M/L LIGAMAX5 (MISCELLANEOUS) ×2
BNDG ADH 1X3 SHEER STRL LF (GAUZE/BANDAGES/DRESSINGS) ×2 IMPLANT
CANISTER SUCT 3000ML PPV (MISCELLANEOUS) ×2 IMPLANT
CHLORAPREP W/TINT 26 (MISCELLANEOUS) ×2 IMPLANT
CLIP APPLIE 5 13 M/L LIGAMAX5 (MISCELLANEOUS) ×1 IMPLANT
COVER SURGICAL LIGHT HANDLE (MISCELLANEOUS) ×2 IMPLANT
COVER WAND RF STERILE (DRAPES) ×2 IMPLANT
DERMABOND ADVANCED (GAUZE/BANDAGES/DRESSINGS) ×1
DERMABOND ADVANCED .7 DNX12 (GAUZE/BANDAGES/DRESSINGS) ×1 IMPLANT
ELECT REM PT RETURN 9FT ADLT (ELECTROSURGICAL) ×2
ELECTRODE REM PT RTRN 9FT ADLT (ELECTROSURGICAL) ×1 IMPLANT
GLOVE SURG SIGNA 7.5 PF LTX (GLOVE) ×2 IMPLANT
GOWN STRL REUS W/ TWL LRG LVL3 (GOWN DISPOSABLE) ×2 IMPLANT
GOWN STRL REUS W/ TWL XL LVL3 (GOWN DISPOSABLE) ×1 IMPLANT
GOWN STRL REUS W/TWL LRG LVL3 (GOWN DISPOSABLE) ×2
GOWN STRL REUS W/TWL XL LVL3 (GOWN DISPOSABLE) ×1
HEMOSTAT SNOW SURGICEL 2X4 (HEMOSTASIS) ×2 IMPLANT
KIT BASIN OR (CUSTOM PROCEDURE TRAY) ×2 IMPLANT
KIT TURNOVER KIT B (KITS) ×2 IMPLANT
NS IRRIG 1000ML POUR BTL (IV SOLUTION) ×2 IMPLANT
PAD ARMBOARD 7.5X6 YLW CONV (MISCELLANEOUS) ×2 IMPLANT
POUCH SPECIMEN RETRIEVAL 10MM (ENDOMECHANICALS) ×2 IMPLANT
SCISSORS LAP 5X35 DISP (ENDOMECHANICALS) ×2 IMPLANT
SET IRRIG TUBING LAPAROSCOPIC (IRRIGATION / IRRIGATOR) ×2 IMPLANT
SET TUBE SMOKE EVAC HIGH FLOW (TUBING) ×2 IMPLANT
SLEEVE ENDOPATH XCEL 5M (ENDOMECHANICALS) ×4 IMPLANT
SPECIMEN JAR SMALL (MISCELLANEOUS) ×2 IMPLANT
SUT MNCRL AB 4-0 PS2 18 (SUTURE) ×2 IMPLANT
TOWEL GREEN STERILE (TOWEL DISPOSABLE) ×2 IMPLANT
TOWEL GREEN STERILE FF (TOWEL DISPOSABLE) ×2 IMPLANT
TRAY LAPAROSCOPIC MC (CUSTOM PROCEDURE TRAY) ×2 IMPLANT
TROCAR XCEL BLUNT TIP 100MML (ENDOMECHANICALS) ×2 IMPLANT
TROCAR XCEL NON-BLD 5MMX100MML (ENDOMECHANICALS) ×2 IMPLANT
WATER STERILE IRR 1000ML POUR (IV SOLUTION) ×2 IMPLANT

## 2020-08-22 NOTE — Transfer of Care (Signed)
Immediate Anesthesia Transfer of Care Note  Patient: Phillip Kirk  Procedure(s) Performed: LAPAROSCOPIC CHOLECYSTECTOMY (N/A Abdomen)  Patient Location: PACU  Anesthesia Type:General  Level of Consciousness: awake, patient cooperative and responds to stimulation  Airway & Oxygen Therapy: Patient Spontanous Breathing and Patient connected to nasal cannula oxygen  Post-op Assessment: Report given to RN and Post -op Vital signs reviewed and stable  Post vital signs: Reviewed and stable  Last Vitals:  Vitals Value Taken Time  BP 162/78 08/22/20 1425  Temp    Pulse 90 08/22/20 1427  Resp 18 08/22/20 1427  SpO2 100 % 08/22/20 1427  Vitals shown include unvalidated device data.  Last Pain:  Vitals:   08/22/20 1053  TempSrc:   PainSc: 4       Patients Stated Pain Goal: 3 (85/50/15 8682)  Complications: No complications documented.

## 2020-08-22 NOTE — Anesthesia Postprocedure Evaluation (Signed)
Anesthesia Post Note  Patient: Phillip Kirk  Procedure(s) Performed: LAPAROSCOPIC CHOLECYSTECTOMY (N/A Abdomen)     Patient location during evaluation: PACU Anesthesia Type: General Level of consciousness: awake and alert, oriented and patient cooperative Pain management: pain level controlled Vital Signs Assessment: post-procedure vital signs reviewed and stable Respiratory status: spontaneous breathing, nonlabored ventilation and respiratory function stable Cardiovascular status: blood pressure returned to baseline and stable Postop Assessment: no apparent nausea or vomiting Anesthetic complications: no   No complications documented.  Last Vitals:  Vitals:   08/22/20 1455 08/22/20 1510  BP: 118/73 (!) 142/73  Pulse: 74 86  Resp: 16 13  Temp:    SpO2: 95% 95%    Last Pain:  Vitals:   08/22/20 1510  TempSrc:   PainSc: [place]

## 2020-08-22 NOTE — Anesthesia Procedure Notes (Signed)
Procedure Name: Intubation Date/Time: 08/22/2020 1:20 PM Performed by: Verdie Drown, CRNA Pre-anesthesia Checklist: Patient identified, Emergency Drugs available, Suction available and Patient being monitored Patient Re-evaluated:Patient Re-evaluated prior to induction Oxygen Delivery Method: Circle System Utilized Preoxygenation: Pre-oxygenation with 100% oxygen Induction Type: IV induction Ventilation: Mask ventilation without difficulty Laryngoscope Size: Mac and 3 Tube type: Oral Tube size: 7.5 mm Number of attempts: 1 Airway Equipment and Method: Stylet and Oral airway Placement Confirmation: ETT inserted through vocal cords under direct vision,  positive ETCO2 and breath sounds checked- equal and bilateral Secured at: 23 cm Tube secured with: Tape Dental Injury: Teeth and Oropharynx as per pre-operative assessment  Difficulty Due To: Difficulty was anticipated and Difficult Airway- due to dentition Comments: Difficult 2 hand mask with OPA

## 2020-08-22 NOTE — H&P (Signed)
   Cherlynn Perches  Location: [facility] Patient #: [id number] DOB: [birth year] Married / Language: English / Race: White Male   History of Present Illness  The patient is a 69 year old male who presents for evaluation of gall stones. He is here today for a hospital follow-up. He presented with cholecystitis and secondary to multiple comorbidities underwent a percutaneous cholecystostomy tube insertion by interventional radiology. He has since been discharged and is doing well. He has the drain still in place. He will have a reevaluation of the drain by IR. He is eating well and moving his bowels well. He has minimal discomfort and no fevers.  Past Medical History:  Diagnosis Date  . Abnormal stress test    a. 11/2017 MV: EF 69%, HTN response. Ex time 7:13. Mild inf ischemia-->low risk.  . Alcohol abuse   . BPH (benign prostatic hyperplasia)   . Demand ischemia ([place])    a. 01/2020 in setting of urosepsis/bacteremia-->Echo: EF >55%, mild MR/TR. No evidence of veg.  [name] Dysphagia 07/24/2017  . Essential hypertension, benign 07/24/2017  . GERD (gastroesophageal reflux disease)   . History of bacteremia    a. 01/2020 E. coli and Streptococcus gallolyticus bacteremia in setting of urosepsis.  [name] History of tobacco abuse    a. Quit 1993.  [name] Recurrent UTI    a. 01/2020 Urosepsis and bacteremia    Past Surgical Histor Shoulder Surgery  Left.  Diagnostic Studies History (Chanel Teressa Senter, CMA;  Colonoscopy  within last year  Allergies (Chanel Teressa Senter, CMA; No Known Drug Allergies Allergies Reconciled   Medication History  Lipitor (80MG  Tablet, Oral) Active. Sodium Chloride (0.9% Solution, External) Active. Triamcinolone Acetonide (External) Specific strength unknown - Active. Aspirin (81MG  Tablet, Oral) Active. Metoprolol Tartrate (25MG  Tablet, Oral) Active. Flomax (0.4MG  Capsule, Oral) Active. Ambien (5MG  Tablet, Oral) Active. amLODIPine Besylate (5MG   Tablet, Oral) Active. Omeprazole (40MG  Capsule DR, Oral) Active. Singulair (10MG  Tablet, Oral) Active. Tylenol (500MG  Capsule, Oral) Active. Medications Reconciled  Vitals  Weight: 167.13 lb Height: 69in Body Surface Area: 1.91 m Body Mass Index: 24.68 kg/m  Temp.: 97.23F  Pulse: 86 (Regular)        Physical Exam  The physical exam findings are as follows: Note: Today, he looks well on exam  His abdomen is soft and nontender. His drainage draining bile    Assessment & Plan  CHOLECYSTITIS, ACUTE WITH CHOLELITHIASIS (K80.00)  Impression: His drain is now been in just over 4 weeks. I will plan a laparoscopic cholecystectomy  He will keep the drain in until surgery. I again discussed the reasons for surgery with him. We discussed the risks in detail. These include but are not limited to bleeding, infection, the need to convert to an open procedure, bile duct injury, bile leak, injury to other structures, cardiopulmonary issues, postoperative recovery, etc. He understands and agrees to proceed with surgery

## 2020-08-22 NOTE — Discharge Instructions (Signed)
[facility] ______[facility] °LAPAROSCOPIC SURGERY: POST OP INSTRUCTIONS °Always review your discharge instruction sheet given to you by the facility where your surgery was performed. °IF YOU HAVE DISABILITY OR FAMILY LEAVE FORMS, YOU MUST BRING THEM TO THE OFFICE FOR PROCESSING.   °DO NOT GIVE THEM TO YOUR DOCTOR. ° °1. A prescription for pain medication may be given to you upon discharge.  Take your pain medication as prescribed, if needed.  If narcotic pain medicine is not needed, then you may take acetaminophen (Tylenol) or ibuprofen (Advil) as needed. °2. Take your usually prescribed medications unless otherwise directed. °3. If you need a refill on your pain medication, please contact your pharmacy.  They will contact our office to request authorization. Prescriptions will not be filled after 5pm or on week-ends. °4. You should follow a light diet the first few days after arrival home, such as soup and crackers, etc.  Be sure to include lots of fluids daily. °5. Most patients will experience some swelling and bruising in the area of the incisions.  Ice packs will help.  Swelling and bruising can take several days to resolve.  °6. It is common to experience some constipation if taking pain medication after surgery.  Increasing fluid intake and taking a stool softener (such as Colace) will usually help or prevent this problem from occurring.  A mild laxative (Milk of Magnesia or Miralax) should be taken according to package instructions if there are no bowel movements after 48 hours. °7. Unless discharge instructions indicate otherwise, you may remove your bandages 24-48 hours after surgery, and you may shower at that time.  You may have steri-strips (small skin tapes) in place directly over the incision.  These strips should be left on the skin for 7-10 days.  If your surgeon used skin glue on the incision, you may shower in 24 hours.  The glue will flake off over the next 2-3 weeks.  Any sutures or  staples will be removed at the office during your follow-up visit. °8. ACTIVITIES:  You may resume regular (light) daily activities beginning the next day--such as daily self-care, walking, climbing stairs--gradually increasing activities as tolerated.  You may have sexual intercourse when it is comfortable.  Refrain from any heavy lifting or straining until approved by your doctor. °a. You may drive when you are no longer taking prescription pain medication, you can comfortably wear a seatbelt, and you can safely maneuver your car and apply brakes. °b. RETURN TO WORK:  __________________________________________________________ °9. You should see your doctor in the office for a follow-up appointment approximately 2-3 weeks after your surgery.  Make sure that you call for this appointment within a day or two after you arrive home to insure a convenient appointment time. °10. OTHER INSTRUCTIONS:OK TO SHOWER STARTING TOMORROW °11. ICE PACK, TYLENOL, AND IBUPROFEN ALSO FOR PAIN °12. NO LIFTING MORE THAN 15 TO 20 POUNDS FOR 2 WEEKS __________________________________________________________________________________________________________________________ __________________________________________________________________________________________________________________________ °WHEN TO CALL YOUR DOCTOR: °1. Fever over 101.0 °2. Inability to urinate °3. Continued bleeding from incision. °4. Increased pain, redness, or drainage from the incision. °5. Increasing abdominal pain ° °The clinic staff is available to answer your questions during regular business hours.  Please don’t hesitate to call and ask to speak to one of the nurses for clinical concerns.  If you have a medical emergency, go to the nearest emergency room or call 911.  A surgeon from [facility] is always on call at the hospital. °[address],   [address], [place], [place]  [address] ? [address] [address], [place], [place]   [address] °[phone] ?  [phone] ? FAX [phone] °Web site: [email or web address] °

## 2020-08-22 NOTE — Op Note (Signed)
Phillip Kirk 08/22/2020   Pre-op Diagnosis: chronic cholecystisis with gallstones     Post-op Diagnosis: same  Procedure(s): Laparoscopic cholecystectomy  Surgeon(s): Coralie Keens, MD Erroll Luna, MD  Anesthesia: General  Staff:  Circulator: Grace Bushy, RN Relief Scrub: Marijean Heath Scrub Person: Teschner, Burman Foster, CST; Betti Cruz, RN  Estimated Blood Loss: Minimal               Specimens: sent to path  Indications: This is a 69 year old gentleman who presented recently with acute cholecystitis and gallstones.  He was bacteremic at the time and multiple medical issues so decision was made to proceed with a percutaneous cholecystostomy tube placement.  He now presents for cholecystectomy  Findings: The patient had a very small chronically contracted gallbladder consistent with chronic cholecystitis.  There is a large amount of adhesions to the right upper quadrant.  The difficulty of this case necessitated an Pensions consultant  Procedure: Patient brought to operating identifies correct patient.  He is placed upon the operating table general anesthesia was induced.  His abdomen was prepped and draped in usual sterile fashion.  I made a small vertical incision below the umbilicus with a scalpel.  I carried this down to the fascia which was then open with a scalpel.  Hemostat was then used to pass into the peritoneal cavity under direct vision.  I then placed a 5 mm trocar in the patient's epigastrium and 2 more replaced in the right upper quadrant under direct vision.  There is a significant amount of adhesions of the omentum and colon to the right upper quadrant.  I was able to bluntly free up the omentum off of the drain site which was coming through the liver.  I was also able to free the colon out of the right upper quadrant as well.  I could not identify the chronically contracted gallbladder.  I freed up further omentum from the gallbladder both  bluntly and with electrocautery.  The gallbladder was very small and quite contracted.  I was able to dissect the gallbladder from the liver most of my laterally and medially and was able to finally identify the base the gallbladder and cystic duct.  Because of the chronic inflammatory process, the cystic artery was fused to the back wall of the cystic duct..  Achieve a critical window around the cystic duct and artery and then clipped both together with 3 surgical clips.  I then transected the cystic duct proximally with the cautery.  The gallbladder then entered and the drain could easily seen coming through the liver and into the gallbladder.  I was able to then completely remove the gallbladder and a large gallstone.  The gallbladder and gallstones were placed in an Endosac and removed through the incision at the umbilicus.  The previous percutaneous cholecystostomy tube was then completely removed to the level of the skin and removed intact.  I placed a piece of surgical snow in the liver bed to aid with hemostasis.  A bleeding vessel in the omentum was then identified and controlled with surgical clips.  I then irrigated the right upper quadrant normal saline.  Again, hemostasis appeared to be achieved.  All ports were then removed under direct vision and the abdomen was deflated.  The 0 Vicryl at the umbilicus was right in place closing the fascial defect.  I reinforced this with another figure-of-eight 0 Vicryl suture.  All incisions were then anesthetized Marcaine and closed with 4-0 Monocryl sutures.  Dermabond was then applied.  The patient tolerated the procedure well.  All the counts were correct at the end of the procedure.  The patient was then extubated in the operating room and taken in a stable condition to the recovery room.          Coralie Keens   Date: 08/22/2020  Time: 2:17 PM

## 2020-08-22 NOTE — Interval H&P Note (Signed)
History and Physical Interval Note: no change in H and P  08/22/2020 12:17 PM  Phillip Kirk  has presented today for surgery, with the diagnosis of cholecystisis with gallstones.  The various methods of treatment have been discussed with the patient and family. After consideration of risks, benefits and other options for treatment, the patient has consented to  Procedure(s): LAPAROSCOPIC, POSSIBLE OPEN CHOLECYSTECTOMY (N/A) as a surgical intervention.  The patient's history has been reviewed, patient examined, no change in status, stable for surgery.  I have reviewed the patient's chart and labs.  Questions were answered to the patient's satisfaction.     Coralie Keens

## 2020-08-23 ENCOUNTER — Encounter (HOSPITAL_COMMUNITY): Payer: Self-pay | Admitting: Surgery

## 2020-08-23 LAB — SURGICAL PATHOLOGY

## 2020-09-11 ENCOUNTER — Inpatient Hospital Stay (HOSPITAL_COMMUNITY): Admission: RE | Admit: 2020-09-11 | Payer: Self-pay | Source: Ambulatory Visit

## 2020-12-24 ENCOUNTER — Telehealth (INDEPENDENT_AMBULATORY_CARE_PROVIDER_SITE_OTHER): Payer: Self-pay | Admitting: *Deleted

## 2020-12-24 NOTE — Telephone Encounter (Signed)
Patient's wife left a message stating that Mr. Phillip Kirk could hardly swallow anything. She also states that she had been told by Dr.Rehman had ask that she call our office when her husband needed to be seen.  I called her back. Left a voicemail stating that Dr. Laural Golden would be in the office tomorrow and I would get this addressed with him. We will call them back.

## 2020-12-25 NOTE — Telephone Encounter (Signed)
Per Dr. Laural Golden - Arrange EGD/ ED with Propofol as soon as possible. I have not contacted his wife yet.

## 2020-12-26 ENCOUNTER — Telehealth (INDEPENDENT_AMBULATORY_CARE_PROVIDER_SITE_OTHER): Payer: Self-pay | Admitting: *Deleted

## 2020-12-26 ENCOUNTER — Other Ambulatory Visit (INDEPENDENT_AMBULATORY_CARE_PROVIDER_SITE_OTHER): Payer: Self-pay

## 2020-12-26 DIAGNOSIS — R1319 Other dysphagia: Secondary | ICD-10-CM

## 2020-12-26 NOTE — Telephone Encounter (Signed)
Noted  

## 2020-12-26 NOTE — Telephone Encounter (Signed)
Patient is scheduled with Dr Laural Golden on 01/09/21

## 2020-12-26 NOTE — Telephone Encounter (Signed)
I called and made Ms. Tesar aware that her question that she ask that I addressed with Dr. Laural Golden. Dr. Laural Golden wanted to precede with EGD/ED . Phillip Kirk will be calling the patient today to arrange the procedure.  Wife asking about a surgery that can be done to permanently take care of swallowing problem. I told her that I would get this addressed with Dr. Laural Golden and we would call her back.

## 2020-12-31 ENCOUNTER — Telehealth (INDEPENDENT_AMBULATORY_CARE_PROVIDER_SITE_OTHER): Payer: Self-pay | Admitting: *Deleted

## 2020-12-31 NOTE — Telephone Encounter (Signed)
I talked with Phillip Kirk. She has provided information for Phillip Kirk's new GI. His insurance will is not in network. His name is Dr. Marquette Saa - Gastroenterologist                     [address]                     [place] , [place] [address]                     Fax Number [phone]   I will speak to Dr. Laural Golden about doing the referral to this GI.

## 2021-01-01 NOTE — Telephone Encounter (Signed)
Records have been faxed.

## 2021-01-08 ENCOUNTER — Encounter (HOSPITAL_COMMUNITY): Payer: BC Managed Care – PPO

## 2021-01-08 ENCOUNTER — Other Ambulatory Visit (HOSPITAL_COMMUNITY): Payer: BC Managed Care – PPO

## 2021-01-09 ENCOUNTER — Encounter (HOSPITAL_COMMUNITY): Payer: Self-pay

## 2021-01-09 ENCOUNTER — Ambulatory Visit (HOSPITAL_COMMUNITY): Admit: 2021-01-09 | Payer: BC Managed Care – PPO | Admitting: Internal Medicine

## 2021-01-09 SURGERY — ESOPHAGOGASTRODUODENOSCOPY (EGD) WITH PROPOFOL
Anesthesia: Monitor Anesthesia Care

## 2021-04-30 IMAGING — XA IR CHOLECYSTOSTOMY
1 series · 1 of 1 positions shown · non-contrast
Comparison: none

INDICATION: 69-year-old male with acute calculus cholecystitis. He is currently
not a surgical candidate and requires percutaneous cholecystostomy
tube placement.

[Series 2: fl (-) angio · 1 of 1 slices shown]
[im 1/1]
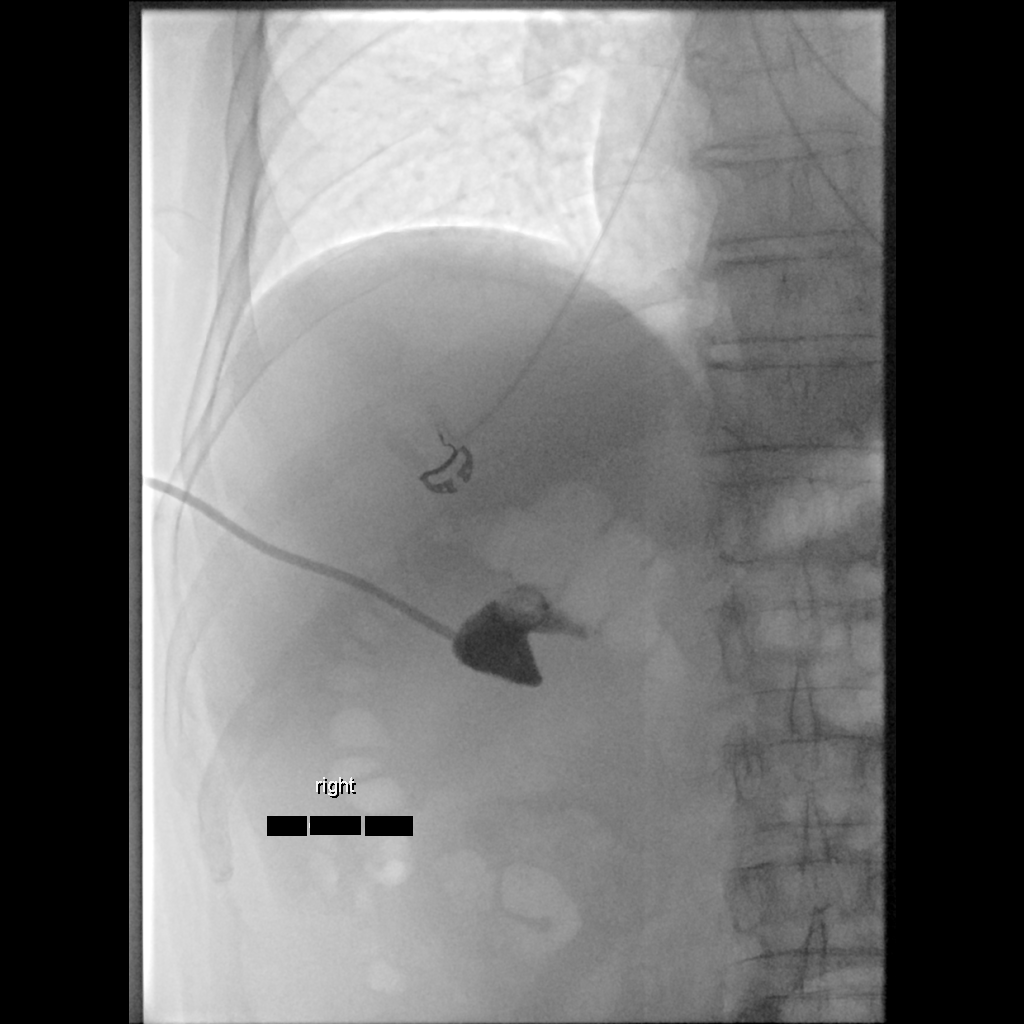

[1 of 1 positions shown; findings below may reference images not displayed]

EXAM:
CHOLECYSTOSTOMY

MEDICATIONS:
In patient currently receiving intravenous antibiotic therapy. No
additional prophylaxis administered.

ANESTHESIA/SEDATION:
Moderate (conscious) sedation was employed during this procedure. A
total of Versed 2 mg and Fentanyl 75 mcg was administered
intravenously.

Moderate Sedation Time: 16 minutes. The patient's level of
consciousness and vital signs were monitored continuously by
radiology nursing throughout the procedure under my direct
supervision.

FLUOROSCOPY TIME:  Fluoroscopy Time: 0 minutes 54 seconds (2 mGy).

COMPLICATIONS:
None immediate.

PROCEDURE:
Informed written consent was obtained from the patient after a
thorough discussion of the procedural risks, benefits and
alternatives. All questions were addressed. Maximal Sterile Barrier
Technique was utilized including caps, mask, sterile gowns, sterile
gloves, sterile drape, hand hygiene and skin antiseptic. A timeout
was performed prior to the initiation of the procedure.

The right upper quadrant was interrogated with ultrasound. The colon
was successfully identified. A suitable skin entry site lateral to
the inter positioned: Was selected. Local anesthesia was attained by
infiltration with 1% lidocaine. A small dermatotomy was made. Under
real time ultrasound guidance, a 21 gauge Accustick needle was
advanced along a short transhepatic course and used to puncture the
gallbladder lumen.

There was return of cloudy bile. A 0.018 inch wire was then coiled
in the gallbladder lumen. The Accustick needle was exchanged for the
transitional sheath which was advanced over the wire and into the
gallbladder lumen. A hand injection of contrast material was
performed opacifying the gallbladder lumen. Filling defects are
present consistent with known cholelithiasis. A 0.035 wire was then
coiled in the gallbladder lumen. The transhepatic tract was dilated
to 10 French and [employer] 10.2 French all-purpose drainage catheter
was advanced over the wire and formed. Aspiration yields 30 mL
turbid bile which was sent for Gram stain and culture. The drainage
catheter was gently flushed and connected to gravity bag for
drainage. The catheter was secured to the skin with 0 Prolene suture
and a sterile bandage was applied. The patient tolerated the
procedure well.
IMPRESSION: Successful placement of a 10 French percutaneous transhepatic
cholecystostomy tube for treatment of acute calculus cholecystitis.

## 2021-06-11 IMAGING — XA IR EXCHANGE BILARY DRAIN
3 series · 6 of 6 positions shown · non-contrast
Comparison: Image guided cholecystostomy tube placement-06/05/2020

INDICATION: History of acute cholecystitis, post ultrasound fluoroscopic guided
cholecystostomy tube placement on 06/05/2020.

EXAM:
FLUOROSCOPIC GUIDED CHOLECYSTOSTOMY TUBE EXCHANGE

[Series 1: fl (-) angio · 2 of 2 slices shown]
[im 1/2]
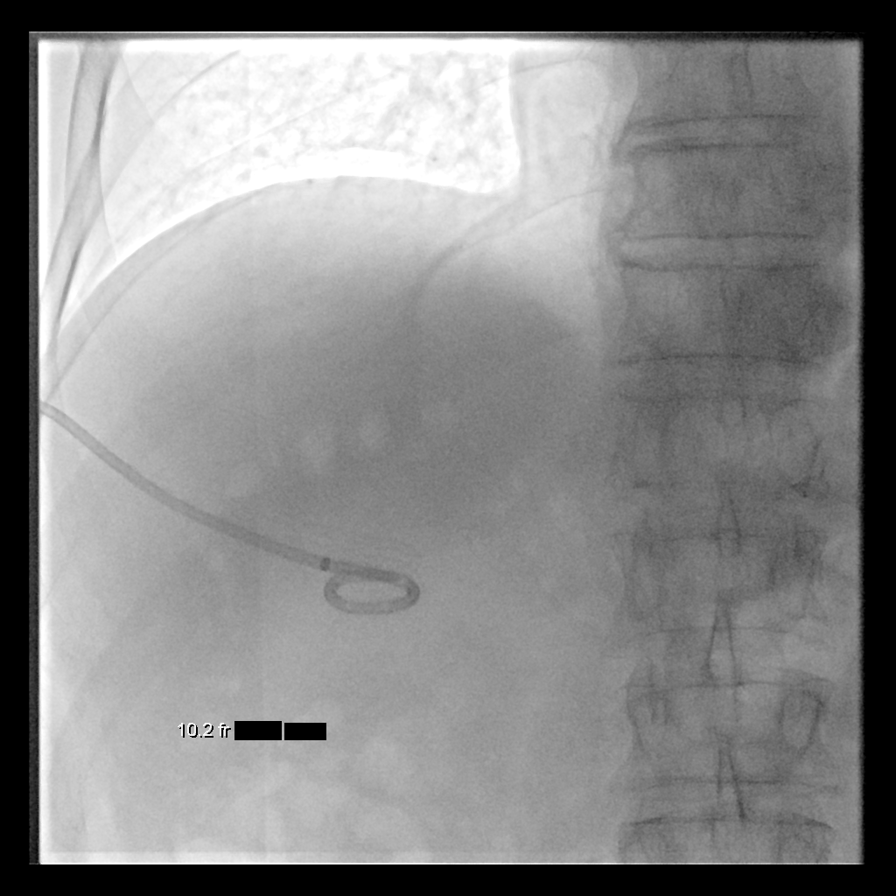
[im 2/2]
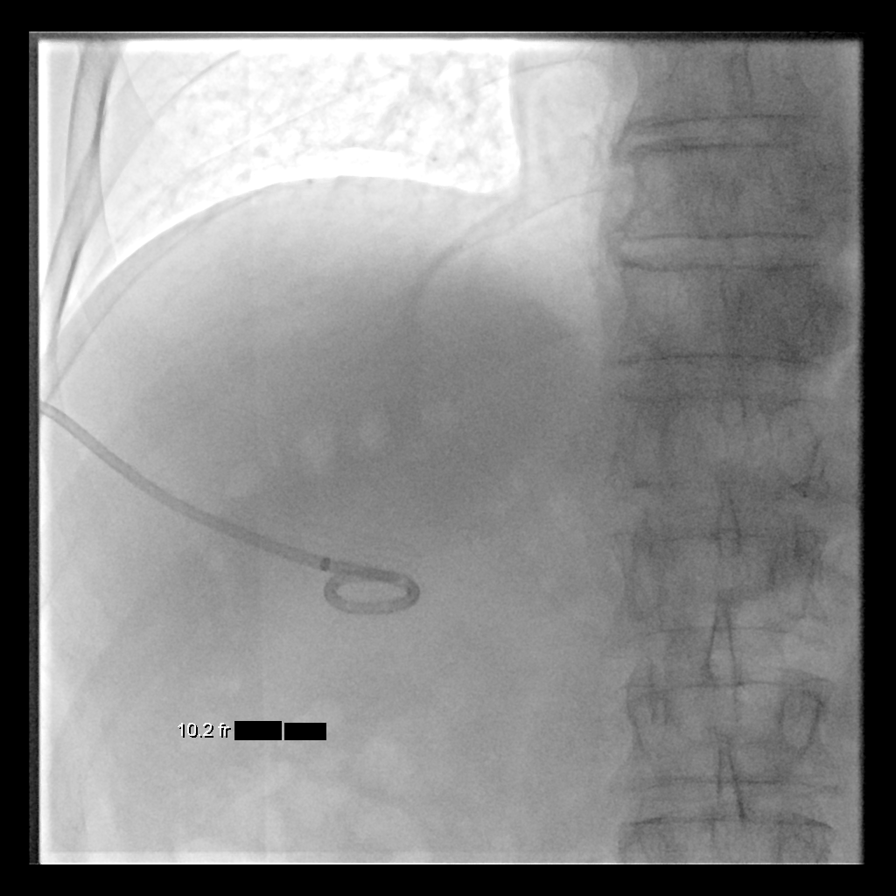

[Series 2: body 4 care · 2 of 2 frames shown (1 of 2)]
[frame 1/2]
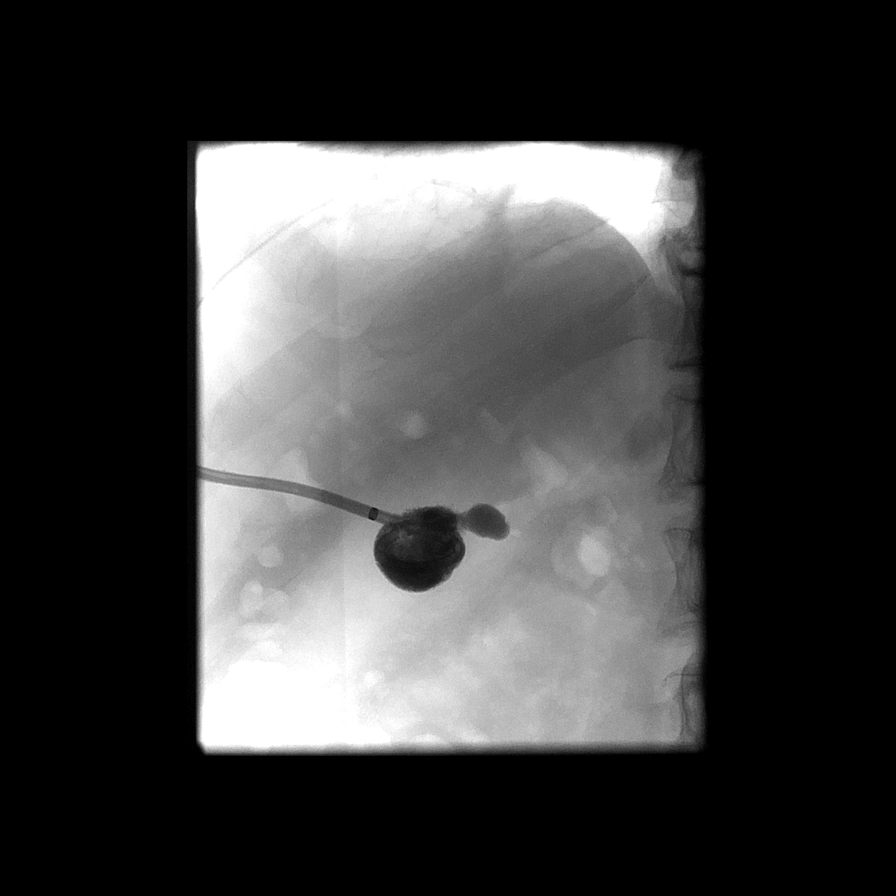
[frame 2/2]
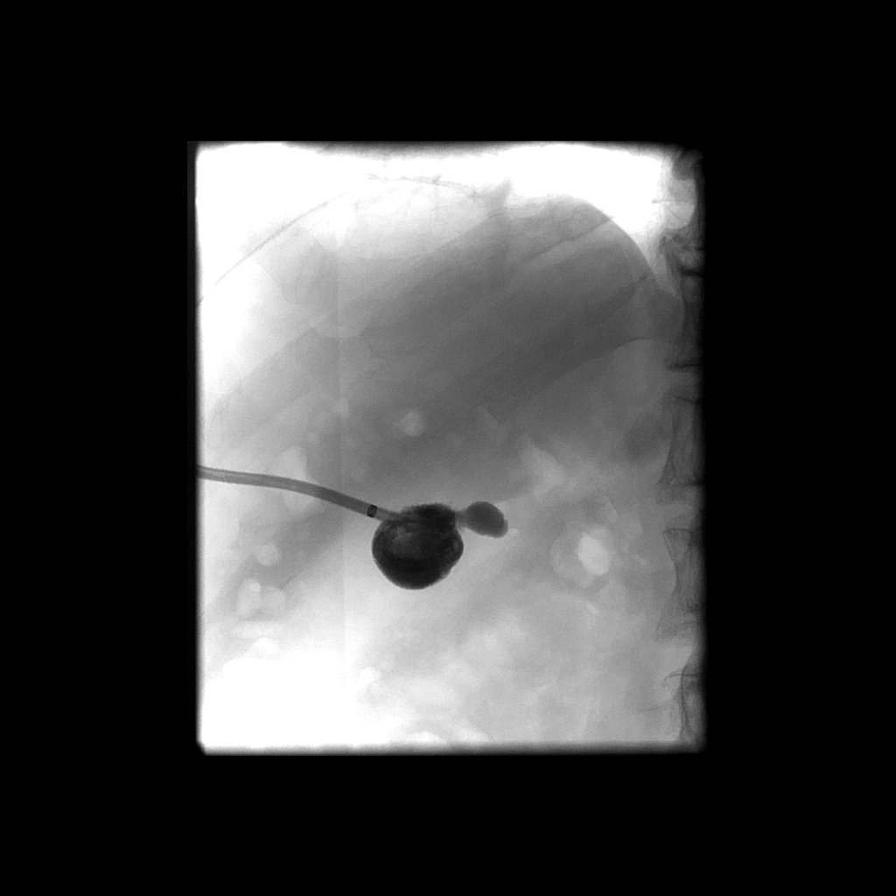

[Series 3: body 4 care · 2 of 2 frames shown (2 of 2)]
[frame 1/2]
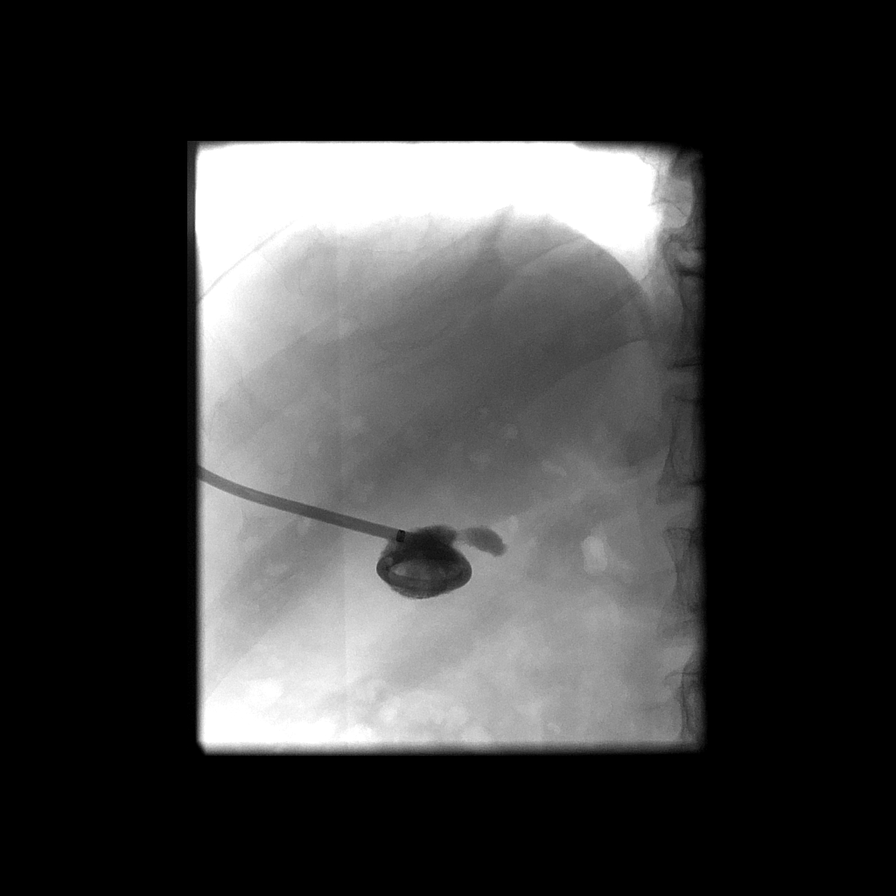
[frame 2/2]
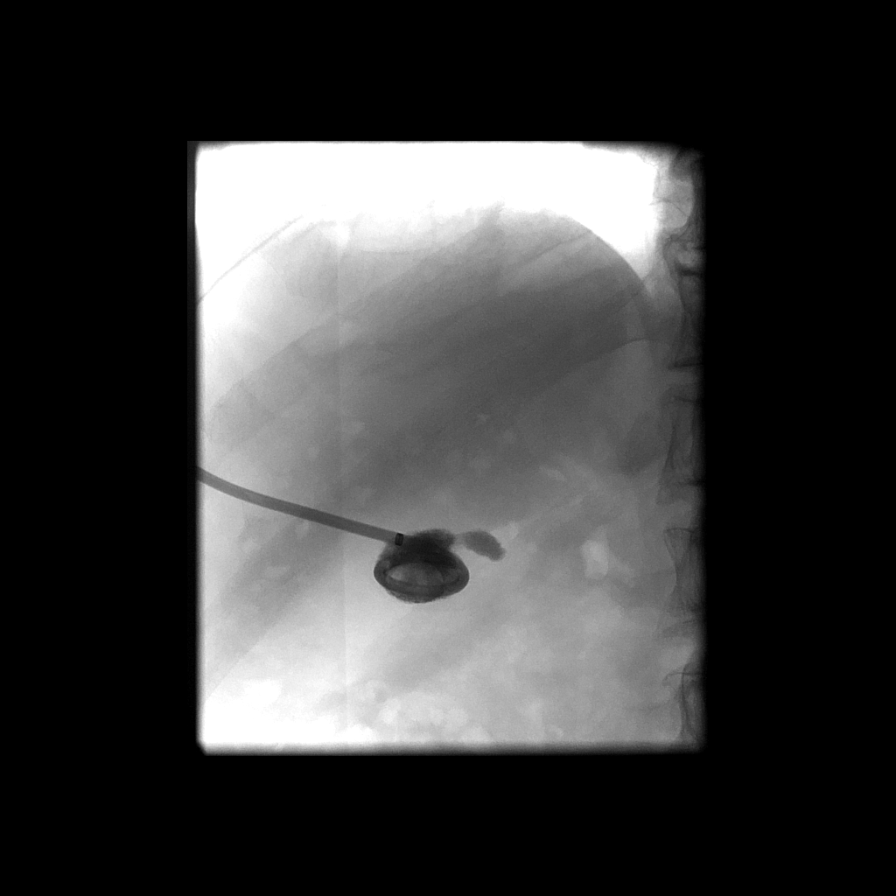

[6 of 6 positions shown; findings below may reference images not displayed]

MEDICATIONS:
None

ANESTHESIA/SEDATION:
None

CONTRAST:  4mL OMNIPAQUE IOHEXOL 300 MG/ML SOLN - administered into
the gallbladder lumen.

FLUOROSCOPY TIME:  1 minutes, 18 seconds, 7 mGy

COMPLICATIONS:
None immediate.

PROCEDURE:
The patient was positioned supine on the fluoroscopy table. The
external portion of the existing cholecystostomy tube as well as the
surrounding skin was prepped and draped in usual sterile fashion. A
time-out was performed prior to the initiation of the procedure.

A preprocedural spot fluoroscopic image was obtained of the right
upper abdominal quadrant existing cholecystostomy tube.

The skin surrounding the cholecystostomy tube was anesthetized with
1% lidocaine with epinephrine.

The external portion of the cholecystostomy tube was cut and
cannulated with a short Amplatz wire which was advanced through the
tube and coiled within the gallbladder lumen

Next, under intermittent fluoroscopic guidance, the existing 10
French cholecystostomy tube was exchanged for a similar catheter.

Contrast injection confirms appropriate positioning and
functionality of the cholecystomy tube.

The cholecystostomy tube was flushed with a small amount of saline
and reconnected to a gravity bag. The cholecystostomy tube was
secured with an interrupted suture and a Stat Lock device. A
dressing was applied.

The patient tolerated the procedure well without immediate
postprocedural complication.
FINDINGS: Preprocedural spot fluoroscopic image demonstrates unchanged
positioning of cholecystostomy tube with end coiled and locked over
the expected location of the fundus of the gallbladder

After fluoroscopic guided exchange, the new, slightly larger, now 12
French cholecystostomy tube is more ideally positioned with end
coiled and locked within the central aspect of the gallbladder
lumen.

Post exchange cholangiogram demonstrates appropriate positioning and
functionality of the new cholecystostomy tube.
IMPRESSION: Successful fluoroscopic guided exchange of indwelling 10.2 French
cholecystostomy tube.

PLAN:
- The patient's cholecystostomy tube was reconnected to a gravity
bag.

- The patient may return to the [REDACTED] in 8 weeks for repeat diagnostic cholangiogram if the drain
remains in place.

The patient knows to call the [REDACTED]
